# Patient Record
Sex: Male | Born: 1955 | Race: White | Hispanic: Yes | Marital: Single | Smoking: Never smoker
Health system: Southern US, Community
[De-identification: ages and names within clinical notes are randomized; demographics above are authoritative.]

---

## 2007-12-04 ENCOUNTER — Emergency Department (HOSPITAL_COMMUNITY): Admission: EM | Admit: 2007-12-04 | Discharge: 2007-12-04 | Payer: Self-pay | Admitting: Emergency Medicine

## 2007-12-07 ENCOUNTER — Inpatient Hospital Stay (HOSPITAL_COMMUNITY): Admission: EM | Admit: 2007-12-07 | Discharge: 2007-12-11 | Payer: Self-pay | Admitting: Emergency Medicine

## 2007-12-07 ENCOUNTER — Ambulatory Visit: Payer: Self-pay | Admitting: *Deleted

## 2007-12-08 ENCOUNTER — Encounter (INDEPENDENT_AMBULATORY_CARE_PROVIDER_SITE_OTHER): Payer: Self-pay | Admitting: *Deleted

## 2011-05-03 NOTE — Discharge Summary (Signed)
Henry [name], Henry [name]                 ACCOUNT NO.:  [id number]   MEDICAL RECORD NO.:  [id number]          PATIENT TYPE:  INP   LOCATION:  5505                         FACILITY:  [facility]   PHYSICIAN:  Manning Charity, MD     DATE OF BIRTH:  [birth year]   DATE OF ADMISSION:  12/07/2007  DATE OF DISCHARGE:  12/11/2007                               DISCHARGE SUMMARY   DISCHARGE DIAGNOSES:  1. Anion gap metabolic acidosis in the setting of alcohol abuse.  2. Hyponatremia in the setting of alcohol abuse.  3. Hypothermia on admission secondary to alcohol abuse and loss-of-      consciousness outdoors.  4. Self-reported history of heavy alcohol abuse.  5. Transient elevation in creatine kinase, resolved, query mild      rhabdomyolysis.  6. Transaminitis.  7. Elevated creatinine kinase level.  8. Acute renal failure, resolved.  9. Melena.  10.Intermittent right leg pain.   DISCHARGE MEDICATIONS:  1. Thiamine 100 mg p.o. daily.  2. Folate 1 mg p.o. daily.   CONSULTATIONS:  None.   PROCEDURE:  A transthoracic echocardiogram on December 08, 2007,  demonstrated vigorous left ventricular ejection fraction of 65%-75%.  There were no left ventricular regional wall motion abnormalities.  Diastolic function parameters were normal.  The left atrium was mildly  to moderately dilated.   HISTORY:  Henry [name] is a 55 year old [place] man with a history of  chronic alcohol abuse, who presented to the [facility]  Emergency Department one day prior to admission on December 07, 2007,  asking for assistance to quit drinking.  He subsequently left against  medical advice and was found to have an alcohol level of 236 at the  time.  He later went home and drank that evening, after which he  developed nausea and vomiting.  It is unclear by the history if the  patient was found by a friend, but he did present to the hospital by  EMS, after being found down.  The patient denies any  hematemesis,  abdominal pain, chest pain or dyspnea on admission.  At the time of  admission he did endorse right calf pain since the morning of admission  with diarrhea x2 days that was watery without blood.  He had no sick  contacts.  Of note, he did endorse drinking much more than usual on the  night prior to admission.  His last drink was reportedly at 2 a.m. the  morning of admission.   ADMISSION PHYSICAL EXAMINATION:  VITAL SIGNS:  On admission temperature  95.7 degrees F, blood pressure 123/83, up from 67/49 at initial  presentation, pulse 165, respirations 22, oxygen saturation 100% on room  air.  GENERAL:  Mild distress and diaphoretic.  HEENT:  Pupils dilated and minimally reactive.  Extraocular movements  intact.  Head atraumatic.  Oropharynx clear with poor dentition.  NECK:  Supple without adenopathy.  LUNGS:  Good air entry bilaterally.  Clear to auscultation.  CARDIOVASCULAR:  Tachycardic with a regular rhythm.  No audible murmurs,  rubs or gallops.  ABDOMEN:  Soft, nontender, nondistended with  normoactive bowel sounds.  EXTREMITIES:  No edema.  No calf tenderness bilaterally.  NEUROLOGIC:  Alert and oriented x3 and grossly nonfocal.  PSYCH:  Appropriate.   ADMISSION LABORATORY DATA:  Lab studies on the day of admission were  sodium 126, potassium 4.4, chloride less than 70, bicarbonate 9, BUN 21,  creatinine 1.38, glucose 139.  Bilirubin 3.4, (direct 0.9, indirect  2.5), alkaline phosphatase 62, AST 187, ALT 108, protein 5.9, albumin  3.3, calcium 9.3.  White blood cell count 9.3 with an ANC of 6.7,  hemoglobin 12.2, platelet count 125, MCV 95.6.  Lactic acid was  extremely elevated at 21.7.  Pro-time was prolonged at 16.5 seconds but  the INR was normal at 1.3.  Cardiac enzymes were remarkable for a first  set revealing a CK of 2043, CK-MB of 31.5, troponin of 0.26.  A repeat  set revealed CK of 3419, CK-MB of 42.5, troponin 0.05.  Amylase was 55.  The first  arterial blood gas revealed a pH of 7.36, pCO2 of 20, pO2 of  76, base deficit of 12.  Urinalysis revealed a pH of 5, with 15 ketones  and moderate blood.  There were 3-6 red blood cells and 0-2 white blood  cells per high power field.   HOSPITAL COURSE:  #1 - Alcohol abuse presenting as hypotension, nausea,  vomiting, diarrhea, metabolic acidosis and abdominal pain:  The patient  had a somewhat confusing initial picture, presenting with clear  hypotension that was very responsive to fluids, as well as nausea,  vomiting, diarrhea and a prominent metabolic acidosis.  The patient did  endorse an alcohol binge on the night prior to admission, so this was  considered the most likely cause of his symptoms; however, sepsis was  also considered, though considered somewhat less likely, given no  supporting symptoms including abdominal pain, fevers or chills and a  normal white blood cell count.  Cardiogenic shock was felt to be  unlikely, without a history of chest pain, and given his relatively low  risk.  Treatment was initially supportive.  The patient was given  copious intravenous fluids, with a good response of his blood pressure.  His metabolic acidosis resolved very quickly with little intervention.  This was felt to be due to lactic acidosis, in the setting of alcohol  intoxication.  He had no clear signs of ischemia, to suggest the lactate  was coming from another source.  Over the course of his admission, the  patient was given a Librium taper empirically instead of a p.r.n. dose,  and responded well.   #2 - Hyponatremia:  Again, this resolved rapidly after admission.  It  was felt to be secondary to the patient's acute intoxication and alcohol  abuse.  This was corrected with intravenous fluids at a reasonable rate.   #3 - Anemia:  Likely cause of the patient's anemia was thought to be due  to bone marrow suppression, secondary to long-term alcohol abuse;  however, the patient did  note an episode of melena around the time of  admission.  He did not have another bloody or black stool during this  hospitalization, so further GI workup was not felt to be necessary as an  inpatient.  His hemoglobin remained stable throughout his  hospitalization.  He was counseled that cessation of alcohol was the  only way to avoid bone marrow suppression, associated with alcohol.   #4 - Electrolyte abnormalities:  As above.  Refeeding syndrome was a  very  real consideration, given the patient's history of alcohol abuse  and poor p.o. intake.  His electrolytes were repleted as necessary,  including primarily potassium and magnesium, but also phosphorus.  His  electrolytes did stabilize during his admission, and he was discharged  without need for supplementation.   #5 - Alcohol abuse:  See problem number one above.  The patient was  continued on a Librium taper throughout this hospitalization.  He was  offered Social Work consultation in Spanish, to assist with alcohol  cessation and local resources.  He was started on thiamine and folate  prior to the institution of dextrose and IV fluids, and was continued on  thiamine and folate at discharge.   #6 - Question of a cardiac murmur:  There was a question after admission  of a possible murmur.  The patient was not aware of a previous history  of cardiac problems.  A 2-D echocardiogram was obtained because of this  possible murmur and was found to be unremarkable.   DISCHARGE LABORATORY DATA:  Laboratory studies on the day of discharge  revealed a sodium of 133, potassium 3.4 prior to repletion, chloride  105, bicarbonate 24, BUN 4, creatinine 0.66, glucose 118, calcium 7.9,  phosphorus 3.6, magnesium 2.1.  White blood cell count 4.4, hemoglobin  9.7 and stable, platelet count 193.   DISCHARGE VITAL SIGNS:  VITAL SIGNS:  On the day of discharge  temperature 98.9 degrees F, blood pressure 101/65, pulse 90,  respirations 22-24,  oxygen saturation 98% on room air.   DISPOSITION/FOLLOWUP:  The patient is to follow up in the [facility].  Given discharge  around the Christmas holiday, the patient was told that he would be  called with an appointment after this was scheduled.  If he had  questions or concerns, he was informed to call #[phone] and speak  with one of the physicians in the outpatient clinic.      Madelaine Etienne, MD  Electronically Signed      Manning Charity, MD  Electronically Signed    JH/MEDQ  D:  02/23/2008  T:  02/23/2008  Job:  [id number]

## 2011-09-20 LAB — BASIC METABOLIC PANEL
BUN: 12
BUN: 2 — ABNORMAL LOW
BUN: 21
BUN: 4 — ABNORMAL LOW
BUN: 5 — ABNORMAL LOW
BUN: 6
BUN: 8
CO2: 21
CO2: 25
CO2: 26
CO2: 9 — CL
Calcium: 6.2 — CL
Calcium: 6.6 — ABNORMAL LOW
Calcium: 7 — ABNORMAL LOW
Calcium: 7.4 — ABNORMAL LOW
Calcium: 7.6 — ABNORMAL LOW
Calcium: 7.7 — ABNORMAL LOW
Calcium: 7.9 — ABNORMAL LOW
Calcium: 7.9 — ABNORMAL LOW
Calcium: 9.3
Chloride: 100
Chloride: 100
Chloride: 105
Chloride: 106
Chloride: 95 — ABNORMAL LOW
Chloride: <70 — CL
Creatinine, Ser: 0.54
Creatinine, Ser: 0.56
Creatinine, Ser: 0.58
Creatinine, Ser: 0.58
Creatinine, Ser: 0.58
Creatinine, Ser: 0.63
Creatinine, Ser: 0.66
GFR calc Af Amer: >60
GFR calc Af Amer: >60
GFR calc Af Amer: >60
GFR calc Af Amer: >60
GFR calc non Af Amer: 55 — ABNORMAL LOW
GFR calc non Af Amer: >60
GFR calc non Af Amer: >60
GFR calc non Af Amer: >60
GFR calc non Af Amer: >60
GFR calc non Af Amer: >60
GFR calc non Af Amer: >60
GFR calc non Af Amer: >60
GFR calc non Af Amer: >60
Glucose, Bld: 102 — ABNORMAL HIGH
Glucose, Bld: 111 — ABNORMAL HIGH
Glucose, Bld: 113 — ABNORMAL HIGH
Glucose, Bld: 117 — ABNORMAL HIGH
Glucose, Bld: 124 — ABNORMAL HIGH
Glucose, Bld: 94
Potassium: 2.6 — CL
Potassium: 2.9 — ABNORMAL LOW
Potassium: 3 — ABNORMAL LOW
Potassium: 3.6
Sodium: 125 — ABNORMAL LOW
Sodium: 129 — ABNORMAL LOW
Sodium: 135

## 2011-09-20 LAB — COMPREHENSIVE METABOLIC PANEL
ALT: 108 — ABNORMAL HIGH
Albumin: 2.1 — ABNORMAL LOW
Alkaline Phosphatase: 47
Alkaline Phosphatase: 53
BUN: 10
CO2: 27
Calcium: 6.6 — ABNORMAL LOW
Chloride: 105
Glucose, Bld: 107 — ABNORMAL HIGH
Glucose, Bld: 96
Potassium: 2.8 — ABNORMAL LOW
Potassium: 3.2 — ABNORMAL LOW
Sodium: 136
Total Bilirubin: 1.9 — ABNORMAL HIGH
Total Protein: 3.9 — ABNORMAL LOW
Total Protein: 4.2 — ABNORMAL LOW

## 2011-09-20 LAB — I-STAT 8, (EC8 V) (CONVERTED LAB)
BUN: 10
Bicarbonate: 14.5 — ABNORMAL LOW
Bicarbonate: 16.2 — ABNORMAL LOW
Chloride: 104
Glucose, Bld: 135 — ABNORMAL HIGH
Hemoglobin: 13.3
Hemoglobin: 18 — ABNORMAL HIGH
Operator id: 198171
Operator id: 288331
Potassium: 3.6
Potassium: 4.2
Sodium: 116 — CL

## 2011-09-20 LAB — CBC
HCT: 22.6 — ABNORMAL LOW
HCT: 22.7 — ABNORMAL LOW
HCT: 23.3 — ABNORMAL LOW
HCT: 47.9
Hemoglobin: 8 — ABNORMAL LOW
Hemoglobin: 8 — ABNORMAL LOW
Hemoglobin: 8.2 — ABNORMAL LOW
MCHC: 34.2
MCHC: 34.7
MCHC: 34.9
MCHC: 35.2
MCV: 92.6
MCV: 93.7
MCV: 94
MCV: 94
MCV: 95.6
MCV: 96.8
Platelets: 143 — ABNORMAL LOW
RBC: 2.41 — ABNORMAL LOW
RBC: 2.43 — ABNORMAL LOW
RBC: 2.51 — ABNORMAL LOW
RBC: 2.94 — ABNORMAL LOW
RBC: 5.17
RDW: 13.2
RDW: 13.6
RDW: 14.1
RDW: 14.2
RDW: 15.1
WBC: 2.9 — ABNORMAL LOW
WBC: 3.5 — ABNORMAL LOW
WBC: 3.7 — ABNORMAL LOW
WBC: 4.4
WBC: 9.3

## 2011-09-20 LAB — CK TOTAL AND CKMB (NOT AT ARMC)
CK, MB: 31.5 — ABNORMAL HIGH
Total CK: 2043 — ABNORMAL HIGH

## 2011-09-20 LAB — HEPATIC FUNCTION PANEL
AST: 187 — ABNORMAL HIGH
Albumin: 3.3 — ABNORMAL LOW
Alkaline Phosphatase: 66
Bilirubin, Direct: 0.9 — ABNORMAL HIGH
Indirect Bilirubin: 2.5 — ABNORMAL HIGH
Total Bilirubin: 3.4 — ABNORMAL HIGH

## 2011-09-20 LAB — DIFFERENTIAL
Basophils Absolute: 0
Basophils Absolute: 0
Basophils Relative: 0
Basophils Relative: 0
Eosinophils Relative: 0
Lymphocytes Relative: 12
Lymphs Abs: 0.8
Lymphs Abs: 2.1
Monocytes Absolute: 0.4
Monocytes Relative: 5
Neutro Abs: 5.9
Neutrophils Relative %: 72

## 2011-09-20 LAB — POCT CARDIAC MARKERS
Operator id: 198171
Troponin i, poc: <0.05

## 2011-09-20 LAB — HIV ANTIBODY (ROUTINE TESTING W REFLEX): HIV: NONREACTIVE

## 2011-09-20 LAB — POCT I-STAT CREATININE
Creatinine, Ser: 0.6
Operator id: 198171

## 2011-09-20 LAB — URINE MICROSCOPIC-ADD ON

## 2011-09-20 LAB — SODIUM, URINE, RANDOM
Sodium, Ur: 41
Sodium, Ur: 44

## 2011-09-20 LAB — POCT I-STAT 3, ART BLOOD GAS (G3+)
Acid-base deficit: 12 — ABNORMAL HIGH
TCO2: 12

## 2011-09-20 LAB — TROPONIN I: Troponin I: 0.26 — ABNORMAL HIGH

## 2011-09-20 LAB — URINALYSIS, ROUTINE W REFLEX MICROSCOPIC
Bilirubin Urine: NEGATIVE
Glucose, UA: 100 — AB
Ketones, ur: 15 — AB
Nitrite: NEGATIVE
Protein, ur: NEGATIVE
Urobilinogen, UA: 1
pH: 5

## 2011-09-20 LAB — PHOSPHORUS
Phosphorus: 1.3 — ABNORMAL LOW
Phosphorus: 1.8 — ABNORMAL LOW
Phosphorus: 3.6

## 2011-09-20 LAB — DIC (DISSEMINATED INTRAVASCULAR COAGULATION)PANEL
D-Dimer, Quant: 3.81 — ABNORMAL HIGH
INR: 1.3
Smear Review: NONE SEEN
aPTT: 28

## 2011-09-20 LAB — CARDIAC PANEL(CRET KIN+CKTOT+MB+TROPI)
CK, MB: 28 — ABNORMAL HIGH
CK, MB: 42.5 — ABNORMAL HIGH
Relative Index: 0.8
Relative Index: 1.2
Total CK: 3418 — ABNORMAL HIGH
Troponin I: 0.05

## 2011-09-20 LAB — ETHANOL: Alcohol, Ethyl (B): 29 — ABNORMAL HIGH

## 2011-09-20 LAB — LACTATE DEHYDROGENASE, ISOENZYMES
LDH 2: 31 %Total (ref 29–42)
LDH 3: 22 %Total (ref 16–22)
LDH Isoenzymes, Total: 371 U/L — ABNORMAL HIGH (ref 105–230)

## 2011-09-20 LAB — OSMOLALITY: Osmolality: 261 — ABNORMAL LOW

## 2011-09-20 LAB — RAPID URINE DRUG SCREEN, HOSP PERFORMED: Tetrahydrocannabinol: NOT DETECTED

## 2011-09-20 LAB — APTT: aPTT: 27

## 2011-09-20 LAB — HAPTOGLOBIN: Haptoglobin: 148

## 2011-09-20 LAB — TYPE AND SCREEN

## 2011-09-20 LAB — HEMOGLOBIN: Hemoglobin: 7.9 — CL

## 2011-09-20 LAB — TECHNOLOGIST SMEAR REVIEW

## 2011-09-20 LAB — MAGNESIUM
Magnesium: 2.1
Magnesium: 2.4
Magnesium: 2.6 — ABNORMAL HIGH

## 2011-09-20 LAB — PROTIME-INR
INR: 1.3
Prothrombin Time: 16.5 — ABNORMAL HIGH

## 2011-09-20 LAB — FERRITIN: Ferritin: 818 — ABNORMAL HIGH (ref 22–322)

## 2011-09-20 LAB — RETICULOCYTES
Retic Count, Absolute: 92.5
Retic Ct Pct: 3.7 — ABNORMAL HIGH

## 2011-09-20 LAB — IRON AND TIBC

## 2011-09-20 LAB — OSMOLALITY, URINE: Osmolality, Ur: 580

## 2011-09-20 LAB — ABO/RH: ABO/RH(D): A POS

## 2011-09-20 LAB — ACETAMINOPHEN LEVEL: Acetaminophen (Tylenol), Serum: <10 — ABNORMAL LOW

## 2011-09-20 LAB — CREATININE, URINE, RANDOM: Creatinine, Urine: 54.4

## 2011-09-20 LAB — FOLATE: Folate: >20

## 2011-09-20 LAB — CHLORIDE, URINE, RANDOM: Chloride Urine: 9

## 2019-12-02 ENCOUNTER — Encounter (HOSPITAL_COMMUNITY): Payer: Self-pay | Admitting: Emergency Medicine

## 2019-12-02 ENCOUNTER — Other Ambulatory Visit: Payer: Self-pay

## 2019-12-02 ENCOUNTER — Inpatient Hospital Stay (HOSPITAL_COMMUNITY)
Admission: EM | Admit: 2019-12-02 | Discharge: 2020-01-17 | DRG: 207 | Disposition: E | Payer: HRSA Program | Attending: Pulmonary Disease | Admitting: Pulmonary Disease

## 2019-12-02 ENCOUNTER — Emergency Department (HOSPITAL_COMMUNITY): Payer: HRSA Program

## 2019-12-02 DIAGNOSIS — J690 Pneumonitis due to inhalation of food and vomit: Secondary | ICD-10-CM | POA: Diagnosis not present

## 2019-12-02 DIAGNOSIS — R140 Abdominal distension (gaseous): Secondary | ICD-10-CM

## 2019-12-02 DIAGNOSIS — R748 Abnormal levels of other serum enzymes: Secondary | ICD-10-CM | POA: Diagnosis present

## 2019-12-02 DIAGNOSIS — T4275XA Adverse effect of unspecified antiepileptic and sedative-hypnotic drugs, initial encounter: Secondary | ICD-10-CM | POA: Diagnosis not present

## 2019-12-02 DIAGNOSIS — E876 Hypokalemia: Secondary | ICD-10-CM

## 2019-12-02 DIAGNOSIS — Z6830 Body mass index (BMI) 30.0-30.9, adult: Secondary | ICD-10-CM | POA: Diagnosis not present

## 2019-12-02 DIAGNOSIS — R739 Hyperglycemia, unspecified: Secondary | ICD-10-CM

## 2019-12-02 DIAGNOSIS — Z789 Other specified health status: Secondary | ICD-10-CM | POA: Diagnosis not present

## 2019-12-02 DIAGNOSIS — J1282 Pneumonia due to coronavirus disease 2019: Secondary | ICD-10-CM | POA: Diagnosis present

## 2019-12-02 DIAGNOSIS — L89322 Pressure ulcer of left buttock, stage 2: Secondary | ICD-10-CM | POA: Diagnosis not present

## 2019-12-02 DIAGNOSIS — E873 Alkalosis: Secondary | ICD-10-CM | POA: Diagnosis present

## 2019-12-02 DIAGNOSIS — J80 Acute respiratory distress syndrome: Secondary | ICD-10-CM | POA: Diagnosis present

## 2019-12-02 DIAGNOSIS — I272 Pulmonary hypertension, unspecified: Secondary | ICD-10-CM | POA: Diagnosis present

## 2019-12-02 DIAGNOSIS — R6521 Severe sepsis with septic shock: Secondary | ICD-10-CM | POA: Diagnosis not present

## 2019-12-02 DIAGNOSIS — E669 Obesity, unspecified: Secondary | ICD-10-CM | POA: Diagnosis present

## 2019-12-02 DIAGNOSIS — U071 COVID-19: Secondary | ICD-10-CM | POA: Diagnosis present

## 2019-12-02 DIAGNOSIS — E11649 Type 2 diabetes mellitus with hypoglycemia without coma: Secondary | ICD-10-CM | POA: Diagnosis not present

## 2019-12-02 DIAGNOSIS — Z6824 Body mass index (BMI) 24.0-24.9, adult: Secondary | ICD-10-CM | POA: Diagnosis not present

## 2019-12-02 DIAGNOSIS — L89312 Pressure ulcer of right buttock, stage 2: Secondary | ICD-10-CM | POA: Diagnosis not present

## 2019-12-02 DIAGNOSIS — E440 Moderate protein-calorie malnutrition: Secondary | ICD-10-CM | POA: Diagnosis present

## 2019-12-02 DIAGNOSIS — T17908A Unspecified foreign body in respiratory tract, part unspecified causing other injury, initial encounter: Secondary | ICD-10-CM

## 2019-12-02 DIAGNOSIS — J9601 Acute respiratory failure with hypoxia: Secondary | ICD-10-CM

## 2019-12-02 DIAGNOSIS — D696 Thrombocytopenia, unspecified: Secondary | ICD-10-CM | POA: Diagnosis not present

## 2019-12-02 DIAGNOSIS — Z66 Do not resuscitate: Secondary | ICD-10-CM | POA: Diagnosis not present

## 2019-12-02 DIAGNOSIS — R040 Epistaxis: Secondary | ICD-10-CM | POA: Diagnosis not present

## 2019-12-02 DIAGNOSIS — J9600 Acute respiratory failure, unspecified whether with hypoxia or hypercapnia: Secondary | ICD-10-CM | POA: Diagnosis not present

## 2019-12-02 DIAGNOSIS — R1310 Dysphagia, unspecified: Secondary | ICD-10-CM | POA: Diagnosis not present

## 2019-12-02 DIAGNOSIS — Z978 Presence of other specified devices: Secondary | ICD-10-CM

## 2019-12-02 DIAGNOSIS — I998 Other disorder of circulatory system: Secondary | ICD-10-CM | POA: Diagnosis not present

## 2019-12-02 DIAGNOSIS — Z01818 Encounter for other preprocedural examination: Secondary | ICD-10-CM

## 2019-12-02 DIAGNOSIS — E162 Hypoglycemia, unspecified: Secondary | ICD-10-CM | POA: Diagnosis not present

## 2019-12-02 DIAGNOSIS — J1289 Other viral pneumonia: Secondary | ICD-10-CM

## 2019-12-02 DIAGNOSIS — E1165 Type 2 diabetes mellitus with hyperglycemia: Secondary | ICD-10-CM | POA: Diagnosis present

## 2019-12-02 DIAGNOSIS — J156 Pneumonia due to other aerobic Gram-negative bacteria: Secondary | ICD-10-CM | POA: Diagnosis not present

## 2019-12-02 DIAGNOSIS — I959 Hypotension, unspecified: Secondary | ICD-10-CM | POA: Diagnosis not present

## 2019-12-02 DIAGNOSIS — IMO0002 Reserved for concepts with insufficient information to code with codable children: Secondary | ICD-10-CM | POA: Diagnosis present

## 2019-12-02 DIAGNOSIS — R34 Anuria and oliguria: Secondary | ICD-10-CM | POA: Diagnosis not present

## 2019-12-02 DIAGNOSIS — Y95 Nosocomial condition: Secondary | ICD-10-CM | POA: Diagnosis not present

## 2019-12-02 DIAGNOSIS — I10 Essential (primary) hypertension: Secondary | ICD-10-CM

## 2019-12-02 DIAGNOSIS — R0902 Hypoxemia: Secondary | ICD-10-CM

## 2019-12-02 DIAGNOSIS — R0602 Shortness of breath: Secondary | ICD-10-CM

## 2019-12-02 DIAGNOSIS — E118 Type 2 diabetes mellitus with unspecified complications: Secondary | ICD-10-CM | POA: Diagnosis not present

## 2019-12-02 DIAGNOSIS — Z23 Encounter for immunization: Secondary | ICD-10-CM

## 2019-12-02 DIAGNOSIS — L8990 Pressure ulcer of unspecified site, unspecified stage: Secondary | ICD-10-CM | POA: Insufficient documentation

## 2019-12-02 DIAGNOSIS — R578 Other shock: Secondary | ICD-10-CM | POA: Diagnosis not present

## 2019-12-02 DIAGNOSIS — Z452 Encounter for adjustment and management of vascular access device: Secondary | ICD-10-CM

## 2019-12-02 DIAGNOSIS — E875 Hyperkalemia: Secondary | ICD-10-CM | POA: Diagnosis not present

## 2019-12-02 DIAGNOSIS — E870 Hyperosmolality and hypernatremia: Secondary | ICD-10-CM | POA: Diagnosis not present

## 2019-12-02 DIAGNOSIS — E872 Acidosis: Secondary | ICD-10-CM | POA: Diagnosis not present

## 2019-12-02 DIAGNOSIS — A419 Sepsis, unspecified organism: Secondary | ICD-10-CM | POA: Diagnosis not present

## 2019-12-02 DIAGNOSIS — J9690 Respiratory failure, unspecified, unspecified whether with hypoxia or hypercapnia: Secondary | ICD-10-CM

## 2019-12-02 DIAGNOSIS — Z79899 Other long term (current) drug therapy: Secondary | ICD-10-CM

## 2019-12-02 DIAGNOSIS — E1169 Type 2 diabetes mellitus with other specified complication: Secondary | ICD-10-CM | POA: Diagnosis not present

## 2019-12-02 LAB — HEMOGLOBIN A1C
Hgb A1c MFr Bld: 7.5 % — ABNORMAL HIGH (ref 4.8–5.6)
Mean Plasma Glucose: 168.55 mg/dL

## 2019-12-02 LAB — LACTIC ACID, PLASMA
Lactic Acid, Venous: 3.2 mmol/L (ref 0.5–1.9)
Lactic Acid, Venous: 2 mmol/L (ref 0.5–1.9)

## 2019-12-02 LAB — COMPREHENSIVE METABOLIC PANEL
ALT: 97 U/L — ABNORMAL HIGH (ref 0–44)
AST: 96 U/L — ABNORMAL HIGH (ref 15–41)
Albumin: 3.1 g/dL — ABNORMAL LOW (ref 3.5–5.0)
Alkaline Phosphatase: 60 U/L (ref 38–126)
Anion gap: 13 (ref 5–15)
BUN: <5 mg/dL — ABNORMAL LOW (ref 8–23)
CO2: 26 mmol/L (ref 22–32)
Calcium: 8 mg/dL — ABNORMAL LOW (ref 8.9–10.3)
Chloride: 98 mmol/L (ref 98–111)
Creatinine, Ser: 0.76 mg/dL (ref 0.61–1.24)
GFR calc Af Amer: >60 mL/min (ref >60)
GFR calc non Af Amer: >60 mL/min (ref >60)
Glucose, Bld: 227 mg/dL — ABNORMAL HIGH (ref 70–99)
Potassium: 3.1 mmol/L — ABNORMAL LOW (ref 3.5–5.1)
Sodium: 137 mmol/L (ref 135–145)
Total Bilirubin: 1 mg/dL (ref 0.3–1.2)
Total Protein: 7.1 g/dL (ref 6.5–8.1)

## 2019-12-02 LAB — FIBRINOGEN: Fibrinogen: 716 mg/dL — ABNORMAL HIGH (ref 210–475)

## 2019-12-02 LAB — CBC WITH DIFFERENTIAL/PLATELET
Abs Immature Granulocytes: 0.05 10*3/uL (ref 0.00–0.07)
Basophils Absolute: 0 10*3/uL (ref 0.0–0.1)
Basophils Relative: 0 %
Eosinophils Absolute: 0 10*3/uL (ref 0.0–0.5)
Eosinophils Relative: 0 %
HCT: 51.2 % (ref 39.0–52.0)
Hemoglobin: 17.5 g/dL — ABNORMAL HIGH (ref 13.0–17.0)
Immature Granulocytes: 1 %
Lymphocytes Relative: 16 %
Lymphs Abs: 1.1 10*3/uL (ref 0.7–4.0)
MCH: 29.2 pg (ref 26.0–34.0)
MCHC: 34.2 g/dL (ref 30.0–36.0)
MCV: 85.3 fL (ref 80.0–100.0)
Monocytes Absolute: 0.2 10*3/uL (ref 0.1–1.0)
Monocytes Relative: 3 %
Neutro Abs: 5.4 10*3/uL (ref 1.7–7.7)
Neutrophils Relative %: 80 %
Platelets: 167 10*3/uL (ref 150–400)
RBC: 6 MIL/uL — ABNORMAL HIGH (ref 4.22–5.81)
RDW: 12.4 % (ref 11.5–15.5)
WBC: 6.8 10*3/uL (ref 4.0–10.5)
nRBC: 0 % (ref 0.0–0.2)

## 2019-12-02 LAB — TRIGLYCERIDES: Triglycerides: 77 mg/dL (ref <150)

## 2019-12-02 LAB — LACTATE DEHYDROGENASE: LDH: 458 U/L — ABNORMAL HIGH (ref 98–192)

## 2019-12-02 LAB — CBG MONITORING, ED
Glucose-Capillary: 158 mg/dL — ABNORMAL HIGH (ref 70–99)
Glucose-Capillary: 198 mg/dL — ABNORMAL HIGH (ref 70–99)

## 2019-12-02 LAB — POC SARS CORONAVIRUS 2 AG -  ED: SARS Coronavirus 2 Ag: POSITIVE — AB

## 2019-12-02 LAB — PROCALCITONIN: Procalcitonin: 0.17 ng/mL

## 2019-12-02 LAB — C-REACTIVE PROTEIN: CRP: 11.6 mg/dL — ABNORMAL HIGH (ref <1.0)

## 2019-12-02 LAB — FERRITIN: Ferritin: 823 ng/mL — ABNORMAL HIGH (ref 24–336)

## 2019-12-02 LAB — D-DIMER, QUANTITATIVE: D-Dimer, Quant: 2.18 ug/mL-FEU — ABNORMAL HIGH (ref 0.00–0.50)

## 2019-12-02 LAB — HIV ANTIBODY (ROUTINE TESTING W REFLEX): HIV Screen 4th Generation wRfx: NONREACTIVE

## 2019-12-02 MED ORDER — SODIUM CHLORIDE 0.9% FLUSH
3.0000 mL | Freq: Once | INTRAVENOUS | Status: AC
  Administered 2019-12-02: 13:00:00 3 mL via INTRAVENOUS

## 2019-12-02 MED ORDER — SODIUM CHLORIDE 0.9 % IV BOLUS
1000.0000 mL | Freq: Once | INTRAVENOUS | Status: AC
  Administered 2019-12-02: 12:00:00 1000 mL via INTRAVENOUS

## 2019-12-02 MED ORDER — ACETAMINOPHEN 325 MG PO TABS
650.0000 mg | ORAL_TABLET | Freq: Four times a day (QID) | ORAL | Status: DC | PRN
  Administered 2019-12-17 – 2020-01-04 (×6): 650 mg via ORAL
  Filled 2019-12-02 (×7): qty 2

## 2019-12-02 MED ORDER — ONDANSETRON HCL 4 MG PO TABS: 4.0000 mg | ORAL_TABLET | Freq: Four times a day (QID) | ORAL | Status: DC | PRN

## 2019-12-02 MED ORDER — ENOXAPARIN SODIUM 40 MG/0.4ML ~~LOC~~ SOLN
40.0000 mg | SUBCUTANEOUS | Status: DC
  Administered 2019-12-02: 40 mg via SUBCUTANEOUS
  Filled 2019-12-02: qty 0.4

## 2019-12-02 MED ORDER — DEXAMETHASONE SODIUM PHOSPHATE 10 MG/ML IJ SOLN
6.0000 mg | INTRAMUSCULAR | Status: DC
  Administered 2019-12-02 – 2019-12-05 (×4): 6 mg via INTRAVENOUS
  Filled 2019-12-02 (×4): qty 1

## 2019-12-02 MED ORDER — SODIUM CHLORIDE 0.9 % IV SOLN
100.0000 mg | Freq: Every day | INTRAVENOUS | Status: AC
  Administered 2019-12-03 – 2019-12-06 (×4): 100 mg via INTRAVENOUS
  Filled 2019-12-02 (×5): qty 20

## 2019-12-02 MED ORDER — SODIUM CHLORIDE 0.9 % IV SOLN
200.0000 mg | Freq: Once | INTRAVENOUS | Status: AC
  Administered 2019-12-02: 16:00:00 200 mg via INTRAVENOUS
  Filled 2019-12-02: qty 200

## 2019-12-02 MED ORDER — ONDANSETRON HCL 4 MG/2ML IJ SOLN: 4.0000 mg | Freq: Four times a day (QID) | INTRAMUSCULAR | Status: DC | PRN

## 2019-12-02 MED ORDER — POTASSIUM CHLORIDE CRYS ER 20 MEQ PO TBCR
40.0000 meq | EXTENDED_RELEASE_TABLET | Freq: Two times a day (BID) | ORAL | Status: AC
  Administered 2019-12-02 (×2): 40 meq via ORAL
  Filled 2019-12-02 (×2): qty 2

## 2019-12-02 NOTE — ED Notes (Signed)
This RN saw pt's 02 sat drop on tele monitor. Went in to assess pt and pt was 69% on 10L [place] with sustained low reading, and good pleth. Maxed out 15L [place] without any improvement. Placed pt on nonrebreather and titrated 02 until pt maxed out on 15L. Pt's 02 sat now 87-89%. Let primary RN know

## 2019-12-02 NOTE — ED Triage Notes (Signed)
Information obtained through use of interpretor phone-- speaks only spanish   Pt to ED with c/o body aches, abd pain, shortness of breath, no appetite,   On room air- sats were 71-74% placed on 4L/O2 /[place] -- sats increased to 86 on 4L/m/[place]

## 2019-12-02 NOTE — H&P (Signed)
Date: 12/01/2019               Patient Name:  Henry [name] MRN: [id number]  DOB: 1956/11/27 Age / Sex: 63 y.o., male   PCP: Patient, No Pcp Per         Medical Service: Internal Medicine Teaching Service         Attending Physician: Dr. Inez Catalina, MD    First Contact: Dr. Sande Brothers Pager: [phone]  Second Contact: Dr. Nedra Hai Pager: [phone]       After Hours (After 5p/  First Contact Pager: [phone]  weekends / holidays): Second Contact Pager: [phone]   Chief Complaint: [facility]  History of Present Illness: Henry [name] is a 63 year old male with no significant past medical history who presented to the emergency department with progressive shortness of breath five days duration. History was obtained with the assistance of a Spanish interpreter and through chart review.  Approximately five days ago the patient noticed fatigue and exertional dyspnea. He then developed a productive cough. His shortness of breath continued to progress from exertional dyspnea to shortness of breath at rest. He then developed myalgias and arthralgias. He states that he was feeling so bad that he had difficulty tolerating PO intake. He did not tried any over-the-counter medications and is not on any prescribed medications. He states that he does not think he has had any exposure to anyone with COVID however he works outside and is constantly around people. He denies headaches, fevers/chills, sinus congestion, chest pain, nausea/vomiting, diarrhea.  Meds: Patient is not on any over-the-counter or prescription medications.  Allergies: Allergies as of 11/20/2019  . (Not on File)   History reviewed. No pertinent past medical history.  Family History: patient denies a family history of hypertension, diabetes, kidney disease, or heart disease.  Social History: Patient lives alone. 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 He does not have any children. He has never been married. He denies the use of  alcohol, tobacco, or illicit substances.  Review of Systems: A complete ROS was negative except as per HPI.   Physical Exam: Blood pressure 135/84, pulse 89, temperature 97.8 F (36.6 C), temperature source Oral, resp. rate (!) 34, SpO2 90 %.  General: Obese male in no acute distress HENT: Normocephalic, atraumatic, moist mucus membranes Pulm: Good air movement with diffuse crackles and using accessory muscles CV: Tachycardic but regular rhythm, no murmurs, no rubs  Abdomen: Active bowel sounds, soft, non-distended, no tenderness to palpation  Extremities: Pulses palpable in all extremities, no LE edema  Skin: Warm and dry  Neuro: Alert and oriented x 3  EKG: personally reviewed my interpretation is sinus rhythm with normal axis and intervals. No acute ST segment changes. No prior EKG for comparison.  Assessment & Plan by Problem: Active Problems:   Acute respiratory failure with hypoxia (HCC)   COVID-19   Hyperglycemia  Henry [name] is a 63 year old male with no significant past medical history who presented to the emergency department with progressive shortness of breath five days duration. He was found to be hypoxic on room air and subsequent testing returned positive for COVID-19. Given his acute hypoxic respiratory failure he was subsequently admitted for further evaluation and management.  Acute hypoxic respiratory failure secondary to COVID-19 - Currently saturating >90% on 4L per minute nasal cannula - Chest x-ray with bilateral opacities/infiltrates and chronically elevated right hemidiaphragm  - Inflammatory markers elevated - Will start Remdesivir and dexamethasone  Hypokalemia  - Replace  - Repeat BMP tomorrow  Hyperglycemia  - Check A1c  - CBG checks   Diet: Regular  VTE ppx: Lovenox  CODE STATUS: Full Code  Dispo: Admit patient to Inpatient with expected length of stay greater than 2 midnights.  SignedIna Homes, MD 12/06/2019, 3:02 PM  Pager:  [phone]

## 2019-12-02 NOTE — ED Notes (Signed)
Spoke with admitting MD about pt's O2 sat being in the low 80's on 6L. MD suggested bumping him up to 8L or higher to get pt's O2 sat in the mid 80's at least. This nurse also notified Josh from Respiratory. Marland Kitchen

## 2019-12-02 NOTE — ED Provider Notes (Addendum)
[place] EMERGENCY DEPARTMENT Provider Note   CSN: [id number] Arrival date & time: December 19, 2019  1000     History Chief Complaint  Patient presents with  . covid symptoms  . Shortness of Breath  . Abdominal Pain    Henry [name] is a 63 y.o. male with no known past medical history presents to emergency department today with chief complaint of shortness of breath x 4 days.  He states his shortness of breath has been constant but is gotten worse over the last 2 days.  He has shortness of breath at rest and with exertion.  He also has productive cough with white phlegm.  He endorses generalized fatigue and abdominal pain.  He is unable to further describe the abdominal pain.  He states he just aches all over. He has decreased PO intake since symptom onset because he feels so poorly.  He has not taken any medications for symptoms prior to arrival. He denies any sick contacts or any contact with someone known positive for COVID-19. However he works outside and is unsure if he has any sick coworkers.  He denies fever, chills, congestion, chest pain, nausea, vomiting, urinary symptoms, diarrhea.  Due to language barrier, a video interpreter was present during the history-taking and subsequent discussion (and for part of the physical exam) with this patient.   History reviewed. No pertinent past medical history.  There are no problems to display for this patient.   History reviewed. No pertinent surgical history.     No family history on file.  Social History   Tobacco Use  . Smoking status: Not on file  Substance Use Topics  . Alcohol use: Not Currently  . Drug use: Never    Home Medications Prior to Admission medications   Not on File    Allergies    Patient has no allergy information on record.  Review of Systems   Review of Systems All other systems are reviewed and are negative for acute change except as noted in the HPI.  Physical Exam Updated Vital  Signs BP 136/89 (BP Location: Left Arm)   Pulse (!) 111   Temp 97.8 F (36.6 C) (Oral)   Resp (!) 30   SpO2 (!) 71% Comment: pt placed on O2   Physical Exam Vitals and nursing note reviewed.  Constitutional:      General: He is in acute distress.     Appearance: He is not ill-appearing or diaphoretic.  HENT:     Head: Normocephalic and atraumatic.     Right Ear: Tympanic membrane and external ear normal.     Left Ear: Tympanic membrane and external ear normal.     Nose: Nose normal.     Mouth/Throat:     Mouth: Mucous membranes are moist.     Pharynx: Oropharynx is clear.  Eyes:     General: No scleral icterus.       Right eye: No discharge.        Left eye: No discharge.     Extraocular Movements: Extraocular movements intact.     Conjunctiva/sclera: Conjunctivae normal.     Pupils: Pupils are equal, round, and reactive to light.  Neck:     Vascular: No JVD.  Cardiovascular:     Rate and Rhythm: Normal rate and regular rhythm.     Pulses: Normal pulses.          Radial pulses are 2+ on the right side and 2+ on the left side.  Heart sounds: Normal heart sounds.  Pulmonary:     Comments: Lung sounds diminished throughout.  SpO2 is 90% on 4 L nasal cannula.  He had acute respiratory distress.  He is speaking in short sentences.  He has accessory muscle use. Abdominal:     General: Bowel sounds are normal.     Comments: Abdomen is soft, non-distended, generalized abdominal tenderness. No rigidity, no guarding. No peritoneal signs.  Normoactive bowel sounds  Musculoskeletal:        General: Normal range of motion.     Cervical back: Normal range of motion.  Skin:    General: Skin is warm and dry.     Capillary Refill: Capillary refill takes less than 2 seconds.  Neurological:     Mental Status: He is oriented to person, place, and time.     GCS: GCS eye subscore is 4. GCS verbal subscore is 5. GCS motor subscore is 6.     Comments: Fluent speech, no facial droop.    Psychiatric:        Behavior: Behavior normal.     ED Results / Procedures / Treatments   Labs (all labs ordered are listed, but only abnormal results are displayed) Labs Reviewed  LACTIC ACID, PLASMA - Abnormal; Notable for the following components:      Result Value   Lactic Acid, Venous 3.2 (*)    All other components within normal limits  LACTIC ACID, PLASMA  COMPREHENSIVE METABOLIC PANEL  CBC WITH DIFFERENTIAL/PLATELET  URINALYSIS, ROUTINE W REFLEX MICROSCOPIC  POC SARS CORONAVIRUS 2 AG -  ED    EKG None  Radiology DG Chest Portable 1 View  Result Date: 02-May-2019 CLINICAL DATA:  COVID symptoms EXAM: PORTABLE CHEST 1 VIEW COMPARISON:  12/07/2007 FINDINGS: The heart size and mediastinal contours are within normal limits. Unchanged elevation of the right hemidiaphragm. Subtle bilateral heterogeneous and interstitial airspace opacity. The visualized skeletal structures are unremarkable. IMPRESSION: 1. Subtle bilateral heterogeneous and interstitial airspace opacity, consistent with multifocal infection and COVID-19 if clinically suspected. 2. Unchanged elevation of the right hemidiaphragm. Electronically Signed   By: Lauralyn PrimesAlex  Bibbey M.D.   On: 02-May-2019 10:55    Procedures .Critical Care Performed by: Sherene SiresAlbrizze, Avalon Coppinger E, PA-C Authorized by: Sherene SiresAlbrizze, Jaymen Fetch E, PA-C   Critical care provider statement:    Critical care time (minutes):  37   Critical care time was exclusive of:  Separately billable procedures and treating other patients and teaching time   Critical care was necessary to treat or prevent imminent or life-threatening deterioration of the following conditions: Acute respiratory failure with hypoxia due to Covid infection.   Critical care was time spent personally by me on the following activities:  Discussions with consultants, development of treatment plan with patient or surrogate, obtaining history from patient or surrogate, examination of patient, evaluation  of patient's response to treatment, ordering and performing treatments and interventions, ordering and review of laboratory studies, ordering and review of radiographic studies, pulse oximetry, re-evaluation of patient's condition, review of old charts and blood draw for specimens   I assumed direction of critical care for this patient from another provider in my specialty: no     (including critical care time)  Medications Ordered in ED Medications  sodium chloride flush (NS) 0.9 % injection 3 mL (has no administration in time range)  sodium chloride 0.9 % bolus 1,000 mL (has no administration in time range)    ED Course  I have reviewed the triage vital signs and  the nursing notes.  Pertinent labs & imaging results that were available during my care of the patient were reviewed by me and considered in my medical decision making (see chart for details).  Clinical Course as of Dec 02 1423  Thu 12/14/2019  1124 IVF started  Lactic Acid, Venous(!!): 3.2 [KA]  1124 Bilateral heterogeneous and interstitial airspace opacity   DG Chest Portable 1 View [KA]  1130 CMP with mild hypokalemia potassium 3.1.  Elevated glucose at 227.  Creatinine is normal, BUN is less than 5.  Elevated liver enzymes.  Normal anion gap.  Comprehensive metabolic panel(!) [KA]  1132 SARS Coronavirus 2 Ag(!): POSITIVE [KA]  1132 No leukocytosis, no anemia.  WBC: 6.8 [KA]    Clinical Course User Index [KA] Heron Pitcock, Caroleen Hamman, PA-C   MDM Rules/Calculators/A&P                     Patient seen and examined upon arrival to ED. He is afebrile. He appears to be in respiratory distress.  He was hypoxic to 70% on room air.  He was placed on 4 L nasal cannula with improvement to 90%.  During my exam his SpO2 ranged from 88 to 91%.  He is speaking in short sentences with accessory muscle use.  He has lung sounds diminished throughout.  He also has generalized abdominal tenderness without peritoneal signs. Labs ordered in  triage.  Significant lab findings as documented in ED course above.  Elevated lactic acid, IVF started. Covid admission labs added on.  I personally viewed x-ray which shows lateral opacities concerning for Covid pneumonia. Patient does not have PCP will plan for unassigned admission for acute respiratory failure with hypoxia due to Covid. Case discussed with internal medicine service who agrees to admit patient to the hospital and assume his care.  They will follow up on Covid admission labs. Findings and plan of care discussed with supervising physician Dr. Rosalia Hammers.   Avonte Raisanen was evaluated in Emergency Department on 12/14/2019 for the symptoms described in the history of present illness. He was evaluated in the context of the global COVID-19 pandemic, which necessitated consideration that the patient might be at risk for infection with the SARS-CoV-2 virus that causes COVID-19. Institutional protocols and algorithms that pertain to the evaluation of patients at risk for COVID-19 are in a state of rapid change based on information released by regulatory bodies including the CDC and federal and state organizations. These policies and algorithms were followed during the patient's care in the ED.   Portions of this note were generated with Scientist, clinical (histocompatibility and immunogenetics). Dictation errors may occur despite best attempts at proofreading.   Final Clinical Impression(s) / ED Diagnoses Final diagnoses:  COVID-19  Acute respiratory failure due to COVID-19 [facility])    Rx / DC Orders ED Discharge Orders    None       Sherene Sires, PA-C 12-14-19 1426    Kathyrn Lass 2019/12/14 Tye Savoy, MD 12/03/19 1212

## 2019-12-02 NOTE — ED Notes (Signed)
Dinner Tray Ordered @ 1819. 

## 2019-12-02 NOTE — Progress Notes (Signed)
Paged by RN and informed patient had desaturated to the 60s at one point and was placed on NRB @ 15L with improvement of O2 sats to high 80s. Patient has had rapid progression of oxygen demands from 4L [place] this morning to 8L HFNC earlier today and now to 15L NRB. Concern for rapidly progressing COVID-19 PNA at risk for requiring ICU level care if ability to oxygenate continues to deteriorate. - Discussed case with E-Link physician and a PCCM provider will evaluate - Continue to maintain saturations >85%, and monitor for symptoms - Nursing to work with patient to help him prone - Continue Remdesivir and dexamethasone

## 2019-12-03 ENCOUNTER — Inpatient Hospital Stay (HOSPITAL_COMMUNITY): Payer: HRSA Program

## 2019-12-03 DIAGNOSIS — J9601 Acute respiratory failure with hypoxia: Secondary | ICD-10-CM | POA: Diagnosis present

## 2019-12-03 DIAGNOSIS — J9600 Acute respiratory failure, unspecified whether with hypoxia or hypercapnia: Secondary | ICD-10-CM

## 2019-12-03 LAB — URINALYSIS, ROUTINE W REFLEX MICROSCOPIC
Bilirubin Urine: NEGATIVE
Glucose, UA: 50 mg/dL — AB
Hgb urine dipstick: NEGATIVE
Ketones, ur: 5 mg/dL — AB
Leukocytes,Ua: NEGATIVE
Nitrite: NEGATIVE
Protein, ur: NEGATIVE mg/dL
Specific Gravity, Urine: 1.009 (ref 1.005–1.030)
pH: 7 (ref 5.0–8.0)

## 2019-12-03 LAB — BASIC METABOLIC PANEL
Anion gap: 12 (ref 5–15)
BUN: 9 mg/dL (ref 8–23)
CO2: 23 mmol/L (ref 22–32)
Calcium: 7.9 mg/dL — ABNORMAL LOW (ref 8.9–10.3)
Chloride: 103 mmol/L (ref 98–111)
Creatinine, Ser: 0.61 mg/dL (ref 0.61–1.24)
GFR calc Af Amer: >60 mL/min (ref >60)
GFR calc non Af Amer: >60 mL/min (ref >60)
Glucose, Bld: 150 mg/dL — ABNORMAL HIGH (ref 70–99)
Potassium: 4.3 mmol/L (ref 3.5–5.1)
Sodium: 138 mmol/L (ref 135–145)

## 2019-12-03 LAB — CBC
HCT: 44 % (ref 39.0–52.0)
Hemoglobin: 15.2 g/dL (ref 13.0–17.0)
MCH: 29.3 pg (ref 26.0–34.0)
MCHC: 34.5 g/dL (ref 30.0–36.0)
MCV: 84.8 fL (ref 80.0–100.0)
Platelets: 167 10*3/uL (ref 150–400)
RBC: 5.19 MIL/uL (ref 4.22–5.81)
RDW: 12.6 % (ref 11.5–15.5)
WBC: 6.1 10*3/uL (ref 4.0–10.5)
nRBC: 0 % (ref 0.0–0.2)

## 2019-12-03 LAB — POCT I-STAT 7, (LYTES, BLD GAS, ICA,H+H)
Acid-Base Excess: 2 mmol/L (ref 0.0–2.0)
Acid-Base Excess: 2 mmol/L (ref 0.0–2.0)
Bicarbonate: 24.3 mmol/L (ref 20.0–28.0)
Bicarbonate: 24.9 mmol/L (ref 20.0–28.0)
Calcium, Ion: 1.09 mmol/L — ABNORMAL LOW (ref 1.15–1.40)
Calcium, Ion: 1.08 mmol/L — ABNORMAL LOW (ref 1.15–1.40)
HCT: 43 % (ref 39.0–52.0)
HCT: 42 % (ref 39.0–52.0)
Hemoglobin: 14.6 g/dL (ref 13.0–17.0)
Hemoglobin: 14.3 g/dL (ref 13.0–17.0)
O2 Saturation: 89 %
O2 Saturation: 91 %
Patient temperature: 97.8
Patient temperature: 97.5
Potassium: 4 mmol/L (ref 3.5–5.1)
Potassium: 4 mmol/L (ref 3.5–5.1)
Sodium: 137 mmol/L (ref 135–145)
Sodium: 140 mmol/L (ref 135–145)
TCO2: 25 mmol/L (ref 22–32)
TCO2: 26 mmol/L (ref 22–32)
pCO2 arterial: 31.8 mmHg — ABNORMAL LOW (ref 32.0–48.0)
pCO2 arterial: 31.3 mmHg — ABNORMAL LOW (ref 32.0–48.0)
pH, Arterial: 7.49 — ABNORMAL HIGH (ref 7.350–7.450)
pH, Arterial: 7.507 — ABNORMAL HIGH (ref 7.350–7.450)
pO2, Arterial: 49 mmHg — ABNORMAL LOW (ref 83.0–108.0)
pO2, Arterial: 52 mmHg — ABNORMAL LOW (ref 83.0–108.0)

## 2019-12-03 LAB — GLUCOSE, CAPILLARY
Glucose-Capillary: 157 mg/dL — ABNORMAL HIGH (ref 70–99)
Glucose-Capillary: 116 mg/dL — ABNORMAL HIGH (ref 70–99)
Glucose-Capillary: 125 mg/dL — ABNORMAL HIGH (ref 70–99)
Glucose-Capillary: 130 mg/dL — ABNORMAL HIGH (ref 70–99)
Glucose-Capillary: 156 mg/dL — ABNORMAL HIGH (ref 70–99)

## 2019-12-03 LAB — CBG MONITORING, ED: Glucose-Capillary: 150 mg/dL — ABNORMAL HIGH (ref 70–99)

## 2019-12-03 LAB — BRAIN NATRIURETIC PEPTIDE: B Natriuretic Peptide: 43.6 pg/mL (ref 0.0–100.0)

## 2019-12-03 LAB — MRSA PCR SCREENING: MRSA by PCR: NEGATIVE

## 2019-12-03 LAB — MAGNESIUM: Magnesium: 2 mg/dL (ref 1.7–2.4)

## 2019-12-03 MED ORDER — SODIUM CHLORIDE 0.9 % IV SOLN
INTRAVENOUS | Status: DC | PRN
  Administered 2019-12-03 – 2019-12-04 (×2): 500 mL via INTRAVENOUS

## 2019-12-03 MED ORDER — CHLORHEXIDINE GLUCONATE CLOTH 2 % EX PADS
6.0000 | MEDICATED_PAD | Freq: Every day | CUTANEOUS | Status: DC
  Administered 2019-12-03: 6 via TOPICAL

## 2019-12-03 MED ORDER — IOHEXOL 350 MG/ML SOLN
80.0000 mL | Freq: Once | INTRAVENOUS | Status: AC | PRN
  Administered 2019-12-03: 80 mL via INTRAVENOUS

## 2019-12-03 MED ORDER — ENOXAPARIN SODIUM 40 MG/0.4ML ~~LOC~~ SOLN: 40.0000 mg | SUBCUTANEOUS | Status: DC

## 2019-12-03 MED ORDER — FUROSEMIDE 10 MG/ML IJ SOLN
40.0000 mg | Freq: Once | INTRAMUSCULAR | Status: AC
  Administered 2019-12-03: 40 mg via INTRAVENOUS
  Filled 2019-12-03: qty 4

## 2019-12-03 MED ORDER — INSULIN ASPART 100 UNIT/ML ~~LOC~~ SOLN
0.0000 [IU] | SUBCUTANEOUS | Status: DC
  Administered 2019-12-03: 12:00:00 2 [IU] via SUBCUTANEOUS
  Administered 2019-12-03 (×2): 3 [IU] via SUBCUTANEOUS
  Administered 2019-12-03 (×2): 2 [IU] via SUBCUTANEOUS
  Administered 2019-12-04: 20:00:00 11 [IU] via SUBCUTANEOUS
  Administered 2019-12-04 (×6): 3 [IU] via SUBCUTANEOUS
  Administered 2019-12-05: 2 [IU] via SUBCUTANEOUS
  Administered 2019-12-05: 8 [IU] via SUBCUTANEOUS
  Administered 2019-12-05 (×2): 3 [IU] via SUBCUTANEOUS

## 2019-12-03 MED ORDER — ENOXAPARIN SODIUM 40 MG/0.4ML ~~LOC~~ SOLN
40.0000 mg | SUBCUTANEOUS | Status: DC
  Administered 2019-12-03 – 2019-12-04 (×2): 40 mg via SUBCUTANEOUS
  Filled 2019-12-03 (×2): qty 0.4

## 2019-12-03 NOTE — Progress Notes (Signed)
Initial Nutrition Assessment  RD working remotely.  DOCUMENTATION CODES:   Not applicable  INTERVENTION:   - Once diet advanced, recommend Regular diet order  - Once diet advanced, Ensure Enlive po TID, each supplement provides 350 kcal and 20 grams of protein  - Encourage adequate PO intake  NUTRITION DIAGNOSIS:   Inadequate oral intake related to inability to eat as evidenced by NPO status.  GOAL:   Patient will meet greater than or equal to 90% of their needs  MONITOR:   Diet advancement, Labs, Weight trends  REASON FOR ASSESSMENT:   Malnutrition Screening Tool    ASSESSMENT:   63 year old male who presented to the ED on 12/17 with SOB and abdominal pain. No known PMH. Pt tested positive for COVID-19.   Pt is NPO at this time. RD will monitor for diet advancement and supplement as appropriate.  Unable to obtain diet and weight history at this time. No weight history available in chart.  Per notes, pt was reporting decreased PO intake PTA due to lack of appetite.  Medications reviewed and include: decadron, lasix once, SSI, remdesivir IVF: NS @ 10 ml/hr  Labs reviewed: hemoglobin A1C 7.5 CBG's: 116-198 x 24 hours  NUTRITION - FOCUSED PHYSICAL EXAM:  Unable to complete at this time. RD working remotely.  Diet Order:   Diet Order            Diet NPO time specified Except for: Sips with Meds  Diet effective now              EDUCATION NEEDS:   No education needs have been identified at this time  Skin:  Skin Assessment: Reviewed RN Assessment  Last BM:  no documented BM  Height:   Ht Readings from Last 1 Encounters:  No data found for Ht    Weight:   Wt Readings from Last 1 Encounters:  12/03/19 79.1 kg    Ideal Body Weight:  (unable to calculate due to no height in chart)  BMI:  There is no height or weight on file to calculate BMI.  Estimated Nutritional Needs:   Kcal:  2200-2400  Protein:  100-120 grams  Fluid:  >/= 2.0  L    Gaynell Face, MS, RD, LDN Inpatient Clinical Dietitian Pager: [phone] Weekend/After Hours: [phone]

## 2019-12-03 NOTE — Progress Notes (Addendum)
Brief Progress Note  S: Awake and alert, following commands  RR < 30 States his breathing is a little better  O: Remains on HFNC at 35 L Min/ NRB, sats are 87% RR 22-25 Afebrile PCT 0.17 Ferritin 823 Lactate 2.0 on 12/17 + 650 Mag 2.0 Able to speak in full sentences, but some dyspnea CTA as noted below ABG 7.50, 31.3, 52,, 26,24.9 at 10 am CTA 12-15-19 Progressive bilateral lung opacities from radiograph yesterday. Multifocal ground-glass opacities with superimposed consolidations in the dependent lower lobes with air bronchograms. Pattern consistent with COVID-19 pneumonia, superimposed bacterial infection in the lower lobes is also considered. Coronary artery calcifications  Examination: General: alert/oriented x 3, follows commands HENT: NCAT, dry MM Lungs: bilateral chest excursion,  Slight increase in WOB , diminished per bases with rhonchi throughout Cardiovascular: RRR, no murmur, no JVD  Abdomen: soft, nontender, normal bowel sounds Extremities: warm, well-perfused without cyanosis, edema, no obvious abnormalities Neuro: grossly nonfocal, follows commands, appropriate  A: Acute Respiratory Failure 2/2 COVID 19 Hemodynamically stable  At present Respiratory alkalosis  Needs  careful monitoring for decompensation   P: Titrate oxygen to maintain sats > 88% Encourage rotating and self proning Aggressive pulmonary toilet IS Q 1 while awake OOB to chair Trend Sats  Trend ABG's prn Trend CXR Continue Decadron and Remdesivir Continue Lovenox Echo per TS LE Dopplers per TS Trend Lactate Maintain net negative fluid balance as able  Lasix 40 x 1 Monitor renal function and electrolytes, replete as needed Leave in ICU for now with TS as primary to monitor for decompensation Re-consult PCCM for decompensation   Magdalen Spatz, MSN, AGACNP-BC [place] Pager # [phone] After 4 pm please call [phone] 12/03/2019 10:44  AM  Attending Note:  63 year old male with COVID 19 hypoxemic respiratory failure.  Transferred to the ICU for increased O2 demand.  O2 demand remains high but patient is very comfortable on exam.  Happy hypoxemic.  I reviewed CXR myself, infiltrate noted.  Discussed with PCCM-NP.  Maintain dry as able.  Continue remdesivir and steroids with lovenox.  Will need additional time to recover.  Will transfer out of the ICU to 5W and back to IMTS with PCCM available as PRN.    The patient is critically ill with multiple organ systems failure and requires high complexity decision making for assessment and support, frequent evaluation and titration of therapies, application of advanced monitoring technologies and extensive interpretation of multiple databases.   Critical Care Time devoted to patient care services described in this note is  45  Minutes. This time reflects time of care of this signee Dr Jennet Maduro. This critical care time does not reflect procedure time, or teaching time or supervisory time of PA/NP/Med student/Med Resident etc but could involve care discussion time.  Rush Farmer, M.D. [facility] Pulmonary/Critical Care Medicine.

## 2019-12-03 NOTE — H&P (Signed)
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 His intake has been limited because he's felt so crummy. Has also had muscle aches, joint pain. No history or family history of VTE, CP, orthopnea, LE swelling, hemoptysis, n/v/d, HA, one-sided weakness.   Past Medical History  None  Significant Hospital Events   12/18 admitted  Consults:  None  Procedures:  None  Significant Diagnostic Tests:  CXR with elevated R hemidiaphragm covid-19 + Ag  Micro Data:  12/17 BCx  Antimicrobials:  none  Interim history/subjective:  n/a  Objective   Blood pressure 112/71, pulse 74, temperature 97.8 F (36.6 C), temperature source Oral, resp. rate 20, SpO2 (!) 84 %.        Intake/Output Summary (Last 24 hours) at 12/03/2019 0119 Last data filed at 11/29/2019 1648 Gross per 24 hour  Intake 1250 ml  Output --  Net 1250 ml   There were no vitals filed for this visit.  Examination: General: alert/oriented x3 HENT: NCAT, dry MM Lungs: auscultation deferred, normal work of breathing, sentence dyspnea Cardiovascular: RRR, no murmur, no JVD  Abdomen: soft, nontender, normal bowel sounds Extremities: warm, well-perfused without cyanosis, edema Neuro: grossly nonfocal, follows commands  Resolved Hospital Problem list   n/a  Assessment & Plan:   # Acute hypoxic respiratory failure due to covid-19: - HHFNC goal 85-92% if continues to breathe comfortably with RR <30 - encouraged rotating/self-proning - CTA Chest as hypoxia does seem a bit out of proportion to parenchymal abnormalities. 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 Says this individual may be able to get in touch with his children in [place]. Disposition: MICU  Labs   CBC: Recent Labs  Lab 12/01/2019 1023  WBC 6.8  NEUTROABS 5.4  HGB 17.5*  HCT 51.2  MCV 85.3  PLT 167    Basic Metabolic Panel: Recent Labs  Lab 12/11/2019 1023  NA 137  K 3.1*  CL 98  CO2 26  GLUCOSE 227*  BUN <5*  CREATININE 0.76  CALCIUM 8.0*   GFR: CrCl cannot be calculated (Unknown ideal weight.). Recent Labs  Lab 11/17/2019 1023 12/06/2019 1210 11/28/2019 1300  PROCALCITON  --  0.17  --   WBC 6.8  --   --   LATICACIDVEN 3.2*  --  2.0*    Liver Function Tests: Recent Labs  Lab 12/06/2019 1023  AST 96*  ALT 97*  ALKPHOS 60  BILITOT 1.0  PROT 7.1  ALBUMIN 3.1*   No results for input(s): LIPASE, AMYLASE in the last 168 hours. No results for input(s): AMMONIA in the last 168 hours.  ABG    Component Value Date/Time   PHART 7.362 12/07/2007 0955   PCO2ART 20.8 (L) 12/07/2007 0955   PO2ART 76.0 (L) 12/07/2007 0955   HCO3 11.8 (L) 12/07/2007 0955   TCO2 12 12/07/2007 0955   ACIDBASEDEF 12.0 (H) 12/07/2007 0955   O2SAT 95.0 12/07/2007 0955     Coagulation Profile: No results for input(s): INR, PROTIME in the last 168 hours.  Cardiac Enzymes: No results for input(s): CKTOTAL, CKMB, CKMBINDEX, TROPONINI in the last 168 hours.  HbA1C: Hgb A1c MFr Bld  Date/Time Value Ref Range Status  12/11/19  07:13 PM 7.5 (H) 4.8 - 5.6 % Final    Comment:    (NOTE) Pre diabetes:          5.7%-6.4% Diabetes:              >6.4% Glycemic control for   <7.0% adults with diabetes     CBG: Recent Labs  Lab 12-11-19 1648 2019-12-11 2208  GLUCAP 158* 198*    Review of Systems:   A twelve point review of systems is negative except as otherwise noted in HPI  Past Medical History  He,  has no past medical history on file.   Surgical History   History reviewed. No pertinent surgical history.   Social History   reports previous alcohol use. He reports that he does not use drugs.   Family History   His family history is not on file.   Allergies Not on File   Home Medications  Prior to Admission medications   Not on File     Critical care time: 30    This patient is critically ill with acute hypoxemic respiratory failure due to covid-19 which, requires frequent high complexity decision making, assessment, support, evaluation, and titration of therapies. This was completed through the application of advanced monitoring technologies and extensive interpretation of multiple databases. During this encounter critical care time was devoted to patient care services described in this note for 30 minutes.  [name] PGY-5, Pulmonary/Critical Care

## 2019-12-04 ENCOUNTER — Inpatient Hospital Stay (HOSPITAL_COMMUNITY): Payer: HRSA Program

## 2019-12-04 ENCOUNTER — Encounter (HOSPITAL_COMMUNITY): Payer: Self-pay | Admitting: Student

## 2019-12-04 LAB — COMPREHENSIVE METABOLIC PANEL
ALT: 100 U/L — ABNORMAL HIGH (ref 0–44)
AST: 91 U/L — ABNORMAL HIGH (ref 15–41)
Albumin: 2.6 g/dL — ABNORMAL LOW (ref 3.5–5.0)
Alkaline Phosphatase: 55 U/L (ref 38–126)
Anion gap: 12 (ref 5–15)
BUN: 21 mg/dL (ref 8–23)
CO2: 25 mmol/L (ref 22–32)
Calcium: 7.9 mg/dL — ABNORMAL LOW (ref 8.9–10.3)
Chloride: 106 mmol/L (ref 98–111)
Creatinine, Ser: 0.8 mg/dL (ref 0.61–1.24)
GFR calc Af Amer: >60 mL/min (ref >60)
GFR calc non Af Amer: >60 mL/min (ref >60)
Glucose, Bld: 195 mg/dL — ABNORMAL HIGH (ref 70–99)
Potassium: 3.8 mmol/L (ref 3.5–5.1)
Sodium: 143 mmol/L (ref 135–145)
Total Bilirubin: 1.1 mg/dL (ref 0.3–1.2)
Total Protein: 6.3 g/dL — ABNORMAL LOW (ref 6.5–8.1)

## 2019-12-04 LAB — CBC
HCT: 46.4 % (ref 39.0–52.0)
Hemoglobin: 15.6 g/dL (ref 13.0–17.0)
MCH: 29.3 pg (ref 26.0–34.0)
MCHC: 33.6 g/dL (ref 30.0–36.0)
MCV: 87.1 fL (ref 80.0–100.0)
Platelets: 218 10*3/uL (ref 150–400)
RBC: 5.33 MIL/uL (ref 4.22–5.81)
RDW: 13 % (ref 11.5–15.5)
WBC: 8.9 10*3/uL (ref 4.0–10.5)
nRBC: 0 % (ref 0.0–0.2)

## 2019-12-04 LAB — GLUCOSE, CAPILLARY
Glucose-Capillary: 167 mg/dL — ABNORMAL HIGH (ref 70–99)
Glucose-Capillary: 176 mg/dL — ABNORMAL HIGH (ref 70–99)
Glucose-Capillary: 177 mg/dL — ABNORMAL HIGH (ref 70–99)
Glucose-Capillary: 179 mg/dL — ABNORMAL HIGH (ref 70–99)
Glucose-Capillary: 181 mg/dL — ABNORMAL HIGH (ref 70–99)
Glucose-Capillary: 185 mg/dL — ABNORMAL HIGH (ref 70–99)
Glucose-Capillary: 302 mg/dL — ABNORMAL HIGH (ref 70–99)

## 2019-12-04 LAB — PROCALCITONIN: Procalcitonin: <0.1 ng/mL

## 2019-12-04 LAB — MAGNESIUM: Magnesium: 2.5 mg/dL — ABNORMAL HIGH (ref 1.7–2.4)

## 2019-12-04 MED ORDER — CHLORHEXIDINE GLUCONATE CLOTH 2 % EX PADS
6.0000 | MEDICATED_PAD | Freq: Every day | CUTANEOUS | Status: DC
  Administered 2019-12-05 – 2020-01-03 (×23): 6 via TOPICAL

## 2019-12-04 MED ORDER — ENOXAPARIN SODIUM 40 MG/0.4ML ~~LOC~~ SOLN
40.0000 mg | Freq: Two times a day (BID) | SUBCUTANEOUS | Status: DC
  Administered 2019-12-04 – 2019-12-22 (×36): 40 mg via SUBCUTANEOUS
  Filled 2019-12-04 (×36): qty 0.4

## 2019-12-04 MED ORDER — BOOST / RESOURCE BREEZE PO LIQD CUSTOM: 1.0000 | Freq: Three times a day (TID) | ORAL | Status: DC

## 2019-12-04 MED ORDER — ORAL CARE MOUTH RINSE
15.0000 mL | Freq: Two times a day (BID) | OROMUCOSAL | Status: DC
  Administered 2019-12-04 – 2019-12-16 (×22): 15 mL via OROMUCOSAL

## 2019-12-04 NOTE — Progress Notes (Signed)
Pt RR upper 40's, SpO2 low 80's. Unable to increase HHFNC, putting HOB at 90 degrees made no improvement. Asked pt to prone himself, RR improved to low 30s, SpO2 mid 90's. Able to remove NRB and reduce HHFNC flow rate. Pt now resting comfortably proned.

## 2019-12-04 NOTE — Treatment Plan (Signed)
Called by Dr. Elsworth Soho regarding transfer to [facility]. Henry [name] 63 yo with diabetes, but otherwise no significant noted PMH who presented 12/1 with acute hypoxic respiratory failure 2/2 COVID 19 pneumonia.  Currently requiring 40 L HHFNC.  Dr. Elsworth Soho planning to make sure he's able to tolerate NRB for transport and will transfer to [facility] ICU if this is the case.  Discussed with [facility] ICU charge who noted we do currently have beds.  Accepted to [facility] pending ability to transfer on NRB.

## 2019-12-04 NOTE — Progress Notes (Signed)
   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 rate (!) 26, weight 78.5 kg, SpO2 91 %.    FiO2 (%):  [100 %] 100 %   Intake/Output Summary (Last 24 hours) at 12/04/2019 1345 Last data filed at 12/04/2019 1200 Gross per 24 hour  Intake 16.27 ml  Output 1480 ml  Net -1463.73 ml   Filed Weights   12/03/19 0138 12/03/19 0255 12/04/19 0500  Weight: 78.6 kg 79.1 kg 78.5 kg    Examination: General: alert/oriented x3 HENT: NCAT, dry MM Lungs: No accessory muscle use, right basal crackles, no rhonchi Cardiovascular: RRR, no murmur, no JVD  Abdomen: soft, nontender, normal bowel sounds Extremities: warm, well-perfused without cyanosis, edema Neuro: grossly nonfocal, alert, interactive  Chest x-ray 12/19 personally reviewed, elevated right hemidiaphragm, patchy bilateral infiltrates increased  Resolved Hospital Problem list   n/a  Assessment & Plan:   # Acute hypoxic respiratory failure due to covid-19: - HHFNC goal 85-92% if continues to breathe comfortably with RR <30 - encouraged rotating/self-proning - remdesivir/dex  Elevated right hemidiaphragm-chronic finding  dates back to 2008 -Add incentive spirometer  # Diabetes:?  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 Says this individual may be able to get in touch with his children in [place]. Disposition: ICU , can transfer to [facility] if beds available   The patient is critically ill with multiple organ systems failure and requires high complexity decision making for assessment and support, frequent evaluation and titration of therapies, application of advanced monitoring technologies and extensive interpretation of multiple databases. Critical Care Time devoted to patient care services described in this note independent of APP/resident  time is 31 minutes.   Kara Mead MD. Shade Flood. [place] Pulmonary & Critical care  If no response to pager , please call [phone]   12/04/2019

## 2019-12-05 ENCOUNTER — Encounter (HOSPITAL_COMMUNITY): Payer: Self-pay | Admitting: Student

## 2019-12-05 LAB — CBC WITH DIFFERENTIAL/PLATELET
Abs Immature Granulocytes: 0.03 10*3/uL (ref 0.00–0.07)
Basophils Absolute: 0 10*3/uL (ref 0.0–0.1)
Basophils Relative: 0 %
Eosinophils Absolute: 0 10*3/uL (ref 0.0–0.5)
Eosinophils Relative: 0 %
HCT: 46.5 % (ref 39.0–52.0)
Hemoglobin: 15.4 g/dL (ref 13.0–17.0)
Immature Granulocytes: 0 %
Lymphocytes Relative: 7 %
Lymphs Abs: 0.5 10*3/uL — ABNORMAL LOW (ref 0.7–4.0)
MCH: 28.7 pg (ref 26.0–34.0)
MCHC: 33.1 g/dL (ref 30.0–36.0)
MCV: 86.8 fL (ref 80.0–100.0)
Monocytes Absolute: 0.2 10*3/uL (ref 0.1–1.0)
Monocytes Relative: 3 %
Neutro Abs: 6.3 10*3/uL (ref 1.7–7.7)
Neutrophils Relative %: 90 %
Platelets: 268 10*3/uL (ref 150–400)
RBC: 5.36 MIL/uL (ref 4.22–5.81)
RDW: 12.9 % (ref 11.5–15.5)
WBC: 7 10*3/uL (ref 4.0–10.5)
nRBC: 0 % (ref 0.0–0.2)

## 2019-12-05 LAB — COMPREHENSIVE METABOLIC PANEL
ALT: 119 U/L — ABNORMAL HIGH (ref 0–44)
AST: 92 U/L — ABNORMAL HIGH (ref 15–41)
Albumin: 3 g/dL — ABNORMAL LOW (ref 3.5–5.0)
Alkaline Phosphatase: 60 U/L (ref 38–126)
Anion gap: 11 (ref 5–15)
BUN: 22 mg/dL (ref 8–23)
CO2: 21 mmol/L — ABNORMAL LOW (ref 22–32)
Calcium: 7.7 mg/dL — ABNORMAL LOW (ref 8.9–10.3)
Chloride: 110 mmol/L (ref 98–111)
Creatinine, Ser: 0.62 mg/dL (ref 0.61–1.24)
GFR calc Af Amer: >60 mL/min (ref >60)
GFR calc non Af Amer: >60 mL/min (ref >60)
Glucose, Bld: 197 mg/dL — ABNORMAL HIGH (ref 70–99)
Potassium: 4.2 mmol/L (ref 3.5–5.1)
Sodium: 142 mmol/L (ref 135–145)
Total Bilirubin: 1.6 mg/dL — ABNORMAL HIGH (ref 0.3–1.2)
Total Protein: 6.4 g/dL — ABNORMAL LOW (ref 6.5–8.1)

## 2019-12-05 LAB — HEPATIC FUNCTION PANEL
ALT: 89 U/L — ABNORMAL HIGH (ref 0–44)
AST: 70 U/L — ABNORMAL HIGH (ref 15–41)
Albumin: 2.5 g/dL — ABNORMAL LOW (ref 3.5–5.0)
Alkaline Phosphatase: 61 U/L (ref 38–126)
Bilirubin, Direct: 0.3 mg/dL — ABNORMAL HIGH (ref 0.0–0.2)
Indirect Bilirubin: 0.8 mg/dL (ref 0.3–0.9)
Total Bilirubin: 1.1 mg/dL (ref 0.3–1.2)
Total Protein: 6 g/dL — ABNORMAL LOW (ref 6.5–8.1)

## 2019-12-05 LAB — GLUCOSE, CAPILLARY
Glucose-Capillary: 145 mg/dL — ABNORMAL HIGH (ref 70–99)
Glucose-Capillary: 192 mg/dL — ABNORMAL HIGH (ref 70–99)
Glucose-Capillary: 259 mg/dL — ABNORMAL HIGH (ref 70–99)
Glucose-Capillary: 193 mg/dL — ABNORMAL HIGH (ref 70–99)
Glucose-Capillary: 183 mg/dL — ABNORMAL HIGH (ref 70–99)

## 2019-12-05 LAB — PROCALCITONIN
Procalcitonin: 0.47 ng/mL
Procalcitonin: <0.1 ng/mL

## 2019-12-05 LAB — D-DIMER, QUANTITATIVE: D-Dimer, Quant: 1.22 ug/mL-FEU — ABNORMAL HIGH (ref 0.00–0.50)

## 2019-12-05 LAB — ABO/RH: ABO/RH(D): A POS

## 2019-12-05 LAB — FERRITIN: Ferritin: 587 ng/mL — ABNORMAL HIGH (ref 24–336)

## 2019-12-05 LAB — PHOSPHORUS: Phosphorus: 3.6 mg/dL (ref 2.5–4.6)

## 2019-12-05 LAB — C-REACTIVE PROTEIN: CRP: 4.7 mg/dL — ABNORMAL HIGH (ref <1.0)

## 2019-12-05 MED ORDER — INSULIN ASPART 100 UNIT/ML ~~LOC~~ SOLN
0.0000 [IU] | Freq: Three times a day (TID) | SUBCUTANEOUS | Status: DC
  Administered 2019-12-06: 11 [IU] via SUBCUTANEOUS
  Administered 2019-12-06: 3 [IU] via SUBCUTANEOUS

## 2019-12-05 MED ORDER — SODIUM CHLORIDE 0.9% IV SOLUTION: Freq: Once | INTRAVENOUS | Status: DC

## 2019-12-05 MED ORDER — METHYLPREDNISOLONE SODIUM SUCC 40 MG IJ SOLR
40.0000 mg | Freq: Two times a day (BID) | INTRAMUSCULAR | Status: AC
  Administered 2019-12-05 – 2019-12-09 (×9): 40 mg via INTRAVENOUS
  Filled 2019-12-05 (×9): qty 1

## 2019-12-05 NOTE — Progress Notes (Signed)
   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 rate 19, height 5\' 5"  (1.651 m), weight 78.4 kg, SpO2 90 %.    FiO2 (%):  [100 %] 100 %   Intake/Output Summary (Last 24 hours) at 12/05/2019 1108 Last data filed at 12/05/2019 0900 Gross per 24 hour  Intake 720.96 ml  Output 755 ml  Net -34.04 ml   Filed Weights   12/04/19 0500 12/04/19 0800 12/05/19 0445  Weight: 78.5 kg 78.5 kg 78.4 kg    Examination: General: Middle-age man, sitting up in bed, no distress HENT: NCAT, dry MM Lungs: No accessory muscle use, right basal crackles, no rhonchi Cardiovascular: RRR, no murmur, no JVD  Abdomen: soft, nontender, normal bowel sounds Extremities: warm, well-perfused without cyanosis, edema Neuro: grossly nonfocal, alert, interactive  Chest x-ray 12/19 personally reviewed, elevated right hemidiaphragm, patchy bilateral infiltrates increased  Labs show low procalcitonin, LFTs slight high but stable  Resolved Hospital Problem list   n/a  Assessment & Plan:   # Acute hypoxic respiratory failure due to covid-19: - HHFNC goal 85-92% if continues to breathe comfortably with RR  <30 - encouraged rotating/self-proning - ct remdesivir/dex  -Monitor LFTs  Elevated right hemidiaphragm-chronic finding dates back to 2008 -Add incentive spirometer  # Diabetes:?  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 Says this individual may be able to get in touch with his children in [place]. Disposition: ICU , can transfer to [facility] if beds available    The patient is critically ill with multiple organ systems failure and requires high complexity decision making for assessment and support, frequent evaluation and titration of therapies, application of advanced monitoring technologies and extensive interpretation of multiple databases. Critical Care Time devoted to patient care services described in this note independent of APP/resident  time is 31 minutes.     Kara Mead MD. Shade Flood. [place] Pulmonary & Critical care  If no response to pager , please call [phone]   12/05/2019

## 2019-12-05 NOTE — Progress Notes (Signed)
Per [facility] patient can be transported on NRB and [place] up to 6L, Patient was able to maintain sats of at least 83% for at least 30 minutes, per Dr. Elsworth Soho patient okay to transfer with SAT goal of 83%.

## 2019-12-05 NOTE — Progress Notes (Signed)
Seen on arrival after transfer from [facility] ICU. Pt appears comfortable, but has significant O2 requiremnt, 30 L HFNC satting in mid 80's.  He notes subjective improvement in symptoms since his arrival several days ago. Denies any sig medical hx or home meds.  Vitals:   12/05/19 1745 12/05/19 1826  BP: 105/72 104/66  Pulse: 63 (!) 59  Resp:  20  Temp:  98.5 F (36.9 C)  SpO2: 92% (!) 87%   NAD, RRR, unlabored, CTAB on 30 L HFNC, s/nt/nd, no sig lee  Continue remdesivir.  Increase steroid dose. We discussed plasma risk/benefits.  He's going to read the consent form and let us know if additional questions.  If he signs, will plan to give plasma.  Discussed actemra, risks/benefits.  Discussed off label use and lack of clear evidence.  Denies hx of TB or hepatitis.  He's agreeable to this if needed.  Awaiting labs first.

## 2019-12-05 NOTE — Progress Notes (Signed)
   12/05/19 1826  Vitals  Temp 98.5 F (36.9 C)  Temp Source Oral  BP 104/66  MAP (mmHg) 76  BP Location Left Arm  BP Method Automatic  Patient Position (if appropriate) Lying  Pulse Rate (!) 59  Pulse Rate Source Monitor  ECG Heart Rate 62  Cardiac Rhythm NSR  Resp 20  Level of Consciousness  Level of Consciousness Alert  Oxygen Therapy  SpO2 (!) 87 %  O2 Device HFNC  O2 Flow Rate (L/min) 30 L/min  FiO2 (%) 100 %  Pulse Oximetry Type Continuous  Pain Assessment  Pain Scale 0-10  Pain Score 0  Glasgow Coma Scale  Eye Opening 4  Best Verbal Response (NON-intubated) 5  Best Motor Response 6  Glasgow Coma Scale Score 15  MEWS Score  MEWS RR 0  MEWS Pulse 0  MEWS Systolic 0  MEWS LOC 0  MEWS Temp 0  MEWS Score 0  MEWS Score Color Green   Pt arrived at [facility] room 166 at this time. VS above. Pt stable on HHFNC.

## 2019-12-06 LAB — CBC WITH DIFFERENTIAL/PLATELET
Abs Immature Granulocytes: 0.04 10*3/uL (ref 0.00–0.07)
Basophils Absolute: 0 10*3/uL (ref 0.0–0.1)
Basophils Relative: 0 %
Eosinophils Absolute: 0 10*3/uL (ref 0.0–0.5)
Eosinophils Relative: 0 %
HCT: 47.5 % (ref 39.0–52.0)
Hemoglobin: 15.5 g/dL (ref 13.0–17.0)
Immature Granulocytes: 1 %
Lymphocytes Relative: 7 %
Lymphs Abs: 0.5 10*3/uL — ABNORMAL LOW (ref 0.7–4.0)
MCH: 29 pg (ref 26.0–34.0)
MCHC: 32.6 g/dL (ref 30.0–36.0)
MCV: 89 fL (ref 80.0–100.0)
Monocytes Absolute: 0.2 10*3/uL (ref 0.1–1.0)
Monocytes Relative: 3 %
Neutro Abs: 6.4 10*3/uL (ref 1.7–7.7)
Neutrophils Relative %: 89 %
Platelets: 243 10*3/uL (ref 150–400)
RBC: 5.34 MIL/uL (ref 4.22–5.81)
RDW: 13 % (ref 11.5–15.5)
WBC: 7.2 10*3/uL (ref 4.0–10.5)
nRBC: 0 % (ref 0.0–0.2)

## 2019-12-06 LAB — COMPREHENSIVE METABOLIC PANEL
ALT: 112 U/L — ABNORMAL HIGH (ref 0–44)
AST: 73 U/L — ABNORMAL HIGH (ref 15–41)
Albumin: 2.7 g/dL — ABNORMAL LOW (ref 3.5–5.0)
Alkaline Phosphatase: 62 U/L (ref 38–126)
Anion gap: 11 (ref 5–15)
BUN: 22 mg/dL (ref 8–23)
CO2: 23 mmol/L (ref 22–32)
Calcium: 7.7 mg/dL — ABNORMAL LOW (ref 8.9–10.3)
Chloride: 105 mmol/L (ref 98–111)
Creatinine, Ser: 0.69 mg/dL (ref 0.61–1.24)
GFR calc Af Amer: >60 mL/min (ref >60)
GFR calc non Af Amer: >60 mL/min (ref >60)
Glucose, Bld: 228 mg/dL — ABNORMAL HIGH (ref 70–99)
Potassium: 4.2 mmol/L (ref 3.5–5.1)
Sodium: 139 mmol/L (ref 135–145)
Total Bilirubin: 1.3 mg/dL — ABNORMAL HIGH (ref 0.3–1.2)
Total Protein: 6.2 g/dL — ABNORMAL LOW (ref 6.5–8.1)

## 2019-12-06 LAB — D-DIMER, QUANTITATIVE: D-Dimer, Quant: 1.12 ug/mL-FEU — ABNORMAL HIGH (ref 0.00–0.50)

## 2019-12-06 LAB — GLUCOSE, CAPILLARY
Glucose-Capillary: 198 mg/dL — ABNORMAL HIGH (ref 70–99)
Glucose-Capillary: 313 mg/dL — ABNORMAL HIGH (ref 70–99)
Glucose-Capillary: 386 mg/dL — ABNORMAL HIGH (ref 70–99)

## 2019-12-06 LAB — C-REACTIVE PROTEIN: CRP: 4 mg/dL — ABNORMAL HIGH (ref <1.0)

## 2019-12-06 LAB — PROCALCITONIN: Procalcitonin: <0.1 ng/mL

## 2019-12-06 LAB — MAGNESIUM: Magnesium: 2.3 mg/dL (ref 1.7–2.4)

## 2019-12-06 LAB — FERRITIN: Ferritin: 585 ng/mL — ABNORMAL HIGH (ref 24–336)

## 2019-12-06 LAB — BRAIN NATRIURETIC PEPTIDE: B Natriuretic Peptide: 55.1 pg/mL (ref 0.0–100.0)

## 2019-12-06 MED ORDER — INSULIN ASPART 100 UNIT/ML ~~LOC~~ SOLN
5.0000 [IU] | Freq: Three times a day (TID) | SUBCUTANEOUS | Status: DC
  Administered 2019-12-06 – 2019-12-08 (×6): 5 [IU] via SUBCUTANEOUS

## 2019-12-06 MED ORDER — INSULIN ASPART 100 UNIT/ML ~~LOC~~ SOLN
0.0000 [IU] | Freq: Three times a day (TID) | SUBCUTANEOUS | Status: DC
  Administered 2019-12-06: 20 [IU] via SUBCUTANEOUS
  Administered 2019-12-07: 09:00:00 11 [IU] via SUBCUTANEOUS
  Administered 2019-12-07 (×2): 4 [IU] via SUBCUTANEOUS
  Administered 2019-12-08: 11 [IU] via SUBCUTANEOUS
  Administered 2019-12-08: 4 [IU] via SUBCUTANEOUS
  Administered 2019-12-08: 7 [IU] via SUBCUTANEOUS
  Administered 2019-12-09 (×2): 11 [IU] via SUBCUTANEOUS
  Administered 2019-12-09 – 2019-12-10 (×2): 15 [IU] via SUBCUTANEOUS
  Administered 2019-12-10: 4 [IU] via SUBCUTANEOUS
  Administered 2019-12-10 – 2019-12-11 (×2): 7 [IU] via SUBCUTANEOUS
  Administered 2019-12-11: 4 [IU] via SUBCUTANEOUS
  Administered 2019-12-11: 15 [IU] via SUBCUTANEOUS
  Administered 2019-12-12: 10:00:00 11 [IU] via SUBCUTANEOUS
  Administered 2019-12-12: 3 [IU] via SUBCUTANEOUS
  Administered 2019-12-14: 4 [IU] via SUBCUTANEOUS
  Administered 2019-12-14: 3 [IU] via SUBCUTANEOUS
  Administered 2019-12-15 – 2019-12-16 (×4): 4 [IU] via SUBCUTANEOUS

## 2019-12-06 MED ORDER — ENSURE ENLIVE PO LIQD
237.0000 mL | Freq: Three times a day (TID) | ORAL | Status: DC
  Administered 2019-12-06 – 2019-12-16 (×22): 237 mL via ORAL

## 2019-12-06 MED ORDER — LINAGLIPTIN 5 MG PO TABS
5.0000 mg | ORAL_TABLET | Freq: Every day | ORAL | Status: DC
  Administered 2019-12-06 – 2019-12-16 (×10): 5 mg via ORAL
  Filled 2019-12-06 (×10): qty 1

## 2019-12-06 MED ORDER — SODIUM CHLORIDE 0.9% IV SOLUTION: Freq: Once | INTRAVENOUS | Status: AC

## 2019-12-06 MED ORDER — INSULIN ASPART 100 UNIT/ML ~~LOC~~ SOLN
0.0000 [IU] | Freq: Every day | SUBCUTANEOUS | Status: DC
  Administered 2019-12-06: 5 [IU] via SUBCUTANEOUS
  Administered 2019-12-08: 2 [IU] via SUBCUTANEOUS
  Administered 2019-12-09: 3 [IU] via SUBCUTANEOUS
  Administered 2019-12-10: 5 [IU] via SUBCUTANEOUS
  Administered 2019-12-11: 3 [IU] via SUBCUTANEOUS
  Administered 2019-12-15: 2 [IU] via SUBCUTANEOUS

## 2019-12-06 MED ORDER — TOCILIZUMAB 400 MG/20ML IV SOLN
600.0000 mg | Freq: Once | INTRAVENOUS | Status: AC
  Administered 2019-12-06: 600 mg via INTRAVENOUS
  Filled 2019-12-06: qty 20

## 2019-12-06 MED ORDER — INSULIN DETEMIR 100 UNIT/ML ~~LOC~~ SOLN
0.0750 [IU]/kg | Freq: Two times a day (BID) | SUBCUTANEOUS | Status: DC
  Administered 2019-12-06 (×2): 6 [IU] via SUBCUTANEOUS
  Filled 2019-12-06 (×3): qty 0.06

## 2019-12-06 MED ORDER — INFLUENZA VAC SPLIT QUAD 0.5 ML IM SUSY
0.5000 mL | PREFILLED_SYRINGE | INTRAMUSCULAR | Status: DC
  Filled 2019-12-06 (×2): qty 0.5

## 2019-12-06 NOTE — Progress Notes (Signed)
Inpatient Diabetes Program Recommendations  AACE/ADA: New Consensus Statement on Inpatient Glycemic Control (2015)  Target Ranges:  Prepandial:   less than 140 mg/dL      Peak postprandial:   less than 180 mg/dL (1-2 hours)      Critically ill patients:  140 - 180 mg/dL   Lab Results  Component Value Date   GLUCAP 183 (H) 12/05/2019   HGBA1C 7.5 (H) 14-Dec-2019    Review of Glycemic Control Results for EFREM, PITSTICK (MRN [id number]) as of 12/06/2019 13:14  Ref. Range 12/05/2019 04:42 12/05/2019 08:09 12/05/2019 11:19 12/05/2019 15:38 12/05/2019 19:56  Glucose-Capillary Latest Ref Range: 70 - 99 mg/dL 145 (H) 192 (H) 259 (H) 193 (H) 183 (H)   Diabetes history: None noted however A1C 7.5% Outpatient Diabetes medications: None Current orders for Inpatient glycemic control:  Levemir 6 units bid Novolog moderate tid with meals  Inpatient Diabetes Program Recommendations:    Agree with orders. A1C=7.5%-Is this new diagnosis of DM?   Thanks  Adah Perl, RN, BC-ADM Inpatient Diabetes Coordinator Pager [phone] (8a-5p)

## 2019-12-06 NOTE — Progress Notes (Signed)
PROGRESS NOTE    Henry [name]  CZY:[id number] DOB: May 15, 1956 DOA: 12/14/2019 PCP: Patient, No Pcp Per   Brief Narrative:  63 yo with T2DM presenting with AHRF 2/2 COVID 19 pneumonia.   12/17 Admitted to IMTS.  Started on steroids, remdesivir.   12/18 Placed on HHFNC overnight and transferred to ICU 12/20 Transferred to PCU at [facility] on heated high flow 12/21 Requiring 40 L HHFNC.  Received plasma and actemra.  Assessment & Plan:   Active Problems:   Acute respiratory failure with hypoxia (HCC)   Acute respiratory failure due to COVID-19 [facility])   Hyperglycemia   Acute hypoxemic respiratory failure (HCC)  Acute Hypoxic Respiratory Failure 2/2 COVID 19 Pneumonia:  CXR 12/19 with bilateral pneumonia with progression from previous images CTA 12/18 without evidence of PE, but notable for multifocal ground glass opacities Remdesivir 12/17 - 12/21 Steroids 12/17 - present.  Plan for 10 days. Plasma x1 12/21. Actemra x1 12/21. Continues to have severe hypoxia requiring 45 L HHFNC I/O, daily weights Prone as able, IS, OOB Daily inflammatory labs  COVID-19 Labs  Recent Labs    12/05/19 2013 12/06/19 0430  DDIMER 1.22* 1.12*  FERRITIN 587* 585*  CRP 4.7* 4.0*    No results found for: SARSCOV2NAA  Type 2 Diabetes: new diagnosis Levemir.  SSI.  Tradjenta.  Follow for adjustment A1c 7.5  Elevated Liver Enzymes: likely 2/2 COVID infection, follow  DVT prophylaxis: intermediate dose lovenox Code Status: full  Family Communication: none at bedside Disposition Plan: pending further improvement  Consultants:   none  Procedures:   none  Antimicrobials:  Anti-infectives (From admission, onward)   Start     Dose/Rate Route Frequency Ordered Stop   12/03/19 1000  remdesivir 100 mg in sodium chloride 0.9 % 100 mL IVPB     100 mg 200 mL/hr over 30 Minutes Intravenous Daily 12/01/2019 1312 12/06/19 1103   12/09/2019 1315  remdesivir 200 mg in sodium chloride 0.9% 250 mL  IVPB     200 mg 580 mL/hr over 30 Minutes Intravenous Once 12/05/2019 1312 12/09/2019 1648     Subjective: Feels ok today About the same as yesterday.  No new complaints.  Objective: Vitals:   12/06/19 0855 12/06/19 0900 12/06/19 1134 12/06/19 1200  BP: 96/69 96/61  117/68  Pulse: (!) 57  60 79  Resp: 17  (!) 34 20  Temp:  97.8 F (36.6 C)  (!) 96.6 F (35.9 C)  TempSrc:  Axillary  Axillary  SpO2: (!) 85%  (!) 87% (!) 86%  Weight:      Height:        Intake/Output Summary (Last 24 hours) at 12/06/2019 1412 Last data filed at 12/06/2019 0500 Gross per 24 hour  Intake 360 ml  Output 1100 ml  Net -740 ml   Filed Weights   12/04/19 0500 12/04/19 0800 12/05/19 0445  Weight: 78.5 kg 78.5 kg 78.4 kg    Examination:  General exam: Appears calm and comfortable  Respiratory system: Clear to auscultation. Sitting up eating breakfast on HHFNC. Cardiovascular system: S1 & S2 heard, RRR.  Gastrointestinal system: Abdomen is nondistended, soft and nontender.  Central nervous system: Alert and oriented. No focal neurological deficits. Extremities: no LEE Skin: No rashes, lesions or ulcers Psychiatry: Judgement and insight appear normal. Mood & affect appropriate.     Data Reviewed: I have personally reviewed following labs and imaging studies  CBC: Recent Labs  Lab 11/27/2019 1023 12/03/19 0500 12/03/19 1008 12/04/19 0450 12/05/19  2013 12/06/19 0430  WBC 6.8 6.1  --  8.9 7.0 7.2  NEUTROABS 5.4  --   --   --  6.3 6.4  HGB 17.5* 15.2 14.3 15.6 15.4 15.5  HCT 51.2 44.0 42.0 46.4 46.5 47.5  MCV 85.3 84.8  --  87.1 86.8 89.0  PLT 167 167  --  218 268 243   Basic Metabolic Panel: Recent Labs  Lab 06-Jul-2019 1023 12/03/19 0500 12/03/19 1008 12/04/19 0450 12/05/19 2013 12/06/19 0430  NA 137 138 140 143 142 139  K 3.1* 4.3 4.0 3.8 4.2 4.2  CL 98 103  --  106 110 105  CO2 26 23  --  25 21* 23  GLUCOSE 227* 150*  --  195* 197* 228*  BUN <5* 9  --  21 22 22   CREATININE  0.76 0.61  --  0.80 0.62 0.69  CALCIUM 8.0* 7.9*  --  7.9* 7.7* 7.7*  MG  --  2.0  --  2.5*  --  2.3  PHOS  --   --   --   --  3.6  --    GFR: Estimated Creatinine Clearance: 92.5 mL/min (by C-G formula based on SCr of 0.69 mg/dL). Liver Function Tests: Recent Labs  Lab 06-Jul-2019 1023 12/04/19 0450 12/05/19 0345 12/05/19 2013 12/06/19 0430  AST 96* 91* 70* 92* 73*  ALT 97* 100* 89* 119* 112*  ALKPHOS 60 55 61 60 62  BILITOT 1.0 1.1 1.1 1.6* 1.3*  PROT 7.1 6.3* 6.0* 6.4* 6.2*  ALBUMIN 3.1* 2.6* 2.5* 3.0* 2.7*   No results for input(s): LIPASE, AMYLASE in the last 168 hours. No results for input(s): AMMONIA in the last 168 hours. Coagulation Profile: No results for input(s): INR, PROTIME in the last 168 hours. Cardiac Enzymes: No results for input(s): CKTOTAL, CKMB, CKMBINDEX, TROPONINI in the last 168 hours. BNP (last 3 results) No results for input(s): PROBNP in the last 8760 hours. HbA1C: No results for input(s): HGBA1C in the last 72 hours. CBG: Recent Labs  Lab 12/05/19 0442 12/05/19 0809 12/05/19 1119 12/05/19 1538 12/05/19 1956  GLUCAP 145* 192* 259* 193* 183*   Lipid Profile: No results for input(s): CHOL, HDL, LDLCALC, TRIG, CHOLHDL, LDLDIRECT in the last 72 hours. Thyroid Function Tests: No results for input(s): TSH, T4TOTAL, FREET4, T3FREE, THYROIDAB in the last 72 hours. Anemia Panel: Recent Labs    12/05/19 2013 12/06/19 0430  FERRITIN 587* 585*   Sepsis Labs: Recent Labs  Lab 06-Jul-2019 1023 06-Jul-2019 1300 12/04/19 0450 12/05/19 0345 12/05/19 2013 12/06/19 0430  PROCALCITON  --   --  <0.10 0.47 <0.10 <0.10  LATICACIDVEN 3.2* 2.0*  --   --   --   --     Recent Results (from the past 240 hour(s))  Blood Culture (routine x 2)     Status: None (Preliminary result)   Collection Time: 06-Jul-2019 11:01 AM   Specimen: BLOOD  Result Value Ref Range Status   Specimen Description BLOOD RIGHT ANTECUBITAL  Final   Special Requests   Final    BOTTLES  DRAWN AEROBIC AND ANAEROBIC Blood Culture adequate volume   Culture   Final    NO GROWTH 4 DAYS Performed at [facility], [address] [place], [place] [address]    Report Status PENDING  Incomplete  Blood Culture (routine x 2)     Status: None (Preliminary result)   Collection Time: 06-Jul-2019 12:05 PM   Specimen: BLOOD  Result Value Ref Range  Status   Specimen Description BLOOD LEFT ANTECUBITAL  Final   Special Requests   Final    BOTTLES DRAWN AEROBIC AND ANAEROBIC Blood Culture adequate volume   Culture   Final    NO GROWTH 4 DAYS Performed at [facility], [address] [place], [place] [address]    Report Status PENDING  Incomplete  MRSA PCR Screening     Status: None   Collection Time: 12/03/19  2:59 AM   Specimen: Nasopharyngeal  Result Value Ref Range Status   MRSA by PCR NEGATIVE NEGATIVE Final    Comment:        The GeneXpert MRSA Assay (FDA approved for NASAL specimens only), is one component of Jama Krichbaum comprehensive MRSA colonization surveillance program. It is not intended to diagnose MRSA infection nor to guide or monitor treatment for MRSA infections. Performed at [facility], [place] [address], [place], [place] [address]          Radiology Studies: No results found.      Scheduled Meds: . Chlorhexidine Gluconate Cloth  6 each Topical Daily  . enoxaparin (LOVENOX) injection  40 mg Subcutaneous Q12H  . feeding supplement (ENSURE ENLIVE)  237 mL Oral TID BM  . insulin aspart  0-15 Units Subcutaneous TID WC  . insulin detemir  0.075 Units/kg Subcutaneous BID  . linagliptin  5 mg Oral Daily  . mouth rinse  15 mL Mouth Rinse BID  . methylPREDNISolone (SOLU-MEDROL) injection  40 mg Intravenous Q12H   Continuous Infusions: . sodium chloride Stopped (12/04/19 0939)  . tocilizumab (ACTEMRA) IV       LOS: 4 days    Time spent: over 30 min. 45 min critical care time due to AHRF 2/2 COVID 19 pneumonia with severe hypoxia requiring  heated high flow.    Fayrene Helper, MD [facility] Pager AMION  If 7PM-7AM, please contact night-coverage [email or web address] Password [email or web address] 12/06/2019, 2:12 PM

## 2019-12-07 LAB — CBC WITH DIFFERENTIAL/PLATELET
Abs Immature Granulocytes: 0.05 10*3/uL (ref 0.00–0.07)
Basophils Absolute: 0 10*3/uL (ref 0.0–0.1)
Basophils Relative: 0 %
Eosinophils Absolute: 0 10*3/uL (ref 0.0–0.5)
Eosinophils Relative: 0 %
HCT: 43.2 % (ref 39.0–52.0)
Hemoglobin: 14.3 g/dL (ref 13.0–17.0)
Immature Granulocytes: 1 %
Lymphocytes Relative: 7 %
Lymphs Abs: 0.5 10*3/uL — ABNORMAL LOW (ref 0.7–4.0)
MCH: 29.2 pg (ref 26.0–34.0)
MCHC: 33.1 g/dL (ref 30.0–36.0)
MCV: 88.3 fL (ref 80.0–100.0)
Monocytes Absolute: 0.2 10*3/uL (ref 0.1–1.0)
Monocytes Relative: 3 %
Neutro Abs: 6.8 10*3/uL (ref 1.7–7.7)
Neutrophils Relative %: 89 %
Platelets: 255 10*3/uL (ref 150–400)
RBC: 4.89 MIL/uL (ref 4.22–5.81)
RDW: 12.9 % (ref 11.5–15.5)
WBC: 7.5 10*3/uL (ref 4.0–10.5)
nRBC: 0 % (ref 0.0–0.2)

## 2019-12-07 LAB — COMPREHENSIVE METABOLIC PANEL
ALT: 77 U/L — ABNORMAL HIGH (ref 0–44)
AST: 36 U/L (ref 15–41)
Albumin: 2.7 g/dL — ABNORMAL LOW (ref 3.5–5.0)
Alkaline Phosphatase: 59 U/L (ref 38–126)
Anion gap: 9 (ref 5–15)
BUN: 25 mg/dL — ABNORMAL HIGH (ref 8–23)
CO2: 24 mmol/L (ref 22–32)
Calcium: 7.9 mg/dL — ABNORMAL LOW (ref 8.9–10.3)
Chloride: 106 mmol/L (ref 98–111)
Creatinine, Ser: 0.53 mg/dL — ABNORMAL LOW (ref 0.61–1.24)
GFR calc Af Amer: >60 mL/min (ref >60)
GFR calc non Af Amer: >60 mL/min (ref >60)
Glucose, Bld: 210 mg/dL — ABNORMAL HIGH (ref 70–99)
Potassium: 4.2 mmol/L (ref 3.5–5.1)
Sodium: 139 mmol/L (ref 135–145)
Total Bilirubin: 1.1 mg/dL (ref 0.3–1.2)
Total Protein: 5.8 g/dL — ABNORMAL LOW (ref 6.5–8.1)

## 2019-12-07 LAB — PREPARE FRESH FROZEN PLASMA

## 2019-12-07 LAB — GLUCOSE, CAPILLARY
Glucose-Capillary: 306 mg/dL — ABNORMAL HIGH (ref 70–99)
Glucose-Capillary: 264 mg/dL — ABNORMAL HIGH (ref 70–99)
Glucose-Capillary: 190 mg/dL — ABNORMAL HIGH (ref 70–99)
Glucose-Capillary: 241 mg/dL — ABNORMAL HIGH (ref 70–99)
Glucose-Capillary: 180 mg/dL — ABNORMAL HIGH (ref 70–99)
Glucose-Capillary: 123 mg/dL — ABNORMAL HIGH (ref 70–99)

## 2019-12-07 LAB — CULTURE, BLOOD (ROUTINE X 2)
Culture: NO GROWTH
Culture: NO GROWTH
Special Requests: ADEQUATE
Special Requests: ADEQUATE

## 2019-12-07 LAB — FERRITIN: Ferritin: 357 ng/mL — ABNORMAL HIGH (ref 24–336)

## 2019-12-07 LAB — BPAM FFP
Blood Product Expiration Date: 202012220053
ISSUE DATE / TIME: 202012210059
Unit Type and Rh: 6200

## 2019-12-07 LAB — C-REACTIVE PROTEIN: CRP: 2.4 mg/dL — ABNORMAL HIGH (ref <1.0)

## 2019-12-07 LAB — D-DIMER, QUANTITATIVE: D-Dimer, Quant: 0.98 ug/mL-FEU — ABNORMAL HIGH (ref 0.00–0.50)

## 2019-12-07 LAB — BRAIN NATRIURETIC PEPTIDE: B Natriuretic Peptide: 48.8 pg/mL (ref 0.0–100.0)

## 2019-12-07 LAB — PHOSPHORUS: Phosphorus: 3.1 mg/dL (ref 2.5–4.6)

## 2019-12-07 LAB — MAGNESIUM: Magnesium: 2.2 mg/dL (ref 1.7–2.4)

## 2019-12-07 LAB — PROCALCITONIN: Procalcitonin: <0.1 ng/mL

## 2019-12-07 MED ORDER — SODIUM CHLORIDE 0.9 % IV BOLUS
500.0000 mL | Freq: Once | INTRAVENOUS | Status: AC
  Administered 2019-12-07: 500 mL via INTRAVENOUS

## 2019-12-07 MED ORDER — LIVING WELL WITH DIABETES BOOK - IN SPANISH
Freq: Once | Status: AC
  Filled 2019-12-07: qty 1

## 2019-12-07 MED ORDER — INSULIN DETEMIR 100 UNIT/ML ~~LOC~~ SOLN
9.0000 [IU] | Freq: Two times a day (BID) | SUBCUTANEOUS | Status: DC
  Administered 2019-12-07 – 2019-12-08 (×4): 9 [IU] via SUBCUTANEOUS
  Filled 2019-12-07 (×5): qty 0.09

## 2019-12-07 NOTE — Progress Notes (Signed)
PROGRESS NOTE    Henry MoorsJesus [name]  ZOX:[id number] DOB: Oct 24, 1956 DOA: 02/01/2019 PCP: Patient, No Pcp Per   Brief Narrative:  63 yo with T2DM presenting with AHRF 2/2 COVID 19 pneumonia.   12/17 Admitted to IMTS.  Started on steroids, remdesivir.   12/18 Placed on HHFNC overnight and transferred to ICU 12/20 Transferred to PCU at [facility] on heated high flow 12/21 Requiring 40 L HHFNC.  Received plasma and actemra.  Assessment & Plan:   Active Problems:   Acute respiratory failure with hypoxia (HCC)   Acute respiratory failure due to COVID-19 [facility](HCC)   Hyperglycemia   Acute hypoxemic respiratory failure (HCC)  Acute Hypoxic Respiratory Failure 2/2 COVID 19 Pneumonia:  CXR 12/19 with bilateral pneumonia with progression from previous images CTA 12/18 without evidence of PE, but notable for multifocal ground glass opacities Remdesivir 12/17 - 12/21 Steroids 12/17 - present.  Plan for 10 days. Plasma x1 12/21. Actemra x1 12/21. Continues to have severe hypoxia requiring 45 L HHFNC I/O, daily weights Encourage proning, IS, OOB Daily inflammatory labs  COVID-19 Labs  Recent Labs    12/05/19 2013 12/06/19 0430 12/07/19 0245  DDIMER 1.22* 1.12* 0.98*  FERRITIN 587* 585* 357*  CRP 4.7* 4.0* 2.4*    No results found for: SARSCOV2NAA  Hypotension: this improved after I adjusted BP cuff in room.  Follow.   Type 2 Diabetes: new diagnosis Levemir.  SSI.  Tradjenta.  Continue to adjust as needed. A1c 7.5  Elevated Liver Enzymes: likely 2/2 COVID infection, follow  DVT prophylaxis: intermediate dose lovenox Code Status: full  Family Communication: none at bedside Disposition Plan: pending further improvement  Consultants:   none  Procedures:   none  Antimicrobials:  Anti-infectives (From admission, onward)   Start     Dose/Rate Route Frequency Ordered Stop   12/03/19 1000  remdesivir 100 mg in sodium chloride 0.9 % 100 mL IVPB     100 mg 200 mL/hr over 30  Minutes Intravenous Daily 2019-11-15 1312 12/06/19 1103   2019-11-15 1315  remdesivir 200 mg in sodium chloride 0.9% 250 mL IVPB     200 mg 580 mL/hr over 30 Minutes Intravenous Once 2019-11-15 1312 2019-11-15 1648     Subjective: No new complaints, feels stable  Objective: Vitals:   12/07/19 0800 12/07/19 0823 12/07/19 0849 12/07/19 1159  BP: (!) 94/59 (!) 82/59 (!) 82/59 122/70  Pulse: (!) 55 60 72 (!) 59  Resp: (!) 38 (!) 22 (!) 24 17  Temp:  98 F (36.7 C)  (!) 97.5 F (36.4 C)  TempSrc:  Axillary  Oral  SpO2: (!) 87% 90% (!) 84% 92%  Weight:      Height:        Intake/Output Summary (Last 24 hours) at 12/07/2019 1338 Last data filed at 12/07/2019 1007 Gross per 24 hour  Intake 180 ml  Output 1550 ml  Net -1370 ml   Filed Weights   12/04/19 0500 12/04/19 0800 12/05/19 0445  Weight: 78.5 kg 78.5 kg 78.4 kg    Examination:  General: No acute distress. Cardiovascular: RRR Lungs: CTAB, on HHFNC, but appears relatively comfortable with ok wob Abdomen: Soft, nontender, nondistended Neurological: Alert and oriented 3. Moves all extremities 4 . Cranial nerves II through XII grossly intact. Skin: Warm and dry. No rashes or lesions. Extremities: No clubbing or cyanosis. No edema.  Data Reviewed: I have personally reviewed following labs and imaging studies  CBC: Recent Labs  Lab 2019-11-15 1023 12/03/19 0500 12/03/19  1008 12/04/19 0450 12/05/19 2013 12/06/19 0430 12/07/19 0245  WBC 6.8 6.1  --  8.9 7.0 7.2 7.5  NEUTROABS 5.4  --   --   --  6.3 6.4 6.8  HGB 17.5* 15.2 14.3 15.6 15.4 15.5 14.3  HCT 51.2 44.0 42.0 46.4 46.5 47.5 43.2  MCV 85.3 84.8  --  87.1 86.8 89.0 88.3  PLT 167 167  --  218 268 243 893   Basic Metabolic Panel: Recent Labs  Lab 12/03/19 0500 12/03/19 1008 12/04/19 0450 12/05/19 2013 12/06/19 0430 12/07/19 0245  NA 138 140 143 142 139 139  K 4.3 4.0 3.8 4.2 4.2 4.2  CL 103  --  106 110 105 106  CO2 23  --  25 21* 23 24  GLUCOSE 150*  --   195* 197* 228* 210*  BUN 9  --  21 22 22  25*  CREATININE 0.61  --  0.80 0.62 0.69 0.53*  CALCIUM 7.9*  --  7.9* 7.7* 7.7* 7.9*  MG 2.0  --  2.5*  --  2.3 2.2  PHOS  --   --   --  3.6  --  3.1   GFR: Estimated Creatinine Clearance: 92.5 mL/min (Roanne Haye) (by C-G formula based on SCr of 0.53 mg/dL (L)). Liver Function Tests: Recent Labs  Lab 12/04/19 0450 12/05/19 0345 12/05/19 2013 12/06/19 0430 12/07/19 0245  AST 91* 70* 92* 73* 36  ALT 100* 89* 119* 112* 77*  ALKPHOS 55 61 60 62 59  BILITOT 1.1 1.1 1.6* 1.3* 1.1  PROT 6.3* 6.0* 6.4* 6.2* 5.8*  ALBUMIN 2.6* 2.5* 3.0* 2.7* 2.7*   No results for input(s): LIPASE, AMYLASE in the last 168 hours. No results for input(s): AMMONIA in the last 168 hours. Coagulation Profile: No results for input(s): INR, PROTIME in the last 168 hours. Cardiac Enzymes: No results for input(s): CKTOTAL, CKMB, CKMBINDEX, TROPONINI in the last 168 hours. BNP (last 3 results) No results for input(s): PROBNP in the last 8760 hours. HbA1C: No results for input(s): HGBA1C in the last 72 hours. CBG: Recent Labs  Lab 12/06/19 1203 12/06/19 1512 12/06/19 2034 12/07/19 0734 12/07/19 1127  GLUCAP 313* 386* 306* 264* 190*   Lipid Profile: No results for input(s): CHOL, HDL, LDLCALC, TRIG, CHOLHDL, LDLDIRECT in the last 72 hours. Thyroid Function Tests: No results for input(s): TSH, T4TOTAL, FREET4, T3FREE, THYROIDAB in the last 72 hours. Anemia Panel: Recent Labs    12/06/19 0430 12/07/19 0245  FERRITIN 585* 357*   Sepsis Labs: Recent Labs  Lab Dec 21, 2019 1023 12/21/19 1300 12/05/19 0345 12/05/19 2013 12/06/19 0430 12/07/19 0245  PROCALCITON  --   --  0.47 <0.10 <0.10 <0.10  LATICACIDVEN 3.2* 2.0*  --   --   --   --     Recent Results (from the past 240 hour(s))  Blood Culture (routine x 2)     Status: None   Collection Time: 2019/12/21 11:01 AM   Specimen: BLOOD  Result Value Ref Range Status   Specimen Description BLOOD RIGHT ANTECUBITAL   Final   Special Requests   Final    BOTTLES DRAWN AEROBIC AND ANAEROBIC Blood Culture adequate volume   Culture   Final    NO GROWTH 5 DAYS Performed at [facility], [place] [address], [place], [place] [address]    Report Status 12/07/2019 FINAL  Final  Blood Culture (routine x 2)     Status: None   Collection Time: 12/21/19 12:05 PM  Specimen: BLOOD  Result Value Ref Range Status   Specimen Description BLOOD LEFT ANTECUBITAL  Final   Special Requests   Final    BOTTLES DRAWN AEROBIC AND ANAEROBIC Blood Culture adequate volume   Culture   Final    NO GROWTH 5 DAYS Performed at [facility], [address], [place], [place] [address]    Report Status 12/07/2019 FINAL  Final  MRSA PCR Screening     Status: None   Collection Time: 12/03/19  2:59 AM   Specimen: Nasopharyngeal  Result Value Ref Range Status   MRSA by PCR NEGATIVE NEGATIVE Final    Comment:        The GeneXpert MRSA Assay (FDA approved for NASAL specimens only), is one component of Ivania Teagarden comprehensive MRSA colonization surveillance program. It is not intended to diagnose MRSA infection nor to guide or monitor treatment for MRSA infections. Performed at [facility], [address] [place], [place] [address]          Radiology Studies: No results found.      Scheduled Meds: . Chlorhexidine Gluconate Cloth  6 each Topical Daily  . enoxaparin (LOVENOX) injection  40 mg Subcutaneous Q12H  . feeding supplement (ENSURE ENLIVE)  237 mL Oral TID BM  . influenza vac split quadrivalent PF  0.5 mL Intramuscular Tomorrow-1000  . insulin aspart  0-20 Units Subcutaneous TID WC  . insulin aspart  0-5 Units Subcutaneous QHS  . insulin aspart  5 Units Subcutaneous TID WC  . insulin detemir  9 Units Subcutaneous BID  . linagliptin  5 mg Oral Daily  . mouth rinse  15 mL Mouth Rinse BID  . methylPREDNISolone (SOLU-MEDROL) injection  40 mg Intravenous Q12H   Continuous Infusions: . sodium chloride  Stopped (12/04/19 0939)     LOS: 5 days    Time spent: over 30 min. 45 min critical care time 2/2 AHRF 2/2 COVID 19 pneumonia    Lacretia Nicks, MD [facility] Pager AMION  If 7PM-7AM, please contact night-coverage [email or web address] Password [email or web address] 12/07/2019, 1:38 PM

## 2019-12-07 NOTE — Plan of Care (Signed)
  Problem: Respiratory: Goal: Complications related to the disease process, condition or treatment will be avoided or minimized Outcome: Progressing   Problem: Respiratory: Goal: Will maintain a patent airway Outcome: Progressing   Problem: Education: Goal: Knowledge of risk factors and measures for prevention of condition will improve Outcome: Progressing

## 2019-12-07 NOTE — Progress Notes (Signed)
MD wrote orders for IV fluid bolus of 546ml to be given this am, started as per orders at 0510, Will monitor effectiveness of above r/t BP's and Urine output, cardiovascular/intracellular status.

## 2019-12-07 NOTE — Progress Notes (Signed)
Pt has been fluctuating w/sats throughout this shift, from 81-89 mostly, with HF and NRB both patent. SB on monitor, RR 30's mostly, afebrile. Desats quickly with sleep, once awake, rebounds nicely,CDB and Inc Spiro cont w/encouragement, side lying most of night. At 430, pts BP dropping with MAPs 53-58, contacted md via message with above info along w/noted decrease in urine output, urine concentrated and +odor starting to be noticeable.  @ 0445, await response.  Pt denies concerns when asked about sx's of hypotension/bradycardia. Monitoring closely continues.

## 2019-12-08 LAB — COMPREHENSIVE METABOLIC PANEL
ALT: 95 U/L — ABNORMAL HIGH (ref 0–44)
AST: 71 U/L — ABNORMAL HIGH (ref 15–41)
Albumin: 2.6 g/dL — ABNORMAL LOW (ref 3.5–5.0)
Alkaline Phosphatase: 63 U/L (ref 38–126)
Anion gap: 8 (ref 5–15)
BUN: 22 mg/dL (ref 8–23)
CO2: 21 mmol/L — ABNORMAL LOW (ref 22–32)
Calcium: 7.8 mg/dL — ABNORMAL LOW (ref 8.9–10.3)
Chloride: 107 mmol/L (ref 98–111)
Creatinine, Ser: 0.55 mg/dL — ABNORMAL LOW (ref 0.61–1.24)
GFR calc Af Amer: >60 mL/min (ref >60)
GFR calc non Af Amer: >60 mL/min (ref >60)
Glucose, Bld: 174 mg/dL — ABNORMAL HIGH (ref 70–99)
Potassium: 4.7 mmol/L (ref 3.5–5.1)
Sodium: 136 mmol/L (ref 135–145)
Total Bilirubin: 1.1 mg/dL (ref 0.3–1.2)
Total Protein: 5.8 g/dL — ABNORMAL LOW (ref 6.5–8.1)

## 2019-12-08 LAB — CBC WITH DIFFERENTIAL/PLATELET
Abs Immature Granulocytes: 0.03 10*3/uL (ref 0.00–0.07)
Basophils Absolute: 0 10*3/uL (ref 0.0–0.1)
Basophils Relative: 0 %
Eosinophils Absolute: 0 10*3/uL (ref 0.0–0.5)
Eosinophils Relative: 0 %
HCT: 44.8 % (ref 39.0–52.0)
Hemoglobin: 14.9 g/dL (ref 13.0–17.0)
Immature Granulocytes: 0 %
Lymphocytes Relative: 6 %
Lymphs Abs: 0.4 10*3/uL — ABNORMAL LOW (ref 0.7–4.0)
MCH: 29 pg (ref 26.0–34.0)
MCHC: 33.3 g/dL (ref 30.0–36.0)
MCV: 87.3 fL (ref 80.0–100.0)
Monocytes Absolute: 0.2 10*3/uL (ref 0.1–1.0)
Monocytes Relative: 2 %
Neutro Abs: 7.1 10*3/uL (ref 1.7–7.7)
Neutrophils Relative %: 92 %
Platelets: 259 10*3/uL (ref 150–400)
RBC: 5.13 MIL/uL (ref 4.22–5.81)
RDW: 12.8 % (ref 11.5–15.5)
WBC: 7.7 10*3/uL (ref 4.0–10.5)
nRBC: 0 % (ref 0.0–0.2)

## 2019-12-08 LAB — PHOSPHORUS
Phosphorus: 3.7 mg/dL (ref 2.5–4.6)
Phosphorus: 3.2 mg/dL (ref 2.5–4.6)

## 2019-12-08 LAB — GLUCOSE, CAPILLARY
Glucose-Capillary: 151 mg/dL — ABNORMAL HIGH (ref 70–99)
Glucose-Capillary: 217 mg/dL — ABNORMAL HIGH (ref 70–99)
Glucose-Capillary: 256 mg/dL — ABNORMAL HIGH (ref 70–99)
Glucose-Capillary: 244 mg/dL — ABNORMAL HIGH (ref 70–99)

## 2019-12-08 LAB — FERRITIN: Ferritin: 389 ng/mL — ABNORMAL HIGH (ref 24–336)

## 2019-12-08 LAB — HEPATITIS PANEL, ACUTE
HCV Ab: NONREACTIVE
Hep A IgM: NONREACTIVE
Hep B C IgM: NONREACTIVE
Hepatitis B Surface Ag: NONREACTIVE

## 2019-12-08 LAB — MAGNESIUM
Magnesium: 2.1 mg/dL (ref 1.7–2.4)
Magnesium: 2.2 mg/dL (ref 1.7–2.4)

## 2019-12-08 LAB — C-REACTIVE PROTEIN: CRP: 1.1 mg/dL — ABNORMAL HIGH (ref <1.0)

## 2019-12-08 LAB — D-DIMER, QUANTITATIVE: D-Dimer, Quant: 1.3 ug/mL-FEU — ABNORMAL HIGH (ref 0.00–0.50)

## 2019-12-08 MED ORDER — GLUCERNA 1.5 CAL PO LIQD
1000.0000 mL | ORAL | Status: DC
  Administered 2019-12-08: 19:00:00 1000 mL
  Administered 2019-12-08: 70 mL/h
  Administered 2019-12-09 – 2019-12-13 (×4): 1000 mL
  Filled 2019-12-08 (×8): qty 1000

## 2019-12-08 MED ORDER — PRO-STAT SUGAR FREE PO LIQD
30.0000 mL | Freq: Two times a day (BID) | ORAL | Status: DC
  Administered 2019-12-08 – 2019-12-14 (×11): 30 mL
  Filled 2019-12-08 (×12): qty 30

## 2019-12-08 MED ORDER — DEXAMETHASONE SODIUM PHOSPHATE 10 MG/ML IJ SOLN
6.0000 mg | INTRAMUSCULAR | Status: AC
  Administered 2019-12-10 – 2019-12-11 (×2): 6 mg via INTRAVENOUS
  Filled 2019-12-08 (×2): qty 1

## 2019-12-08 MED ORDER — INSULIN ASPART 100 UNIT/ML ~~LOC~~ SOLN
7.0000 [IU] | Freq: Three times a day (TID) | SUBCUTANEOUS | Status: DC
  Administered 2019-12-08 – 2019-12-15 (×15): 7 [IU] via SUBCUTANEOUS

## 2019-12-08 NOTE — Progress Notes (Signed)
 Nutrition Follow-up  DOCUMENTATION CODES:   Not applicable  INTERVENTION:   Tube Feeding: Nocturnal Feedings Glucerna 1.5 at 70 ml/hr x 12 hours (1800 to 0600 daily) Pro-Stat 30 mL BID Provides 1460 kcals, 99 g of protein, 638 mL of free water Meets 65% of calorie needs  Ensure Enlive po BID, each supplement provides 350 kcal and 20 grams of protein   NUTRITION DIAGNOSIS:   Inadequate oral intake related to inability to eat as evidenced by NPO status.  Being addressed via TF   GOAL:   Patient will meet greater than or equal to 90% of their needs  Progressing  MONITOR:   Diet advancement, Labs, Weight trends  REASON FOR ASSESSMENT:   Malnutrition Screening Tool    ASSESSMENT:   63 year old male who presented to the ED on 12/17 with SOB and abdominal pain. No known PMH. Pt tested positive for COVID-19.  12/17 Admitted  12/23 Cortrak placed  Recorded po intake 50-80% of meals today; prior to this no recorded po intake. Pt with poor po intake since admission per RN, but appears to have improved with encouragement today.   Labs: reviewed Meds: decadron, solumedrol, ss novolog, novolog with meals, tradjenta  Diet Order:   Diet Order            Diet Carb Modified Fluid consistency: Thin; Room service appropriate? Yes  Diet effective now              EDUCATION NEEDS:   No education needs have been identified at this time  Skin:  Skin Assessment: Reviewed RN Assessment  Last BM:  12/23  Height:   Ht Readings from Last 1 Encounters:  12/04/19 5\' 5"  (1.651 m)    Weight:   Wt Readings from Last 1 Encounters:  12/08/19 65.9 kg    Ideal Body Weight:  (unable to calculate due to no height in chart)  BMI:  Body mass index is 24.18 kg/m.  Estimated Nutritional Needs:   Kcal:  2200-2400  Protein:  110-130 g  Fluid:  >/= 2.0 L    Labarron Durnin MS, RDN, LDN, CNSC [phone] Pager  [phone] Weekend/On-Call Pager

## 2019-12-08 NOTE — Procedures (Signed)
Cortrak  Person Inserting Tube:  Maylon Peppers C, RD Tube Type:  Cortrak - 43 inches Tube Location:  Right nare Initial Placement:  Stomach Secured by: Bridle Technique Used to Measure Tube Placement:  Documented cm marking at nare/ corner of mouth Cortrak Secured At:  65 cm    Cortrak Tube Team Note:  Consult received to place a Cortrak feeding tube.   If the tube becomes dislodged please keep the tube and contact the Cortrak team at [email or web address] (password [email or web address]) for replacement.  If after hours and replacement cannot be delayed, place a NG tube and confirm placement with an abdominal x-ray.    [place], [place], [place] Pager [phone] After Hours Pager

## 2019-12-08 NOTE — Progress Notes (Signed)
Pt states feeling well, ng tube patent, infusing glucerna at 92ml/hr, with 72ml sw flushes q4hours.  Pt states does not want to get out of bed to chair, pm bath in bit. Will continue to monitor appetite/feed tolerance and sats, currently in mid to high 80's, as per md, this is acceptable. This information also relayed in report from [facility] RN, day shift.

## 2019-12-08 NOTE — Progress Notes (Signed)
Pt night was uneventful, turning and positioning in bed w/assist. Diet continues poor, O2 via HHFNC of 40L/100%, pt is afebrile. Using urinal, noted improvement in urine odor and color, possible need for added IVF for bolus/challange, as diet and intake is poor.Report to next shift as planned.

## 2019-12-08 NOTE — Progress Notes (Signed)
PROGRESS NOTE    Henry [name]  IEP:[id number] DOB: [birth year] DOA: 12/07/19 PCP: Patient, No Pcp Per   Brief Narrative:  63 yo with T2DM presenting with AHRF 2/2 COVID 19 pneumonia.   12/17 Admitted to IMTS.  Started on steroids, remdesivir.   12/18 Placed on [place] overnight and transferred to ICU 12/20 Transferred to PCU at [facility] on heated high flow 12/21 Requiring 40 L HHFNC.  Received plasma and actemra.  Assessment & Plan:   Active Problems:   Acute respiratory failure with hypoxia (HCC)   COVID-19   Hyperglycemia   Acute hypoxemic respiratory failure (HCC)  Acute Hypoxic Respiratory Failure 2/2 COVID 19 Pneumonia:  CXR 12/19 with bilateral pneumonia with progression from previous images CTA 12/18 without evidence of PE, but notable for multifocal ground glass opacities Remdesivir 12/17 - 12/21 Steroids 12/17 - present.  Plan for 10 days. Plasma x1 12/21. Actemra x1 12/21. Continues to have severe hypoxia, though slowly improving -> on 20 L HFNC and NRB I/O, daily weights (net negate 4.5 L) Encourage proning, IS, OOB Daily inflammatory labs  COVID-19 Labs  Recent Labs    12/06/19 0430 12/07/19 0245 12/08/19 0350  DDIMER 1.12* 0.98* 1.30*  FERRITIN 585* 357* 389*  CRP 4.0* 2.4* 1.1*    No results found for: SARSCOV2NAA  Hypotension: BP's on softer side, low 660'Y and 30'Z systolic.  Follow closely.  Appears to be asymptomatic from this.  Type 2 Diabetes: new diagnosis Levemir.  SSI.  Tradjenta.  Continue to adjust as needed. A1c 7.5  Elevated Liver Enzymes: likely 2/2 COVID infection, follow  Poor PO intake: improved at lunchtime, took 80% of meal.  Will continue to follow for need for supplementation.  DVT prophylaxis: intermediate dose lovenox Code Status: full  Family Communication: none at bedside Disposition Plan: pending further improvement  Consultants:   none  Procedures:   none  Antimicrobials:  Anti-infectives (From  admission, onward)   Start     Dose/Rate Route Frequency Ordered Stop   12/03/19 1000  remdesivir 100 mg in sodium chloride 0.9 % 100 mL IVPB     100 mg 200 mL/hr over 30 Minutes Intravenous Daily 12-07-2019 1312 12/06/19 1103   December 07, 2019 1315  remdesivir 200 mg in sodium chloride 0.9% 250 mL IVPB     200 mg 580 mL/hr over 30 Minutes Intravenous Once 12-07-2019 1312 12-07-19 1648     Subjective: No new complaints.  Objective: Vitals:   12/08/19 0942 12/08/19 1000 12/08/19 1100 12/08/19 1404  BP:  102/70 (!) 89/74   Pulse: 70 65    Resp: 18 18 (!) 25   Temp:      TempSrc:      SpO2: 95% (!) 88%  96%  Weight:      Height:        Intake/Output Summary (Last 24 hours) at 12/08/2019 1438 Last data filed at 12/08/2019 1400 Gross per 24 hour  Intake 720 ml  Output 1395 ml  Net -675 ml   Filed Weights   12/04/19 0800 12/05/19 0445 12/08/19 0500  Weight: 78.5 kg 78.4 kg 65.9 kg    Examination:  General: No acute distress. Cardiovascular: RRR Lungs: CTAB, no adventitious lung sounds Abdomen: Soft, nontender, nondistended  Neurological: Alert and oriented 3. Moves all extremities 4 . Cranial nerves II through XII grossly intact. Skin: Warm and dry. No rashes or lesions. Extremities: No clubbing or cyanosis. No edema.   Data Reviewed: I have personally reviewed following labs and imaging  studies  CBC: Recent Labs  Lab 12/13/2019 1023 12/03/19 0137 12/04/19 0450 12/05/19 2013 12/06/19 0430 12/07/19 0245 12/08/19 0350  WBC 6.8   < > 8.9 7.0 7.2 7.5 7.7  NEUTROABS 5.4  --   --  6.3 6.4 6.8 7.1  HGB 17.5*  --  15.6 15.4 15.5 14.3 14.9  HCT 51.2  --  46.4 46.5 47.5 43.2 44.8  MCV 85.3   < > 87.1 86.8 89.0 88.3 87.3  PLT 167   < > 218 268 243 255 259   < > = values in this interval not displayed.   Basic Metabolic Panel: Recent Labs  Lab 12/03/19 0500 12/04/19 0450 12/05/19 2013 12/06/19 0430 12/07/19 0245 12/08/19 0350  NA 138 143 142 139 139 136  K 4.3 3.8 4.2  4.2 4.2 4.7  CL 103 106 110 105 106 107  CO2 23 25 21* 23 24 21*  GLUCOSE 150* 195* 197* 228* 210* 174*  BUN 9 21 22 22  25* 22  CREATININE 0.61 0.80 0.62 0.69 0.53* 0.55*  CALCIUM 7.9* 7.9* 7.7* 7.7* 7.9* 7.8*  MG 2.0 2.5*  --  2.3 2.2 2.1  PHOS  --   --  3.6  --  3.1 3.7   GFR: Estimated Creatinine Clearance: 83.3 mL/min ([place]) (by C-G formula based on SCr of 0.55 mg/dL (L)). Liver Function Tests: Recent Labs  Lab 12/05/19 0345 12/05/19 2013 12/06/19 0430 12/07/19 0245 12/08/19 0350  AST 70* 92* 73* 36 71*  ALT 89* 119* 112* 77* 95*  ALKPHOS 61 60 62 59 63  BILITOT 1.1 1.6* 1.3* 1.1 1.1  PROT 6.0* 6.4* 6.2* 5.8* 5.8*  ALBUMIN 2.5* 3.0* 2.7* 2.7* 2.6*   No results for input(s): LIPASE, AMYLASE in the last 168 hours. No results for input(s): AMMONIA in the last 168 hours. Coagulation Profile: No results for input(s): INR, PROTIME in the last 168 hours. Cardiac Enzymes: No results for input(s): CKTOTAL, CKMB, CKMBINDEX, TROPONINI in the last 168 hours. BNP (last 3 results) No results for input(s): PROBNP in the last 8760 hours. HbA1C: No results for input(s): HGBA1C in the last 72 hours. CBG: Recent Labs  Lab 12/07/19 1540 12/07/19 1707 12/07/19 2050 12/08/19 0750 12/08/19 1116  GLUCAP 241* 180* 123* 151* 217*   Lipid Profile: No results for input(s): CHOL, HDL, LDLCALC, TRIG, CHOLHDL, LDLDIRECT in the last 72 hours. Thyroid Function Tests: No results for input(s): TSH, T4TOTAL, FREET4, T3FREE, THYROIDAB in the last 72 hours. Anemia Panel: Recent Labs    12/07/19 0245 12/08/19 0350  FERRITIN 357* 389*   Sepsis Labs: Recent Labs  Lab 12/01/2019 1023 11/22/2019 1300 12/05/19 0345 12/05/19 2013 12/06/19 0430 12/07/19 0245  PROCALCITON  --   --  0.47 <0.10 <0.10 <0.10  LATICACIDVEN 3.2* 2.0*  --   --   --   --     Recent Results (from the past 240 hour(s))  Blood Culture (routine x 2)     Status: None   Collection Time: 12/08/2019 11:01 AM   Specimen: BLOOD    Result Value Ref Range Status   Specimen Description BLOOD RIGHT ANTECUBITAL  Final   Special Requests   Final    BOTTLES DRAWN AEROBIC AND ANAEROBIC Blood Culture adequate volume   Culture   Final    NO GROWTH 5 DAYS Performed at [facility], [address], [place], [place]    Report Status 12/07/2019 FINAL  Final  Blood Culture (routine x 2)  Status: None   Collection Time: 01/21/2019 12:05 PM   Specimen: BLOOD  Result Value Ref Range Status   Specimen Description BLOOD LEFT ANTECUBITAL  Final   Special Requests   Final    BOTTLES DRAWN AEROBIC AND ANAEROBIC Blood Culture adequate volume   Culture   Final    NO GROWTH 5 DAYS Performed at [facility], [address] [place], [place] [address]    Report Status 12/07/2019 FINAL  Final  MRSA PCR Screening     Status: None   Collection Time: 12/03/19  2:59 AM   Specimen: Nasopharyngeal  Result Value Ref Range Status   MRSA by PCR NEGATIVE NEGATIVE Final    Comment:        The GeneXpert MRSA Assay (FDA approved for NASAL specimens only), is one component of Niranjan Rufener comprehensive MRSA colonization surveillance program. It is not intended to diagnose MRSA infection nor to guide or monitor treatment for MRSA infections. Performed at [facility], [address] [place], [place] [address]          Radiology Studies: No results found.      Scheduled Meds: . Chlorhexidine Gluconate Cloth  6 each Topical Daily  . enoxaparin (LOVENOX) injection  40 mg Subcutaneous Q12H  . feeding supplement (ENSURE ENLIVE)  237 mL Oral TID BM  . influenza vac split quadrivalent PF  0.5 mL Intramuscular Tomorrow-1000  . insulin aspart  0-20 Units Subcutaneous TID WC  . insulin aspart  0-5 Units Subcutaneous QHS  . insulin aspart  5 Units Subcutaneous TID WC  . insulin detemir  9 Units Subcutaneous BID  . linagliptin  5 mg Oral Daily  . mouth rinse  15 mL Mouth Rinse BID  . methylPREDNISolone (SOLU-MEDROL)  injection  40 mg Intravenous Q12H   Continuous Infusions: . sodium chloride Stopped (12/04/19 0939)     LOS: 6 days    Time spent: over 30 min.     Lacretia Nicksaldwell Powell, MD [facility] Pager AMION  If 7PM-7AM, please contact night-coverage [email or web address] Password [email or web address] 12/08/2019, 2:38 PM

## 2019-12-09 ENCOUNTER — Inpatient Hospital Stay (HOSPITAL_COMMUNITY): Payer: HRSA Program

## 2019-12-09 LAB — CBC WITH DIFFERENTIAL/PLATELET
Abs Immature Granulocytes: 0.07 10*3/uL (ref 0.00–0.07)
Basophils Absolute: 0 10*3/uL (ref 0.0–0.1)
Basophils Relative: 0 %
Eosinophils Absolute: 0 10*3/uL (ref 0.0–0.5)
Eosinophils Relative: 0 %
HCT: 47 % (ref 39.0–52.0)
Hemoglobin: 15.6 g/dL (ref 13.0–17.0)
Immature Granulocytes: 1 %
Lymphocytes Relative: 3 %
Lymphs Abs: 0.3 10*3/uL — ABNORMAL LOW (ref 0.7–4.0)
MCH: 28.9 pg (ref 26.0–34.0)
MCHC: 33.2 g/dL (ref 30.0–36.0)
MCV: 87.2 fL (ref 80.0–100.0)
Monocytes Absolute: 0.2 10*3/uL (ref 0.1–1.0)
Monocytes Relative: 2 %
Neutro Abs: 8.3 10*3/uL — ABNORMAL HIGH (ref 1.7–7.7)
Neutrophils Relative %: 94 %
Platelets: 260 10*3/uL (ref 150–400)
RBC: 5.39 MIL/uL (ref 4.22–5.81)
RDW: 13 % (ref 11.5–15.5)
WBC: 8.9 10*3/uL (ref 4.0–10.5)
nRBC: 0 % (ref 0.0–0.2)

## 2019-12-09 LAB — COMPREHENSIVE METABOLIC PANEL
ALT: 118 U/L — ABNORMAL HIGH (ref 0–44)
AST: 67 U/L — ABNORMAL HIGH (ref 15–41)
Albumin: 2.6 g/dL — ABNORMAL LOW (ref 3.5–5.0)
Alkaline Phosphatase: 71 U/L (ref 38–126)
Anion gap: 9 (ref 5–15)
BUN: 32 mg/dL — ABNORMAL HIGH (ref 8–23)
CO2: 22 mmol/L (ref 22–32)
Calcium: 8.2 mg/dL — ABNORMAL LOW (ref 8.9–10.3)
Chloride: 107 mmol/L (ref 98–111)
Creatinine, Ser: 0.67 mg/dL (ref 0.61–1.24)
GFR calc Af Amer: >60 mL/min (ref >60)
GFR calc non Af Amer: >60 mL/min (ref >60)
Glucose, Bld: 228 mg/dL — ABNORMAL HIGH (ref 70–99)
Potassium: 4.4 mmol/L (ref 3.5–5.1)
Sodium: 138 mmol/L (ref 135–145)
Total Bilirubin: 0.9 mg/dL (ref 0.3–1.2)
Total Protein: 5.8 g/dL — ABNORMAL LOW (ref 6.5–8.1)

## 2019-12-09 LAB — C-REACTIVE PROTEIN: CRP: 0.9 mg/dL (ref <1.0)

## 2019-12-09 LAB — D-DIMER, QUANTITATIVE: D-Dimer, Quant: 1.57 ug/mL-FEU — ABNORMAL HIGH (ref 0.00–0.50)

## 2019-12-09 LAB — PHOSPHORUS
Phosphorus: 3.3 mg/dL (ref 2.5–4.6)
Phosphorus: 3.5 mg/dL (ref 2.5–4.6)

## 2019-12-09 LAB — MAGNESIUM
Magnesium: 2.2 mg/dL (ref 1.7–2.4)
Magnesium: 2.2 mg/dL (ref 1.7–2.4)

## 2019-12-09 LAB — FERRITIN: Ferritin: 385 ng/mL — ABNORMAL HIGH (ref 24–336)

## 2019-12-09 LAB — GLUCOSE, CAPILLARY
Glucose-Capillary: 252 mg/dL — ABNORMAL HIGH (ref 70–99)
Glucose-Capillary: 264 mg/dL — ABNORMAL HIGH (ref 70–99)
Glucose-Capillary: 290 mg/dL — ABNORMAL HIGH (ref 70–99)
Glucose-Capillary: 316 mg/dL — ABNORMAL HIGH (ref 70–99)

## 2019-12-09 MED ORDER — INSULIN DETEMIR 100 UNIT/ML ~~LOC~~ SOLN
12.0000 [IU] | Freq: Two times a day (BID) | SUBCUTANEOUS | Status: DC
  Administered 2019-12-09 (×2): 12 [IU] via SUBCUTANEOUS
  Filled 2019-12-09 (×3): qty 0.12

## 2019-12-09 NOTE — Progress Notes (Signed)
PROGRESS NOTE    Henry [name]  OYD:[id number] DOB: [birth year] DOA: December 21, 2019 PCP: Patient, No Pcp Per   Brief Narrative:  63 yo with T2DM presenting with AHRF 2/2 COVID 19 pneumonia.   12/17 Admitted to IMTS.  Started on steroids, remdesivir.   12/18 Placed on [place] overnight and transferred to ICU 12/20 Transferred to PCU at [facility] on heated high flow 12/21 Requiring 40 L HHFNC.  Received plasma and actemra.  Assessment & Plan:   Active Problems:   Acute respiratory failure with hypoxia (HCC)   COVID-19   Hyperglycemia   Acute hypoxemic respiratory failure (HCC)  Acute Hypoxic Respiratory Failure 2/2 COVID 19 Pneumonia:  CXR 12/19 with bilateral pneumonia with progression from previous images CTA 12/18 without evidence of PE, but notable for multifocal ground glass opacities Remdesivir 12/17 - 12/21 Steroids 12/17 - present.  Plan for 10 days. Plasma x1 12/21. Actemra x1 12/21. Relatively stable with severe hypoxia, doing ok on HFNC and NRB I/O, daily weights (net negate 4.5 L) Encourage proning, IS, OOB Daily inflammatory labs  COVID-19 Labs  Recent Labs    12/07/19 0245 12/08/19 0350 12/09/19 0250  DDIMER 0.98* 1.30* 1.57*  FERRITIN 357* 389* 385*  CRP 2.4* 1.1* 0.9    No results found for: SARSCOV2NAA  Hypotension: BP's on softer side, low 672'C and 94'B systolic.  Follow closely.  Appears to be asymptomatic from this.  Type 2 Diabetes: new diagnosis Levemir.  SSI.  Tradjenta.  Continue to adjust as needed. A1c 7.5  Elevated Liver Enzymes: likely 2/2 COVID infection, follow.  Negative acute hepatitis panel.  Poor PO intake: he's got cortrak, continue to encourage PO.  DVT prophylaxis: intermediate dose lovenox Code Status: full  Family Communication: none at bedside Disposition Plan: pending further improvement  Consultants:   none  Procedures:   none  Antimicrobials:  Anti-infectives (From admission, onward)   Start     Dose/Rate  Route Frequency Ordered Stop   12/03/19 1000  remdesivir 100 mg in sodium chloride 0.9 % 100 mL IVPB     100 mg 200 mL/hr over 30 Minutes Intravenous Daily 2019-12-21 1312 12/06/19 1103   21-Dec-2019 1315  remdesivir 200 mg in sodium chloride 0.9% 250 mL IVPB     200 mg 580 mL/hr over 30 Minutes Intravenous Once Dec 21, 2019 1312 2019/12/21 1648     Subjective: Feels stable, no new complaints  Objective: Vitals:   12/09/19 0933 12/09/19 1200 12/09/19 1513 12/09/19 1600  BP: 110/68 116/73  99/74  Pulse:   (!) 108   Resp: 20 (!) 31 (!) 28 (!) 25  Temp:  (!) 97.5 F (36.4 C)  97.6 F (36.4 C)  TempSrc:  Oral  Axillary  SpO2: 96% 94% 93% 98%  Weight:      Height:        Intake/Output Summary (Last 24 hours) at 12/09/2019 1859 Last data filed at 12/09/2019 1600 Gross per 24 hour  Intake 1500 ml  Output 875 ml  Net 625 ml   Filed Weights   12/04/19 0800 12/05/19 0445 12/08/19 0500  Weight: 78.5 kg 78.4 kg 65.9 kg    Examination:  General: No acute distress. Cardiovascular: RRR Lungs: Clear to auscultation bilaterally, stable on 30 L HFNC and NRB Abdomen: Soft, nontender, nondistended Neurological: Alert and oriented 3. Moves all extremities 4 . Cranial nerves II through XII grossly intact. Skin: Warm and dry. No rashes or lesions. Extremities: No clubbing or cyanosis. No edema.    Data Reviewed:  I have personally reviewed following labs and imaging studies  CBC: Recent Labs  Lab 12/05/19 2013 12/06/19 0430 12/07/19 0245 12/08/19 0350 12/09/19 0250  WBC 7.0 7.2 7.5 7.7 8.9  NEUTROABS 6.3 6.4 6.8 7.1 8.3*  HGB 15.4 15.5 14.3 14.9 15.6  HCT 46.5 47.5 43.2 44.8 47.0  MCV 86.8 89.0 88.3 87.3 87.2  PLT 268 243 255 259 260   Basic Metabolic Panel: Recent Labs  Lab 12/04/19 0450 12/05/19 2013 12/06/19 0430 12/07/19 0245 12/08/19 0350 12/08/19 1630 12/09/19 0250 12/09/19 1630  NA  --  142 139 139 136  --  138  --   K  --  4.2 4.2 4.2 4.7  --  4.4  --   CL  --   110 105 106 107  --  107  --   CO2  --  21* 23 24 21*  --  22  --   GLUCOSE  --  197* 228* 210* 174*  --  228*  --   BUN  --  22 22 25* 22  --  32*  --   CREATININE  --  0.62 0.69 0.53* 0.55*  --  0.67  --   CALCIUM  --  7.7* 7.7* 7.9* 7.8*  --  8.2*  --   MG  --   --  2.3 2.2 2.1 2.2 2.2 2.2  PHOS   < > 3.6  --  3.1 3.7 3.2 3.3 3.5   < > = values in this interval not displayed.   GFR: Estimated Creatinine Clearance: 83.3 mL/min (by C-G formula based on SCr of 0.67 mg/dL). Liver Function Tests: Recent Labs  Lab 12/05/19 2013 12/06/19 0430 12/07/19 0245 12/08/19 0350 12/09/19 0250  AST 92* 73* 36 71* 67*  ALT 119* 112* 77* 95* 118*  ALKPHOS 60 62 59 63 71  BILITOT 1.6* 1.3* 1.1 1.1 0.9  PROT 6.4* 6.2* 5.8* 5.8* 5.8*  ALBUMIN 3.0* 2.7* 2.7* 2.6* 2.6*   No results for input(s): LIPASE, AMYLASE in the last 168 hours. No results for input(s): AMMONIA in the last 168 hours. Coagulation Profile: No results for input(s): INR, PROTIME in the last 168 hours. Cardiac Enzymes: No results for input(s): CKTOTAL, CKMB, CKMBINDEX, TROPONINI in the last 168 hours. BNP (last 3 results) No results for input(s): PROBNP in the last 8760 hours. HbA1C: No results for input(s): HGBA1C in the last 72 hours. CBG: Recent Labs  Lab 12/08/19 1610 12/08/19 2129 12/09/19 0746 12/09/19 1135 12/09/19 1638  GLUCAP 256* 244* 290* 252* 316*   Lipid Profile: No results for input(s): CHOL, HDL, LDLCALC, TRIG, CHOLHDL, LDLDIRECT in the last 72 hours. Thyroid Function Tests: No results for input(s): TSH, T4TOTAL, FREET4, T3FREE, THYROIDAB in the last 72 hours. Anemia Panel: Recent Labs    12/08/19 0350 12/09/19 0250  FERRITIN 389* 385*   Sepsis Labs: Recent Labs  Lab 12/05/19 0345 12/05/19 2013 12/06/19 0430 12/07/19 0245  PROCALCITON 0.47 <0.10 <0.10 <0.10    Recent Results (from the past 240 hour(s))  Blood Culture (routine x 2)     Status: None   Collection Time: Dec 19, 2019 11:01 AM    Specimen: BLOOD  Result Value Ref Range Status   Specimen Description BLOOD RIGHT ANTECUBITAL  Final   Special Requests   Final    BOTTLES DRAWN AEROBIC AND ANAEROBIC Blood Culture adequate volume   Culture   Final    NO GROWTH 5 DAYS Performed at [facility], [address], [place],  [place] [address]    Report Status 12/07/2019 FINAL  Final  Blood Culture (routine x 2)     Status: None   Collection Time: 2019/01/15 12:05 PM   Specimen: BLOOD  Result Value Ref Range Status   Specimen Description BLOOD LEFT ANTECUBITAL  Final   Special Requests   Final    BOTTLES DRAWN AEROBIC AND ANAEROBIC Blood Culture adequate volume   Culture   Final    NO GROWTH 5 DAYS Performed at [facility], [address] [place], [place] [address]    Report Status 12/07/2019 FINAL  Final  MRSA PCR Screening     Status: None   Collection Time: 12/03/19  2:59 AM   Specimen: Nasopharyngeal  Result Value Ref Range Status   MRSA by PCR NEGATIVE NEGATIVE Final    Comment:        The GeneXpert MRSA Assay (FDA approved for NASAL specimens only), is one component of Akul Leggette comprehensive MRSA colonization surveillance program. It is not intended to diagnose MRSA infection nor to guide or monitor treatment for MRSA infections. Performed at [facility], [address] [place], [place] [address]          Radiology Studies: DG CHEST PORT 1 VIEW  Result Date: 12/09/2019 CLINICAL DATA:  Shortness of breath EXAM: PORTABLE CHEST 1 VIEW COMPARISON:  With 12/04/2019 FINDINGS: Stable mild elevation of the right hemidiaphragm. Low lung volumes. Patchy bilateral airspace opacities are again noted, slightly improved on the right since prior study. Heart is normal size. No visible effusions or acute bony abnormality. IMPRESSION: Very low lung volumes.  Mild elevation of the right hemidiaphragm. Patchy bilateral airspace disease, improved on the right since prior study. Electronically Signed   By:  Charlett NoseKevin  Dover M.D.   On: 12/09/2019 10:51        Scheduled Meds: . Chlorhexidine Gluconate Cloth  6 each Topical Daily  . [START ON 12/10/2019] dexamethasone (DECADRON) injection  6 mg Intravenous Q24H  . enoxaparin (LOVENOX) injection  40 mg Subcutaneous Q12H  . feeding supplement (ENSURE ENLIVE)  237 mL Oral TID BM  . feeding supplement (GLUCERNA 1.5 CAL)  1,000 mL Per Tube Q24H  . feeding supplement (PRO-STAT SUGAR FREE 64)  30 mL Per Tube BID  . influenza vac split quadrivalent PF  0.5 mL Intramuscular Tomorrow-1000  . insulin aspart  0-20 Units Subcutaneous TID WC  . insulin aspart  0-5 Units Subcutaneous QHS  . insulin aspart  7 Units Subcutaneous TID WC  . insulin detemir  12 Units Subcutaneous BID  . linagliptin  5 mg Oral Daily  . mouth rinse  15 mL Mouth Rinse BID  . methylPREDNISolone (SOLU-MEDROL) injection  40 mg Intravenous Q12H   Continuous Infusions: . sodium chloride Stopped (12/04/19 0939)     LOS: 7 days    Time spent: over 30 min.     Lacretia Nicksaldwell Powell, MD [facility] Pager AMION  If 7PM-7AM, please contact night-coverage [email or web address] Password [email or web address] 12/09/2019, 6:59 PM

## 2019-12-09 NOTE — Progress Notes (Signed)
Pt continues on HHF, now at 30L /80% Pt offers no complaints, using incentive spuirometer with encouragement. NG feeds continue at 81ml/hr with 15ml SW q 4h. Pt denies any GI sx's. Afebrile, CBG continue with coverage. Report to be given to next shift at bedside, Pt essentially unremarkable throughout this pm shift.

## 2019-12-10 LAB — CBC WITH DIFFERENTIAL/PLATELET
Abs Immature Granulocytes: 0.1 10*3/uL — ABNORMAL HIGH (ref 0.00–0.07)
Basophils Absolute: 0 10*3/uL (ref 0.0–0.1)
Basophils Relative: 0 %
Eosinophils Absolute: 0 10*3/uL (ref 0.0–0.5)
Eosinophils Relative: 0 %
HCT: 49.9 % (ref 39.0–52.0)
Hemoglobin: 16.4 g/dL (ref 13.0–17.0)
Immature Granulocytes: 1 %
Lymphocytes Relative: 3 %
Lymphs Abs: 0.3 10*3/uL — ABNORMAL LOW (ref 0.7–4.0)
MCH: 28.9 pg (ref 26.0–34.0)
MCHC: 32.9 g/dL (ref 30.0–36.0)
MCV: 87.9 fL (ref 80.0–100.0)
Monocytes Absolute: 0.2 10*3/uL (ref 0.1–1.0)
Monocytes Relative: 2 %
Neutro Abs: 11.5 10*3/uL — ABNORMAL HIGH (ref 1.7–7.7)
Neutrophils Relative %: 94 %
Platelets: 281 10*3/uL (ref 150–400)
RBC: 5.68 MIL/uL (ref 4.22–5.81)
RDW: 13.2 % (ref 11.5–15.5)
WBC: 12.1 10*3/uL — ABNORMAL HIGH (ref 4.0–10.5)
nRBC: 0.2 % (ref 0.0–0.2)

## 2019-12-10 LAB — GLUCOSE, CAPILLARY
Glucose-Capillary: 233 mg/dL — ABNORMAL HIGH (ref 70–99)
Glucose-Capillary: 194 mg/dL — ABNORMAL HIGH (ref 70–99)
Glucose-Capillary: 328 mg/dL — ABNORMAL HIGH (ref 70–99)

## 2019-12-10 LAB — D-DIMER, QUANTITATIVE: D-Dimer, Quant: 1.66 ug/mL-FEU — ABNORMAL HIGH (ref 0.00–0.50)

## 2019-12-10 LAB — COMPREHENSIVE METABOLIC PANEL
ALT: 125 U/L — ABNORMAL HIGH (ref 0–44)
AST: 51 U/L — ABNORMAL HIGH (ref 15–41)
Albumin: 2.8 g/dL — ABNORMAL LOW (ref 3.5–5.0)
Alkaline Phosphatase: 79 U/L (ref 38–126)
Anion gap: 13 (ref 5–15)
BUN: 32 mg/dL — ABNORMAL HIGH (ref 8–23)
CO2: 21 mmol/L — ABNORMAL LOW (ref 22–32)
Calcium: 8.3 mg/dL — ABNORMAL LOW (ref 8.9–10.3)
Chloride: 105 mmol/L (ref 98–111)
Creatinine, Ser: 0.69 mg/dL (ref 0.61–1.24)
GFR calc Af Amer: >60 mL/min (ref >60)
GFR calc non Af Amer: >60 mL/min (ref >60)
Glucose, Bld: 325 mg/dL — ABNORMAL HIGH (ref 70–99)
Potassium: 4.8 mmol/L (ref 3.5–5.1)
Sodium: 139 mmol/L (ref 135–145)
Total Bilirubin: 0.6 mg/dL (ref 0.3–1.2)
Total Protein: 6 g/dL — ABNORMAL LOW (ref 6.5–8.1)

## 2019-12-10 LAB — PHOSPHORUS: Phosphorus: 3.5 mg/dL (ref 2.5–4.6)

## 2019-12-10 LAB — MAGNESIUM: Magnesium: 2.2 mg/dL (ref 1.7–2.4)

## 2019-12-10 LAB — FERRITIN: Ferritin: 307 ng/mL (ref 24–336)

## 2019-12-10 LAB — C-REACTIVE PROTEIN: CRP: 1.1 mg/dL — ABNORMAL HIGH (ref <1.0)

## 2019-12-10 MED ORDER — INSULIN DETEMIR 100 UNIT/ML ~~LOC~~ SOLN
14.0000 [IU] | Freq: Two times a day (BID) | SUBCUTANEOUS | Status: DC
  Administered 2019-12-10 – 2019-12-11 (×3): 14 [IU] via SUBCUTANEOUS
  Filled 2019-12-10 (×4): qty 0.14

## 2019-12-10 NOTE — Progress Notes (Signed)
Henry [name], Henry [name], Henry [name]   Brief Narrative:  63 yo with T2DM presenting with AHRF 2/2 COVID 19 pneumonia.   12/17 Admitted to IMTS.  Started on steroids, remdesivir.   12/18 Placed on [place] overnight and transferred to ICU 12/20 Transferred to PCU at [facility] on heated high flow 12/21 Requiring 40 L HHFNC.  Received plasma and actemra.  Assessment & Plan:   Active Problems:   Acute respiratory failure with hypoxia (HCC)   COVID-19   Hyperglycemia   Acute hypoxemic respiratory failure (HCC)  Acute Hypoxic Respiratory Failure 2/2 COVID 19 Pneumonia:  CXR 12/19 with bilateral pneumonia with progression from previous images CTA 12/18 without evidence of PE, but notable for multifocal ground glass opacities CXR 12/24 with patchy bilateral airspace disease Remdesivir 12/17 - 12/21 Steroids 12/17 - present.  Plan for 10 days. Plasma x1 12/21. Actemra x1 12/21. Continues on 30 L HFNC at 80% FiO2 with NRB - satting in 90's I/O, daily weights (net negate 3.6 L) Encourage proning, IS, OOB Daily inflammatory labs  COVID-19 Labs  Recent Labs    12/08/19 0350 12/09/19 0250 12/10/19 0830  DDIMER 1.30* 1.57* 1.66*  FERRITIN 389* 385* 307  CRP 1.1* 0.9 1.1*    Henry results found for: SARSCOV2NAA  Hypotension: BP's on softer side, low 109'N and 23'F systolic.  Follow closely.  Appears to be asymptomatic from this.  Type 2 Diabetes: new diagnosis Levemir.  SSI.  Tradjenta.  Continue to adjust as needed. A1c 7.5  Elevated Liver Enzymes: likely 2/2 COVID infection, follow, relatively stable.  Negative acute hepatitis panel.  Poor PO intake: he's got cortrak, tube feeds [name] RD, continue to encourage PO.  DVT prophylaxis: intermediate dose lovenox Code Status: full  Family Communication: none at bedside Disposition Plan: pending further improvement  Consultants:   none  Procedures:   none   Antimicrobials:  Anti-infectives (From admission, onward)   Start     Dose/Rate Route Frequency Ordered Stop   12/03/19 1000  remdesivir 100 mg in sodium chloride 0.9 % 100 mL IVPB     100 mg 200 mL/hr over 30 Minutes Intravenous Daily 11/18/2019 1312 12/06/19 1103   12/13/2019 1315  remdesivir 200 mg in sodium chloride 0.9% 250 mL IVPB     200 mg 580 mL/hr over 30 Minutes Intravenous Once 11/29/2019 1312 12/[name]/2020 1648     Subjective: Henry new complaints  Objective: Vitals:   12/10/19 0725 12/10/19 0905 12/10/19 1235 12/10/19 1321  BP: 112/61  107/72   Pulse: 72 87 74   Resp: (!) 31  (!) 31   Temp: 98.3 F (36.8 C)  97.8 F (36.6 C)   TempSrc: Oral  Oral   SpO2: 94% 91% 96% 96%  Weight:      Height:        Intake/Output Summary (Last 24 hours) at 12/10/2019 1357 Last data filed at 12/10/2019 1323 Gross [name] 24 hour  Intake 1980.17 ml  Output 800 ml  Net 1180.17 ml   Filed Weights   12/04/19 0800 12/05/19 0445 12/08/19 0500  Weight: 78.5 kg 78.4 kg 65.9 kg    Examination:  General: Henry acute distress. Cardiovascular: RRR Lungs: Henry adventitious lung sounds Abdomen: Soft, nontender, nondistended Neurological: Alert and oriented 3. Moves all extremities 4. Cranial nerves II through XII grossly intact. Skin: Warm and dry. Henry rashes or lesions. Extremities: Henry clubbing or cyanosis. Henry edema.  Data Reviewed: I have personally reviewed following labs and imaging studies  CBC: Recent Labs  Lab 12/06/19 0430 12/07/19 0245 12/08/19 0350 12/09/19 0250 12/10/19 0830  WBC 7.2 7.5 7.7 8.9 12.1*  NEUTROABS 6.4 6.8 7.1 8.3* 11.5*  HGB 15.5 14.3 14.9 15.6 16.4  HCT 47.5 43.2 44.8 47.0 49.9  MCV 89.0 88.3 87.3 87.2 87.9  PLT 243 255 259 260 281   Basic Metabolic Panel: Recent Labs  Lab 12/06/19 0430 12/07/19 0245 12/08/19 0350 12/08/19 1630 12/09/19 0250 12/09/19 1630 12/10/19 0830  NA 139 139 136  --  138  --  139  K 4.2 4.2 4.7  --  4.4  --  4.8  CL 105 106  107  --  107  --  105  CO2 23 24 21*  --  22  --  21*  GLUCOSE 228* 210* 174*  --  228*  --  325*  BUN 22 25* 22  --  32*  --  32*  CREATININE 0.69 0.53* 0.55*  --  0.67  --  0.69  CALCIUM 7.7* 7.9* 7.8*  --  8.2*  --  8.3*  MG 2.3 2.2 2.1 2.2 2.2 2.2 2.2  PHOS  --  3.1 3.7 3.2 3.3 3.5 3.5   GFR: Estimated Creatinine Clearance: 83.3 mL/min (by C-G formula based on SCr of 0.69 mg/dL). Liver Function Tests: Recent Labs  Lab 12/06/19 0430 12/07/19 0245 12/08/19 0350 12/09/19 0250 12/10/19 0830  AST 73* 36 71* 67* 51*  ALT 112* 77* 95* 118* 125*  ALKPHOS 62 59 63 71 79  BILITOT 1.3* 1.1 1.1 0.9 0.6  PROT 6.2* 5.8* 5.8* 5.8* 6.0*  ALBUMIN 2.7* 2.7* 2.6* 2.6* 2.8*   Henry results for input(s): LIPASE, AMYLASE in the last 168 hours. Henry results for input(s): AMMONIA in the last 168 hours. Coagulation Profile: Henry results for input(s): INR, PROTIME in the last 168 hours. Cardiac Enzymes: Henry results for input(s): CKTOTAL, CKMB, CKMBINDEX, TROPONINI in the last 168 hours. BNP (last 3 results) Henry results for input(s): PROBNP in the last 8760 hours. HbA1C: Henry results for input(s): HGBA1C in the last 72 hours. CBG: Recent Labs  Lab 12/09/19 1135 12/09/19 1638 12/09/19 2334 12/10/19 0737 12/10/19 1230  GLUCAP 252* 316* 264* 233* 194*   Lipid Profile: Henry results for input(s): CHOL, HDL, LDLCALC, TRIG, CHOLHDL, LDLDIRECT in the last 72 hours. Thyroid Function Tests: Henry results for input(s): TSH, T4TOTAL, FREET4, T3FREE, THYROIDAB in the last 72 hours. Anemia Panel: Recent Labs    12/09/19 0250 12/10/19 0830  FERRITIN 385* 307   Sepsis Labs: Recent Labs  Lab 12/05/19 0345 12/05/19 2013 12/06/19 0430 12/07/19 0245  PROCALCITON 0.47 <0.10 <0.10 <0.10    Recent Results (from the past 240 hour(s))  Blood Culture (routine x 2)     Status: None   Collection Time: 12/06/2019 11:01 AM   Specimen: BLOOD  Result Value Ref Range Status   Specimen Description BLOOD RIGHT  ANTECUBITAL  Final   Special Requests   Final    BOTTLES DRAWN AEROBIC AND ANAEROBIC Blood Culture adequate volume   Culture   Final    Henry GROWTH 5 DAYS Performed at [facility], [address] [place], [place] [address]    Report Status 12/07/2019 FINAL  Final  Blood Culture (routine x 2)     Status: None   Collection Time: 11/22/2019 12:05 PM   Specimen: BLOOD  Result Value Ref Range Status   Specimen Description  BLOOD LEFT ANTECUBITAL  Final   Special Requests   Final    BOTTLES DRAWN AEROBIC AND ANAEROBIC Blood Culture adequate volume   Culture   Final    Henry GROWTH 5 DAYS Performed at [facility], [address], [place], [place] [address]    Report Status 12/07/2019 FINAL  Final  MRSA PCR Screening     Status: None   Collection Time: 12/03/19  2:59 AM   Specimen: Nasopharyngeal  Result Value Ref Range Status   MRSA by PCR NEGATIVE NEGATIVE Final    Comment:        The GeneXpert MRSA Assay (FDA approved for NASAL specimens only), is one component of [facility] [place] comprehensive MRSA colonization surveillance program. It is not intended to diagnose MRSA infection nor to guide or monitor treatment for MRSA infections. Performed at [facility], [address], [place], [place] [address]          Radiology Studies: DG CHEST PORT 1 VIEW  Result Date: 12/09/2019 CLINICAL DATA:  Shortness of breath EXAM: PORTABLE CHEST 1 VIEW COMPARISON:  With 12/04/2019 FINDINGS: Stable mild elevation of the right hemidiaphragm. Low lung volumes. Patchy bilateral airspace opacities are again noted, slightly improved on the right since prior study. Heart is normal size. Henry visible effusions or acute bony abnormality. IMPRESSION: Very low lung volumes.  Mild elevation of the right hemidiaphragm. Patchy bilateral airspace disease, improved on the right since prior study. Electronically Signed   By: Charlett Nose M.D.   On: 12/09/2019 10:51        Scheduled Meds: . Chlorhexidine  Gluconate Cloth  6 each Topical Daily  . dexamethasone (DECADRON) injection  6 mg Intravenous Q24H  . enoxaparin (LOVENOX) injection  40 mg Subcutaneous Q12H  . feeding supplement (ENSURE ENLIVE)  237 mL Oral TID BM  . feeding supplement (GLUCERNA 1.5 CAL)  1,000 mL [name] Tube Q24H  . feeding supplement (PRO-STAT SUGAR FREE 64)  30 mL [name] Tube BID  . influenza vac split quadrivalent PF  0.5 mL Intramuscular Tomorrow-1000  . insulin aspart  0-20 Units Subcutaneous TID WC  . insulin aspart  0-5 Units Subcutaneous QHS  . insulin aspart  7 Units Subcutaneous TID WC  . insulin detemir  14 Units Subcutaneous BID  . linagliptin  5 mg Oral Daily  . mouth rinse  15 mL Mouth Rinse BID   Continuous Infusions: . sodium chloride Stopped (12/04/19 0939)     LOS: 8 days    Time spent: over 30 min.     Lacretia Nicks, MD [facility] Pager AMION  If 7PM-7AM, please contact night-coverage [email or web address] Password [email or web address] 12/10/2019, 1:57 PM

## 2019-12-10 NOTE — Plan of Care (Signed)
  Problem: Education: Goal: Knowledge of risk factors and measures for prevention of condition will improve Outcome: Progressing   Problem: Coping: Goal: Psychosocial and spiritual needs will be supported Outcome: Progressing   Problem: Respiratory: Goal: Will maintain a patent airway Outcome: Progressing Goal: Complications related to the disease process, condition or treatment will be avoided or minimized Outcome: Progressing   

## 2019-12-11 LAB — COMPREHENSIVE METABOLIC PANEL
ALT: 148 U/L — ABNORMAL HIGH (ref 0–44)
AST: 64 U/L — ABNORMAL HIGH (ref 15–41)
Albumin: 2.8 g/dL — ABNORMAL LOW (ref 3.5–5.0)
Alkaline Phosphatase: 77 U/L (ref 38–126)
Anion gap: 12 (ref 5–15)
BUN: 30 mg/dL — ABNORMAL HIGH (ref 8–23)
CO2: 25 mmol/L (ref 22–32)
Calcium: 8.2 mg/dL — ABNORMAL LOW (ref 8.9–10.3)
Chloride: 102 mmol/L (ref 98–111)
Creatinine, Ser: 0.64 mg/dL (ref 0.61–1.24)
GFR calc Af Amer: >60 mL/min (ref >60)
GFR calc non Af Amer: >60 mL/min (ref >60)
Glucose, Bld: 220 mg/dL — ABNORMAL HIGH (ref 70–99)
Potassium: 4.5 mmol/L (ref 3.5–5.1)
Sodium: 139 mmol/L (ref 135–145)
Total Bilirubin: 0.6 mg/dL (ref 0.3–1.2)
Total Protein: 5.9 g/dL — ABNORMAL LOW (ref 6.5–8.1)

## 2019-12-11 LAB — GLUCOSE, CAPILLARY
Glucose-Capillary: 200 mg/dL — ABNORMAL HIGH (ref 70–99)
Glucose-Capillary: 244 mg/dL — ABNORMAL HIGH (ref 70–99)
Glucose-Capillary: 323 mg/dL — ABNORMAL HIGH (ref 70–99)
Glucose-Capillary: 290 mg/dL — ABNORMAL HIGH (ref 70–99)

## 2019-12-11 LAB — C-REACTIVE PROTEIN: CRP: 1.4 mg/dL — ABNORMAL HIGH (ref <1.0)

## 2019-12-11 LAB — D-DIMER, QUANTITATIVE: D-Dimer, Quant: 2.62 ug/mL-FEU — ABNORMAL HIGH (ref 0.00–0.50)

## 2019-12-11 LAB — CBC
HCT: 51.9 % (ref 39.0–52.0)
Hemoglobin: 17 g/dL (ref 13.0–17.0)
MCH: 28.6 pg (ref 26.0–34.0)
MCHC: 32.8 g/dL (ref 30.0–36.0)
MCV: 87.4 fL (ref 80.0–100.0)
Platelets: 252 10*3/uL (ref 150–400)
RBC: 5.94 MIL/uL — ABNORMAL HIGH (ref 4.22–5.81)
RDW: 13.4 % (ref 11.5–15.5)
WBC: 12.8 10*3/uL — ABNORMAL HIGH (ref 4.0–10.5)
nRBC: 0.6 % — ABNORMAL HIGH (ref 0.0–0.2)

## 2019-12-11 LAB — FERRITIN: Ferritin: 305 ng/mL (ref 24–336)

## 2019-12-11 MED ORDER — INSULIN DETEMIR 100 UNIT/ML ~~LOC~~ SOLN
17.0000 [IU] | Freq: Two times a day (BID) | SUBCUTANEOUS | Status: DC
  Administered 2019-12-11 – 2019-12-19 (×13): 17 [IU] via SUBCUTANEOUS
  Filled 2019-12-11 (×18): qty 0.17

## 2019-12-11 NOTE — Plan of Care (Signed)
  Problem: Education: Goal: Knowledge of risk factors and measures for prevention of condition will improve Outcome: Progressing   Problem: Coping: Goal: Psychosocial and spiritual needs will be supported Outcome: Progressing   Problem: Respiratory: Goal: Will maintain a patent airway Outcome: Progressing Goal: Complications related to the disease process, condition or treatment will be avoided or minimized Outcome: Progressing   

## 2019-12-11 NOTE — Progress Notes (Addendum)
PROGRESS NOTE    Henry [name]  CVE:[id number] DOB: 1956-08-13 DOA: 12/11/2019 PCP: Patient, No Pcp Per   Brief Narrative:  63 yo with T2DM presenting with AHRF 2/2 COVID 19 pneumonia.   12/17 Admitted to IMTS.  Started on steroids, remdesivir.   12/18 Placed on [place] overnight and transferred to ICU 12/20 Transferred to PCU at [facility] on heated high flow 12/21 Requiring 40 L HHFNC.  Received plasma and actemra.  He's admitted with COVID 19 pneumonia, continuing to require heated high flow oxygen in the progressive unit.  Assessment & Plan:   Active Problems:   Acute respiratory failure with hypoxia (HCC)   Acute respiratory failure due to COVID-19 [facility])   Hyperglycemia   Acute hypoxemic respiratory failure (HCC)  Acute Hypoxic Respiratory Failure 2/2 COVID 19 Pneumonia:  CXR 12/19 with bilateral pneumonia with progression from previous images CTA 12/18 without evidence of PE, but notable for multifocal ground glass opacities CXR 12/24 with patchy bilateral airspace disease Remdesivir 12/17 - 12/21 Steroids 12/17 - 12/26.  Has completed 10 days. Plasma x1 12/21. Actemra x1 12/21. Continues on 30 L HFNC at 100% FiO2 with NRB I/O, daily weights (net negate 3.1 L) Encourage proning, IS, OOB Daily inflammatory labs  COVID-19 Labs  Recent Labs    12/09/19 0250 12/10/19 0830 12/11/19 1000  DDIMER 1.57* 1.66* 2.62*  FERRITIN 385* 307 305  CRP 0.9 1.1* 1.4*    No results found for: SARSCOV2NAA  Hypotension: resolved  Type 2 Diabetes: new diagnosis Levemir.  SSI.  Tradjenta.  Continue to adjust as needed. A1c 7.5  Elevated Liver Enzymes: likely 2/2 COVID infection, follow, rising, follow RUQ [place], INR.  Negative acute hepatitis panel.  Poor PO intake: he's got cortrak, tube feeds per RD, continue to encourage PO.  DVT prophylaxis: intermediate dose lovenox Code Status: full  Family Communication: none at bedside Disposition Plan: pending further improvement   Consultants:   none  Procedures:   none  Antimicrobials:  Anti-infectives (From admission, onward)   Start     Dose/Rate Route Frequency Ordered Stop   12/03/19 1000  remdesivir 100 mg in sodium chloride 0.9 % 100 mL IVPB     100 mg 200 mL/hr over 30 Minutes Intravenous Daily 11/21/2019 1312 12/06/19 1103   11/27/2019 1315  remdesivir 200 mg in sodium chloride 0.9% 250 mL IVPB     200 mg 580 mL/hr over 30 Minutes Intravenous Once 11/26/2019 1312 11/27/2019 1648     Subjective: No new complaints Feels about the same  Objective: Vitals:   12/11/19 0849 12/11/19 1201 12/11/19 1520 12/11/19 1745  BP: 104/61 102/70 115/78   Pulse: 76 87 89   Resp: (!) 28 (!) 30 (!) 23   Temp:  98.2 F (36.8 C) 97.6 F (36.4 C)   TempSrc:  Oral Oral   SpO2: 91% 94%  97%  Weight:      Height:        Intake/Output Summary (Last 24 hours) at 12/11/2019 1840 Last data filed at 12/11/2019 1306 Gross per 24 hour  Intake 1615 ml  Output 1975 ml  Net -360 ml   Filed Weights   12/04/19 0800 12/05/19 0445 12/08/19 0500  Weight: 78.5 kg 78.4 kg 65.9 kg    Examination:  General: No acute distress. Cardiovascular: RRR Lungs: CTAB, slightly increased WOB Abdomen: Soft, nontender, nondistended  Neurological: Alert and oriented 3. Moves all extremities 4. Cranial nerves II through XII grossly intact. Skin: Warm and dry. No rashes  or lesions. Extremities: No clubbing or cyanosis. No edema.   Data Reviewed: I have personally reviewed following labs and imaging studies  CBC: Recent Labs  Lab 12/06/19 0430 12/07/19 0245 12/08/19 0350 12/09/19 0250 12/10/19 0830 12/11/19 1000  WBC 7.2 7.5 7.7 8.9 12.1* 12.8*  NEUTROABS 6.4 6.8 7.1 8.3* 11.5*  --   HGB 15.5 14.3 14.9 15.6 16.4 17.0  HCT 47.5 43.2 44.8 47.0 49.9 51.9  MCV 89.0 88.3 87.3 87.2 87.9 87.4  PLT 243 255 259 260 281 252   Basic Metabolic Panel: Recent Labs  Lab 12/07/19 0245 12/08/19 0350 12/08/19 1630 12/09/19 0250  12/09/19 1630 12/10/19 0830 12/11/19 1000  NA 139 136  --  138  --  139 139  K 4.2 4.7  --  4.4  --  4.8 4.5  CL 106 107  --  107  --  105 102  CO2 24 21*  --  22  --  21* 25  GLUCOSE 210* 174*  --  228*  --  325* 220*  BUN 25* 22  --  32*  --  32* 30*  CREATININE 0.53* 0.55*  --  0.67  --  0.69 0.64  CALCIUM 7.9* 7.8*  --  8.2*  --  8.3* 8.2*  MG 2.2 2.1 2.2 2.2 2.2 2.2  --   PHOS 3.1 3.7 3.2 3.3 3.5 3.5  --    GFR: Estimated Creatinine Clearance: 83.3 mL/min (by C-G formula based on SCr of 0.64 mg/dL). Liver Function Tests: Recent Labs  Lab 12/07/19 0245 12/08/19 0350 12/09/19 0250 12/10/19 0830 12/11/19 1000  AST 36 71* 67* 51* 64*  ALT 77* 95* 118* 125* 148*  ALKPHOS 59 63 71 79 77  BILITOT 1.1 1.1 0.9 0.6 0.6  PROT 5.8* 5.8* 5.8* 6.0* 5.9*  ALBUMIN 2.7* 2.6* 2.6* 2.8* 2.8*   No results for input(s): LIPASE, AMYLASE in the last 168 hours. No results for input(s): AMMONIA in the last 168 hours. Coagulation Profile: No results for input(s): INR, PROTIME in the last 168 hours. Cardiac Enzymes: No results for input(s): CKTOTAL, CKMB, CKMBINDEX, TROPONINI in the last 168 hours. BNP (last 3 results) No results for input(s): PROBNP in the last 8760 hours. HbA1C: No results for input(s): HGBA1C in the last 72 hours. CBG: Recent Labs  Lab 12/10/19 1230 12/10/19 1648 12/11/19 0731 12/11/19 1159 12/11/19 1644  GLUCAP 194* 328* 200* 244* 323*   Lipid Profile: No results for input(s): CHOL, HDL, LDLCALC, TRIG, CHOLHDL, LDLDIRECT in the last 72 hours. Thyroid Function Tests: No results for input(s): TSH, T4TOTAL, FREET4, T3FREE, THYROIDAB in the last 72 hours. Anemia Panel: Recent Labs    12/10/19 0830 12/11/19 1000  FERRITIN 307 305   Sepsis Labs: Recent Labs  Lab 12/05/19 0345 12/05/19 2013 12/06/19 0430 12/07/19 0245  PROCALCITON 0.47 <0.10 <0.10 <0.10    Recent Results (from the past 240 hour(s))  Blood Culture (routine x 2)     Status: None    Collection Time: 02/08/2019 11:01 AM   Specimen: BLOOD  Result Value Ref Range Status   Specimen Description BLOOD RIGHT ANTECUBITAL  Final   Special Requests   Final    BOTTLES DRAWN AEROBIC AND ANAEROBIC Blood Culture adequate volume   Culture   Final    NO GROWTH 5 DAYS Performed at [facility], [address] [place], [place] [address]    Report Status 12/07/2019 FINAL  Final  Blood Culture (routine x 2)  Status: None   Collection Time: 11/24/2019 12:05 PM   Specimen: BLOOD  Result Value Ref Range Status   Specimen Description BLOOD LEFT ANTECUBITAL  Final   Special Requests   Final    BOTTLES DRAWN AEROBIC AND ANAEROBIC Blood Culture adequate volume   Culture   Final    NO GROWTH 5 DAYS Performed at [facility], [address], [place], [place] [address]    Report Status 12/07/2019 FINAL  Final  MRSA PCR Screening     Status: None   Collection Time: 12/03/19  2:59 AM   Specimen: Nasopharyngeal  Result Value Ref Range Status   MRSA by PCR NEGATIVE NEGATIVE Final    Comment:        The GeneXpert MRSA Assay (FDA approved for NASAL specimens only), is one component of a comprehensive MRSA colonization surveillance program. It is not intended to diagnose MRSA infection nor to guide or monitor treatment for MRSA infections. Performed at [facility], [address], [place], [place] [address]          Radiology Studies: No results found.      Scheduled Meds: . Chlorhexidine Gluconate Cloth  6 each Topical Daily  . enoxaparin (LOVENOX) injection  40 mg Subcutaneous Q12H  . feeding supplement (ENSURE ENLIVE)  237 mL Oral TID BM  . feeding supplement (GLUCERNA 1.5 CAL)  1,000 mL Per Tube Q24H  . feeding supplement (PRO-STAT SUGAR FREE 64)  30 mL Per Tube BID  . influenza vac split quadrivalent PF  0.5 mL Intramuscular Tomorrow-1000  . insulin aspart  0-20 Units Subcutaneous TID WC  . insulin aspart  0-5 Units Subcutaneous QHS  . insulin  aspart  7 Units Subcutaneous TID WC  . insulin detemir  14 Units Subcutaneous BID  . linagliptin  5 mg Oral Daily  . mouth rinse  15 mL Mouth Rinse BID   Continuous Infusions: . sodium chloride Stopped (12/04/19 0939)     LOS: 9 days    Time spent: over 30 min.     Lacretia Nicks, MD [facility] Pager AMION  If 7PM-7AM, please contact night-coverage [email or web address] Password [facility] 12/11/2019, 6:40 PM

## 2019-12-12 ENCOUNTER — Inpatient Hospital Stay (HOSPITAL_COMMUNITY): Payer: HRSA Program

## 2019-12-12 DIAGNOSIS — IMO0002 Reserved for concepts with insufficient information to code with codable children: Secondary | ICD-10-CM | POA: Diagnosis present

## 2019-12-12 DIAGNOSIS — E1165 Type 2 diabetes mellitus with hyperglycemia: Secondary | ICD-10-CM | POA: Diagnosis present

## 2019-12-12 DIAGNOSIS — R748 Abnormal levels of other serum enzymes: Secondary | ICD-10-CM | POA: Diagnosis present

## 2019-12-12 DIAGNOSIS — J1282 Pneumonia due to coronavirus disease 2019: Secondary | ICD-10-CM | POA: Diagnosis present

## 2019-12-12 LAB — COMPREHENSIVE METABOLIC PANEL
ALT: 130 U/L — ABNORMAL HIGH (ref 0–44)
AST: 46 U/L — ABNORMAL HIGH (ref 15–41)
Albumin: 2.6 g/dL — ABNORMAL LOW (ref 3.5–5.0)
Alkaline Phosphatase: 68 U/L (ref 38–126)
Anion gap: 9 (ref 5–15)
BUN: 31 mg/dL — ABNORMAL HIGH (ref 8–23)
CO2: 27 mmol/L (ref 22–32)
Calcium: 8.3 mg/dL — ABNORMAL LOW (ref 8.9–10.3)
Chloride: 102 mmol/L (ref 98–111)
Creatinine, Ser: 0.58 mg/dL — ABNORMAL LOW (ref 0.61–1.24)
GFR calc Af Amer: >60 mL/min (ref >60)
GFR calc non Af Amer: >60 mL/min (ref >60)
Glucose, Bld: 158 mg/dL — ABNORMAL HIGH (ref 70–99)
Potassium: 4.1 mmol/L (ref 3.5–5.1)
Sodium: 138 mmol/L (ref 135–145)
Total Bilirubin: 0.6 mg/dL (ref 0.3–1.2)
Total Protein: 5.4 g/dL — ABNORMAL LOW (ref 6.5–8.1)

## 2019-12-12 LAB — CBC WITH DIFFERENTIAL/PLATELET
Abs Immature Granulocytes: 0.28 10*3/uL — ABNORMAL HIGH (ref 0.00–0.07)
Basophils Absolute: 0 10*3/uL (ref 0.0–0.1)
Basophils Relative: 0 %
Eosinophils Absolute: 0.1 10*3/uL (ref 0.0–0.5)
Eosinophils Relative: 1 %
HCT: 48.5 % (ref 39.0–52.0)
Hemoglobin: 16 g/dL (ref 13.0–17.0)
Immature Granulocytes: 2 %
Lymphocytes Relative: 7 %
Lymphs Abs: 0.9 10*3/uL (ref 0.7–4.0)
MCH: 29.3 pg (ref 26.0–34.0)
MCHC: 33 g/dL (ref 30.0–36.0)
MCV: 88.7 fL (ref 80.0–100.0)
Monocytes Absolute: 0.4 10*3/uL (ref 0.1–1.0)
Monocytes Relative: 3 %
Neutro Abs: 12.1 10*3/uL — ABNORMAL HIGH (ref 1.7–7.7)
Neutrophils Relative %: 87 %
Platelets: 223 10*3/uL (ref 150–400)
RBC: 5.47 MIL/uL (ref 4.22–5.81)
RDW: 13.7 % (ref 11.5–15.5)
WBC: 13.8 10*3/uL — ABNORMAL HIGH (ref 4.0–10.5)
nRBC: 0.4 % — ABNORMAL HIGH (ref 0.0–0.2)

## 2019-12-12 LAB — FERRITIN: Ferritin: 244 ng/mL (ref 24–336)

## 2019-12-12 LAB — GLUCOSE, CAPILLARY
Glucose-Capillary: 163 mg/dL — ABNORMAL HIGH (ref 70–99)
Glucose-Capillary: 255 mg/dL — ABNORMAL HIGH (ref 70–99)
Glucose-Capillary: 129 mg/dL — ABNORMAL HIGH (ref 70–99)
Glucose-Capillary: 179 mg/dL — ABNORMAL HIGH (ref 70–99)
Glucose-Capillary: 138 mg/dL — ABNORMAL HIGH (ref 70–99)
Glucose-Capillary: 84 mg/dL (ref 70–99)

## 2019-12-12 LAB — PHOSPHORUS: Phosphorus: 4.2 mg/dL (ref 2.5–4.6)

## 2019-12-12 LAB — D-DIMER, QUANTITATIVE: D-Dimer, Quant: 2.7 ug/mL-FEU — ABNORMAL HIGH (ref 0.00–0.50)

## 2019-12-12 LAB — PROCALCITONIN: Procalcitonin: <0.1 ng/mL

## 2019-12-12 LAB — C-REACTIVE PROTEIN: CRP: 1 mg/dL — ABNORMAL HIGH (ref <1.0)

## 2019-12-12 LAB — MAGNESIUM: Magnesium: 2.2 mg/dL (ref 1.7–2.4)

## 2019-12-12 MED ORDER — SODIUM CHLORIDE 0.9 % IV SOLN
3.0000 g | Freq: Four times a day (QID) | INTRAVENOUS | Status: DC
  Administered 2019-12-12 – 2019-12-16 (×16): 3 g via INTRAVENOUS
  Filled 2019-12-12: qty 3
  Filled 2019-12-12 (×3): qty 8
  Filled 2019-12-12: qty 3
  Filled 2019-12-12 (×2): qty 8
  Filled 2019-12-12 (×2): qty 3
  Filled 2019-12-12: qty 8
  Filled 2019-12-12: qty 3
  Filled 2019-12-12 (×2): qty 8
  Filled 2019-12-12: qty 3
  Filled 2019-12-12 (×2): qty 8
  Filled 2019-12-12: qty 3
  Filled 2019-12-12 (×6): qty 8

## 2019-12-12 MED ORDER — FUROSEMIDE 10 MG/ML IJ SOLN
60.0000 mg | Freq: Once | INTRAMUSCULAR | Status: DC
  Filled 2019-12-12: qty 6

## 2019-12-12 NOTE — Progress Notes (Addendum)
PROGRESS NOTE    Henry [name]  ZOX:[id number] DOB: Feb 15, 1956 DOA: 11/29/2019 PCP: Patient, No Pcp Per   Brief Narrative:  63 yo Hispanic male (Spanish-speaking) PMHx diabetes type 2 uncontrolled with complication, with T2DM presenting with AHRF 2/2 COVID 19 pneumonia   Subjective: Alert, negative CP, negative abdominal pain, follows all commands.   Assessment & Plan:   Active Problems:   Acute respiratory failure with hypoxia (HCC)   COVID-19   Hyperglycemia   Acute hypoxemic respiratory failure (HCC)   Pneumonia due to COVID-19 virus   Elevated liver enzymes   Diabetes mellitus type 2, uncontrolled, with complications (HCC)  Covid pneumonia/acute respiratory failure with hypoxia COVID-19 Labs  Recent Labs    12/10/19 0830 12/11/19 1000 12/12/19 0330  DDIMER 1.66* 2.62* 2.70*  FERRITIN 307 305 244  CRP 1.1* 1.4* 1.0*    12/17 POC SARS coronavirus positive -Complete course of remdesivir.  Last dose 12/21 -Completed 10-day course of steroids.  Last dose 10/26 -12/21 transfused 1 unit Covid convalescent plasma -12/21 Actemra x1 -Strict in and out -Daily weight -12/27 Lasix 60 mg x 1 -Lasix as needed   Elevated liver enzymes Results for Henry MoorsEREZ, Henry (MRN [id number]) as of 12/12/2019 11:10  Ref. Range 12/09/2019 02:50 12/09/2019 16:30 12/10/2019 08:30 12/11/2019 10:00 12/12/2019 03:30  Alkaline Phosphatase Latest Ref Range: 38 - 126 U/L 71  79 77 68  Albumin Latest Ref Range: 3.5 - 5.0 g/dL 2.6 (L)  2.8 (L) 2.8 (L) 2.6 (L)  AST Latest Ref Range: 15 - 41 U/L 67 (H)  51 (H) 64 (H) 46 (H)  ALT Latest Ref Range: 0 - 44 U/L 118 (H)  125 (H) 148 (H) 130 (H)  -Continue to trend up.  RUQ ultrasound pending. -12/27 n.p.o. after midnight  Hypotension -Resolved  Diabetes type 2 uncontrolled with complication -12/17 hemoglobin A1c= 7.5 -Levemir 17 units BID -NovoLog 7 units TID -Resistant SSI -Tradjenta 5 mg daily while we given  Dysphagia?  -N.p.o. until cleared  by swallow evaluation -Continue tube feeds via core track -12/27 PCXR shows increased patchy opacification right lower lobe base see results below  Aspiration pneumonia -Trend procalcitonin -We will start empiric antibiotics      DVT prophylaxis: Lovenox Code Status: Full Family Communication:  Disposition Plan: TBD   Consultants:    Procedures/Significant Events:  12/17 Admitted to IMTS.  Started on steroids, remdesivir.   12/18 Placed on HHFNC overnight and transferred to ICU 12/20 [facility] 12/21 Requiring 40 L HHFNC.  Received plasma and actemra. 12/27; PCXR-Stable asymmetric elevation right hemidiaphragm.  -Slight progression of patchy airspace disease at the right base.    I have personally reviewed and interpreted all radiology studies and my findings are as above.  VENTILATOR SETTINGS: Heliox nasal cannula + NRB 12/27 Flow rate; 40 L/min FiO2; 100% SPO2 93%    Cultures 12/17 POC SARS coronavirus positive    Antimicrobials: Anti-infectives (From admission, onward)   Start     Dose/Rate Stop   12/03/19 1000  remdesivir 100 mg in sodium chloride 0.9 % 100 mL IVPB     100 mg 200 mL/hr over 30 Minutes 12/06/19 1103   12/16/2019 1315  remdesivir 200 mg in sodium chloride 0.9% 250 mL IVPB     200 mg 580 mL/hr over 30 Minutes 12/15/2019 1648       Devices    LINES / TUBES:      Continuous Infusions: . sodium chloride Stopped (  12/04/19 0939)     Objective: Vitals:   12/12/19 1135 12/12/19 1235 12/12/19 1344 12/12/19 1400  BP: 94/66 (!) 101/54  106/75  Pulse: 74   92  Resp: (!) 26   (!) 29  Temp: 97.9 F (36.6 C)     TempSrc: Oral     SpO2: 96%  93% 90%  Weight:      Height:        Intake/Output Summary (Last 24 hours) at 12/12/2019 1410 Last data filed at 12/12/2019 1214 Gross per 24 hour  Intake 720 ml  Output 500 ml  Net 220 ml   Filed Weights   12/04/19 0800 12/05/19 0445 12/08/19  0500  Weight: 78.5 kg 78.4 kg 65.9 kg    Examination:  General: A/O x4, positive acute respiratory distress Eyes: negative scleral hemorrhage, negative anisocoria, negative icterus ENT: Negative Runny nose, negative gingival bleeding, Neck:  Negative scars, masses, torticollis, lymphadenopathy, JVD Lungs: Tachypneic, decreased breath sounds diffusely, without wheezes or crackles Cardiovascular: Regular rate and rhythm without murmur gallop or rub normal S1 and S2 Abdomen: negative abdominal pain, nondistended, positive soft, bowel sounds, no rebound, no ascites, no appreciable mass Extremities: No significant cyanosis, clubbing, or edema bilateral lower extremities Skin: Negative rashes, lesions, ulcers Psychiatric:  Negative depression, negative anxiety, negative fatigue, negative mania  Central nervous system:  Cranial nerves II through XII intact, tongue/uvula midline, all extremities muscle strength 5/5, sensation intact throughout, negative dysarthria, negative expressive aphasia, negative receptive aphasia.  .     Data Reviewed: Care during the described time interval was provided by me .  I have reviewed this patient's available data, including medical history, events of note, physical examination, and all test results as part of my evaluation.   CBC: Recent Labs  Lab 12/07/19 0245 12/08/19 0350 12/09/19 0250 12/10/19 0830 12/11/19 1000 12/12/19 0330  WBC 7.5 7.7 8.9 12.1* 12.8* 13.8*  NEUTROABS 6.8 7.1 8.3* 11.5*  --  12.1*  HGB 14.3 14.9 15.6 16.4 17.0 16.0  HCT 43.2 44.8 47.0 49.9 51.9 48.5  MCV 88.3 87.3 87.2 87.9 87.4 88.7  PLT 255 259 260 281 252 829   Basic Metabolic Panel: Recent Labs  Lab 12/08/19 0350 12/08/19 1630 12/09/19 0250 12/09/19 1630 12/10/19 0830 12/11/19 1000 12/12/19 0330  NA 136  --  138  --  139 139 138  K 4.7  --  4.4  --  4.8 4.5 4.1  CL 107  --  107  --  105 102 102  CO2 21*  --  22  --  21* 25 27  GLUCOSE 174*  --  228*  --   325* 220* 158*  BUN 22  --  32*  --  32* 30* 31*  CREATININE 0.55*  --  0.67  --  0.69 0.64 0.58*  CALCIUM 7.8*  --  8.2*  --  8.3* 8.2* 8.3*  MG 2.1 2.2 2.2 2.2 2.2  --  2.2  PHOS 3.7 3.2 3.3 3.5 3.5  --  4.2   GFR: Estimated Creatinine Clearance: 83.3 mL/min (A) (by C-G formula based on SCr of 0.58 mg/dL (L)). Liver Function Tests: Recent Labs  Lab 12/08/19 0350 12/09/19 0250 12/10/19 0830 12/11/19 1000 12/12/19 0330  AST 71* 67* 51* 64* 46*  ALT 95* 118* 125* 148* 130*  ALKPHOS 63 71 79 77 68  BILITOT 1.1 0.9 0.6 0.6 0.6  PROT 5.8* 5.8* 6.0* 5.9* 5.4*  ALBUMIN 2.6* 2.6* 2.8* 2.8* 2.6*   No results  for input(s): LIPASE, AMYLASE in the last 168 hours. No results for input(s): AMMONIA in the last 168 hours. Coagulation Profile: No results for input(s): INR, PROTIME in the last 168 hours. Cardiac Enzymes: No results for input(s): CKTOTAL, CKMB, CKMBINDEX, TROPONINI in the last 168 hours. BNP (last 3 results) No results for input(s): PROBNP in the last 8760 hours. HbA1C: No results for input(s): HGBA1C in the last 72 hours. CBG: Recent Labs  Lab 12/11/19 2002 12/12/19 0434 12/12/19 0713 12/12/19 1133 12/12/19 1331  GLUCAP 290* 163* 255* 129* 179*   Lipid Profile: No results for input(s): CHOL, HDL, LDLCALC, TRIG, CHOLHDL, LDLDIRECT in the last 72 hours. Thyroid Function Tests: No results for input(s): TSH, T4TOTAL, FREET4, T3FREE, THYROIDAB in the last 72 hours. Anemia Panel: Recent Labs    12/11/19 1000 12/12/19 0330  FERRITIN 305 244   Urine analysis:    Component Value Date/Time   COLORURINE YELLOW 11/24/2019 2341   APPEARANCEUR CLEAR 11/28/2019 2341   LABSPEC 1.009 12/16/2019 2341   PHURINE 7.0 12/14/2019 2341   GLUCOSEU 50 (A) 12/10/2019 2341   HGBUR NEGATIVE 12/12/2019 2341   BILIRUBINUR NEGATIVE 12/04/2019 2341   KETONESUR 5 (A) 12/08/2019 2341   PROTEINUR NEGATIVE 12/09/2019 2341   UROBILINOGEN 1.0 12/07/2007 1023   NITRITE NEGATIVE 11/30/2019  2341   LEUKOCYTESUR NEGATIVE 11/28/2019 2341   Sepsis Labs: @LABRCNTIP (procalcitonin:4,lacticidven:4)  ) Recent Results (from the past 240 hour(s))  MRSA PCR Screening     Status: None   Collection Time: 12/03/19  2:59 AM   Specimen: Nasopharyngeal  Result Value Ref Range Status   MRSA by PCR NEGATIVE NEGATIVE Final    Comment:        The GeneXpert MRSA Assay (FDA approved for NASAL specimens only), is one component of a comprehensive MRSA colonization surveillance program. It is not intended to diagnose MRSA infection nor to guide or monitor treatment for MRSA infections. Performed at [facility], [address] [place], [place]          Radiology Studies: No results found.      Scheduled Meds: . Chlorhexidine Gluconate Cloth  6 each Topical Daily  . enoxaparin (LOVENOX) injection  40 mg Subcutaneous Q12H  . feeding supplement (ENSURE ENLIVE)  237 mL Oral TID BM  . feeding supplement (GLUCERNA 1.5 CAL)  1,000 mL Per Tube Q24H  . feeding supplement (PRO-STAT SUGAR FREE 64)  30 mL Per Tube BID  . furosemide  60 mg Intravenous Once  . influenza vac split quadrivalent PF  0.5 mL Intramuscular Tomorrow-1000  . insulin aspart  0-20 Units Subcutaneous TID WC  . insulin aspart  0-5 Units Subcutaneous QHS  . insulin aspart  7 Units Subcutaneous TID WC  . insulin detemir  17 Units Subcutaneous BID  . linagliptin  5 mg Oral Daily  . mouth rinse  15 mL Mouth Rinse BID   Continuous Infusions: . sodium chloride Stopped (12/04/19 0939)     LOS: 10 days   The patient is critically ill with multiple organ systems failure and requires high complexity decision making for assessment and support, frequent evaluation and titration of therapies, application of advanced monitoring technologies and extensive interpretation of multiple databases. Critical Care Time devoted to patient care services described in this note  Time spent: 40 minutes     Carlos Heber,  12/06/19, MD [facility] Pager [phone]  If 7PM-7AM, please contact night-coverage [email or web address] Password [email or web address] 12/12/2019, 2:10 PM

## 2019-12-12 NOTE — Progress Notes (Signed)
interpreter [phone] used to tell pt he is NPO and not to eat or drink for an ultrasound that is ordered.  Diet is to resume when ultrasound is complete. Pt agreed and had no additional questions.

## 2019-12-12 NOTE — Progress Notes (Signed)
Around 1300 pt oxygen saturation dropped to 74% on HHFNC 30L/100%.  Pt placed on 40L/100% and a NRB and after a few minutes oxygen saturation came to mid 80s-89%.  Pt was found to be coughing after his lunch tray was delivered.  MD was notified, stat chest X-ray ordered, procal ordered, Pt placed NPO, speech eval ordered.  Speech recommended a dysphagia diet and no straws.  Diet order was placed by speech.  Verified MD would like for pt to stay NPO for the night and to rest and will resume diet tomorrow after [place].  Pt titrated to 35L/100% at 1740 with oxygen saturation 93-96%.

## 2019-12-12 NOTE — Progress Notes (Signed)
Pharmacy Antibiotic Note  Henry [name] is a 63 y.o. male admitted on 11/26/2019 with pneumonia.  Pharmacy has been consulted for Unasyn dosing.  Pt has been here for COVID. Unasyn is being ordered for asp PNA.  Normal renal function. Blood cultures are neg.   Plan: Unasyn 3g IV q6 F/u LOT  Height: 5\' 5"  (165.1 cm) Weight: 145 lb 4.8 oz (65.9 kg) IBW/kg (Calculated) : 61.5  Temp (24hrs), Avg:98.2 F (36.8 C), Min:97.9 F (36.6 C), Max:98.5 F (36.9 C)  Recent Labs  Lab 12/08/19 0350 12/09/19 0250 12/10/19 0830 12/11/19 1000 12/12/19 0330  WBC 7.7 8.9 12.1* 12.8* 13.8*  CREATININE 0.55* 0.67 0.69 0.64 0.58*    Estimated Creatinine Clearance: 83.3 mL/min (A) (by C-G formula based on SCr of 0.58 mg/dL (L)).    Not on File  Antimicrobials this admission: 12/27 Unasyn>>  Dose adjustments this admission:   Microbiology results: 12/17 blood>>neg  Onnie Boer, PharmD, BCIDP, AAHIVP, CPP Infectious Disease Pharmacist 12/12/2019 4:31 PM

## 2019-12-12 NOTE — Plan of Care (Signed)
  Problem: Education: Goal: Knowledge of risk factors and measures for prevention of condition will improve Outcome: Progressing   Problem: Coping: Goal: Psychosocial and spiritual needs will be supported Outcome: Progressing   Problem: Respiratory: Goal: Will maintain a patent airway Outcome: Progressing Goal: Complications related to the disease process, condition or treatment will be avoided or minimized Outcome: Progressing   

## 2019-12-12 NOTE — Evaluation (Signed)
Clinical/Bedside Swallow Evaluation Patient Details  Name: Henry [name] MRN: [id number] Date of Birth: 1956/02/17  Today's Date: 12/12/2019 Time: SLP Start Time (ACUTE ONLY): 1655 SLP Stop Time (ACUTE ONLY): 1712 SLP Time Calculation (min) (ACUTE ONLY): 17 min  Past Medical History: History reviewed. No pertinent past medical history. Past Surgical History: History reviewed. No pertinent surgical history. 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 Decreased po. Covid . RN reported pt coughing during lunch. CXR Slight progression of patchy airspace disease at the right base.   Assessment / Plan / Recommendation Clinical Impression  Pt encountered alert, communicative, in chair with SpO2 around low to upper 30's on HeliOx 40L. He exhibits a respiratory based dysphagia with tachypnea at baseline and immediate cough after initial cup sip thin. He regulated rate and volume adequately and independently. Smaller sips of thin with short rest breaks if needed were subjectively consumed without overt s/s aspiration. Several delayed throat clears during assessment and pt had baseline cough/throat clear as well. Recommend Dys 3 texture for energy conservation, no straws, pills with puree and rest breaks. ST will follow and if concern for aspiration would recommend MBS at that point.      SLP Visit Diagnosis: Dysphagia, unspecified (R13.10)    Aspiration Risk  Mild aspiration risk    Diet Recommendation Dysphagia 3 (Mech soft);Thin liquid   Liquid Administration via: No straw;Cup Medication Administration: Whole meds with puree Supervision: Patient able to self feed;Intermittent supervision to cue for compensatory strategies Compensations: Slow rate;Small sips/bites Postural Changes: Seated upright at 90 degrees    Other  Recommendations Oral Care Recommendations: Oral care BID   Follow up Recommendations None       Frequency and Duration min 1 x/week  2 weeks       Prognosis Prognosis for Safe Diet Advancement: Good      Swallow Study   General HPI: 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 Decreased po. Covid . RN reported pt coughing during lunch. CXR Slight progression of patchy airspace disease at the right base. Type of Study: Bedside Swallow Evaluation Previous Swallow Assessment: (none) Diet Prior to this Study: Other (Comment)(reg this am, NPO for ultrasound) Temperature Spikes Noted: No Respiratory Status: Other (comment)(HeliOx nasal canula, 40L) History of Recent Intubation: No Behavior/Cognition: Alert;Cooperative;Pleasant mood Oral Cavity Assessment: Within Functional Limits Oral Care Completed by SLP: No Oral Cavity - Dentition: Missing dentition Vision: Functional for self-feeding Self-Feeding Abilities: Able to feed self Patient Positioning: Upright in chair Baseline Vocal Quality: Normal Volitional Cough: Strong Volitional Swallow: Able to elicit    Oral/Motor/Sensory Function Overall Oral Motor/Sensory Function: Within functional limits   Ice Chips Ice chips: Not tested   Thin Liquid Thin Liquid: Impaired Presentation: Cup Pharyngeal  Phase Impairments: Cough - Immediate;Cough - Delayed    Nectar Thick Nectar Thick Liquid: Not tested   Honey Thick Honey Thick Liquid: Not tested   Puree Puree: Within functional limits   Solid     Solid: Impaired Oral Phase Functional Implications: Prolonged oral transit      Henry [name] 12/12/2019,5:31 PM   Henry [name] [place].Ed Risk analyst [phone] Office [phone]

## 2019-12-13 ENCOUNTER — Inpatient Hospital Stay (HOSPITAL_COMMUNITY): Payer: HRSA Program

## 2019-12-13 LAB — GLUCOSE, CAPILLARY
Glucose-Capillary: 70 mg/dL (ref 70–99)
Glucose-Capillary: 66 mg/dL — ABNORMAL LOW (ref 70–99)
Glucose-Capillary: 238 mg/dL — ABNORMAL HIGH (ref 70–99)
Glucose-Capillary: 76 mg/dL (ref 70–99)
Glucose-Capillary: 84 mg/dL (ref 70–99)
Glucose-Capillary: 69 mg/dL — ABNORMAL LOW (ref 70–99)
Glucose-Capillary: 82 mg/dL (ref 70–99)
Glucose-Capillary: 79 mg/dL (ref 70–99)
Glucose-Capillary: 100 mg/dL — ABNORMAL HIGH (ref 70–99)
Glucose-Capillary: 217 mg/dL — ABNORMAL HIGH (ref 70–99)
Glucose-Capillary: 373 mg/dL — ABNORMAL HIGH (ref 70–99)
Glucose-Capillary: 158 mg/dL — ABNORMAL HIGH (ref 70–99)

## 2019-12-13 LAB — CBC WITH DIFFERENTIAL/PLATELET
Abs Immature Granulocytes: 0.18 K/uL — ABNORMAL HIGH (ref 0.00–0.07)
Basophils Absolute: 0 K/uL (ref 0.0–0.1)
Basophils Relative: 0 %
Eosinophils Absolute: 0.2 K/uL (ref 0.0–0.5)
Eosinophils Relative: 2 %
HCT: 49.4 % (ref 39.0–52.0)
Hemoglobin: 15.9 g/dL (ref 13.0–17.0)
Immature Granulocytes: 2 %
Lymphocytes Relative: 10 %
Lymphs Abs: 1.1 K/uL (ref 0.7–4.0)
MCH: 28.9 pg (ref 26.0–34.0)
MCHC: 32.2 g/dL (ref 30.0–36.0)
MCV: 89.7 fL (ref 80.0–100.0)
Monocytes Absolute: 0.3 K/uL (ref 0.1–1.0)
Monocytes Relative: 3 %
Neutro Abs: 9.1 K/uL — ABNORMAL HIGH (ref 1.7–7.7)
Neutrophils Relative %: 83 %
Platelets: 190 K/uL (ref 150–400)
RBC: 5.51 MIL/uL (ref 4.22–5.81)
RDW: 14.2 % (ref 11.5–15.5)
WBC: 11 K/uL — ABNORMAL HIGH (ref 4.0–10.5)
nRBC: 0.2 % (ref 0.0–0.2)

## 2019-12-13 LAB — COMPREHENSIVE METABOLIC PANEL WITH GFR
ALT: 118 U/L — ABNORMAL HIGH (ref 0–44)
AST: 48 U/L — ABNORMAL HIGH (ref 15–41)
Albumin: 2.5 g/dL — ABNORMAL LOW (ref 3.5–5.0)
Alkaline Phosphatase: 68 U/L (ref 38–126)
Anion gap: 10 (ref 5–15)
BUN: 30 mg/dL — ABNORMAL HIGH (ref 8–23)
CO2: 28 mmol/L (ref 22–32)
Calcium: 7.7 mg/dL — ABNORMAL LOW (ref 8.9–10.3)
Chloride: 104 mmol/L (ref 98–111)
Creatinine, Ser: 0.59 mg/dL — ABNORMAL LOW (ref 0.61–1.24)
GFR calc Af Amer: >60 mL/min
GFR calc non Af Amer: >60 mL/min
Glucose, Bld: 98 mg/dL (ref 70–99)
Potassium: 4.1 mmol/L (ref 3.5–5.1)
Sodium: 142 mmol/L (ref 135–145)
Total Bilirubin: 1 mg/dL (ref 0.3–1.2)
Total Protein: 5.2 g/dL — ABNORMAL LOW (ref 6.5–8.1)

## 2019-12-13 LAB — FERRITIN: Ferritin: 249 ng/mL (ref 24–336)

## 2019-12-13 LAB — D-DIMER, QUANTITATIVE: D-Dimer, Quant: 3.4 ug/mL-FEU — ABNORMAL HIGH (ref 0.00–0.50)

## 2019-12-13 LAB — PROCALCITONIN: Procalcitonin: <0.1 ng/mL

## 2019-12-13 LAB — MAGNESIUM: Magnesium: 2.3 mg/dL (ref 1.7–2.4)

## 2019-12-13 LAB — PHOSPHORUS: Phosphorus: 5.4 mg/dL — ABNORMAL HIGH (ref 2.5–4.6)

## 2019-12-13 LAB — C-REACTIVE PROTEIN: CRP: <0.5 mg/dL (ref <1.0)

## 2019-12-13 MED ORDER — DEXTROSE 50 % IV SOLN
12.5000 g | INTRAVENOUS | Status: AC
  Administered 2019-12-13: 12.5 g via INTRAVENOUS
  Filled 2019-12-13: qty 50

## 2019-12-13 MED ORDER — DEXTROSE 5 % IV SOLN: INTRAVENOUS | Status: DC

## 2019-12-13 NOTE — Progress Notes (Signed)
[place],MD notified of pt CBG of 69, new orders placed. CBG recheck was 82.

## 2019-12-13 NOTE — Progress Notes (Signed)
SLP Cancellation Note  Patient Details Name: Henry [name] MRN: [id number] DOB: [birth year]   Cancelled treatment:        Pt NPO until ultrasound and will start recommended diet after per this SLP's recommendations after BSE. No straws, small sips and meds in puree. Plan to check on pt tomorrow.    Houston Siren 12/13/2019, 8:29 AM

## 2019-12-13 NOTE — Progress Notes (Signed)
PROGRESS NOTE    Henry [name]  ENI:[id number] DOB: 09/21/1956 DOA: 26-Dec-2019 PCP: Patient, No Pcp Per   Brief Narrative:  63 yo Hispanic male (Spanish-speaking) PMHx diabetes type 2 uncontrolled with complication, with T2DM presenting with AHRF 2/2 COVID 19 pneumonia   Subjective: 12/28 last 24 hours afebrile, negative CP, negative abdominal pain   Assessment & Plan:   Active Problems:   Acute respiratory failure with hypoxia (HCC)   COVID-19   Hyperglycemia   Acute hypoxemic respiratory failure (HCC)   Pneumonia due to COVID-19 virus   Elevated liver enzymes   Diabetes mellitus type 2, uncontrolled, with complications (HCC)  Covid pneumonia/acute respiratory failure with hypoxia COVID-19 Labs  Recent Labs    12/11/19 1000 12/12/19 0330 12/13/19 0448  DDIMER 2.62* 2.70* 3.40*  FERRITIN 305 244 249  CRP 1.4* 1.0* <0.5    12/17 POC SARS coronavirus positive -Complete course of Remdesivir.  Last dose 12/21 -Completed 10-day course of steroids.  Last dose 10/26 -12/21 transfused 1 unit Covid convalescent plasma -12/21 Actemra x1 -Strict in and out -2.7 L -Daily weight Filed Weights   12/04/19 0800 12/05/19 0445 12/08/19 0500  Weight: 78.5 kg 78.4 kg 65.9 kg  -12/27 Lasix 60 mg x 1 -Lasix as needed   Elevated liver enzymes Results for Henry, [name] (MRN [id number]) as of 12/12/2019 11:10  Ref. Range 12/09/2019 02:50 12/09/2019 16:30 12/10/2019 08:30 12/11/2019 10:00 12/12/2019 03:30  Alkaline Phosphatase Latest Ref Range: 38 - 126 U/L 71  79 77 68  Albumin Latest Ref Range: 3.5 - 5.0 g/dL 2.6 (L)  2.8 (L) 2.8 (L) 2.6 (L)  AST Latest Ref Range: 15 - 41 U/L 67 (H)  51 (H) 64 (H) 46 (H)  ALT Latest Ref Range: 0 - 44 U/L 118 (H)  125 (H) 148 (H) 130 (H)  -Continue to trend up.  RUQ ultrasound pending. -12/27 n.p.o. after midnight  Hypotension -Resolved  Diabetes type 2 uncontrolled with complication -12/17 hemoglobin A1c= 7.5 -Levemir 17 units BID -NovoLog 7  units TID -Resistant SSI -Tradjenta 5 mg daily while we given  Hypoglycemia -D5W 25ml/hr continue until patient able to resume meals after abdominal ultrasound  Dysphagia?  -N.p.o. until cleared by swallow evaluation -Continue tube feeds via core track -12/27 PCXR shows increased patchy opacification right lower lobe base see results below -12/28 speech plans on evaluating patient on 12/29 for dysphagia.  Aspiration pneumonia -Trend procalcitonin -We will start empiric antibiotics Results for Henry, [name] (MRN [id number]) as of 12/13/2019 12:08  Ref. Range 12/12/2019 16:50 12/13/2019 04:48  Procalcitonin Latest Units: ng/mL <0.10 <0.10  -Complete 5-day course antibiotics        DVT prophylaxis: Lovenox Code Status: Full Family Communication:  Disposition Plan: TBD   Consultants:    Procedures/Significant Events:  12/17 Admitted to IMTS.  Started on steroids, remdesivir.   12/18 Placed on HHFNC overnight and transferred to ICU 12/20 Transferred to PCU at [facility] on heated high flow 12/21 Requiring 40 L HHFNC.  Received plasma and actemra. 12/27; PCXR-Stable asymmetric elevation right hemidiaphragm.  -Slight progression of patchy airspace disease at the right base.    I have personally reviewed and interpreted all radiology studies and my findings are as above.  VENTILATOR SETTINGS: HFNC 12/28 Flow rate; 25 L/min FiO2; 100% SPO2 91%    Cultures 12/17 POC SARS coronavirus positive    Antimicrobials: Anti-infectives (From admission, onward)   Start     Dose/Rate Stop   12/03/19 1000  remdesivir 100 mg  in sodium chloride 0.9 % 100 mL IVPB     100 mg 200 mL/hr over 30 Minutes 12/06/19 1103   06-14-19 1315  remdesivir 200 mg in sodium chloride 0.9% 250 mL IVPB     200 mg 580 mL/hr over 30 Minutes 06-14-19 1648       Devices    LINES / TUBES:      Continuous Infusions: . sodium chloride Stopped (12/04/19 0939)  . ampicillin-sulbactam  (UNASYN) IV 3 g (12/13/19 0537)     Objective: Vitals:   12/13/19 0400 12/13/19 0453 12/13/19 0514 12/13/19 0745  BP:  92/63  96/64  Pulse: 66 69 70 70  Resp: (!) 37 (!) 22 19 (!) 29  Temp:  98.5 F (36.9 C)  98.2 F (36.8 C)  TempSrc:  Oral  Oral  SpO2: 96% 92% 94% 91%  Weight:      Height:        Intake/Output Summary (Last 24 hours) at 12/13/2019 0936 Last data filed at 12/13/2019 0541 Gross per 24 hour  Intake 436.78 ml  Output 500 ml  Net -63.22 ml   Filed Weights   12/04/19 0800 12/05/19 0445 12/08/19 0500  Weight: 78.5 kg 78.4 kg 65.9 kg   Physical Exam:  General: A/O x4, negative acute respiratory distress Eyes: negative scleral hemorrhage, negative anisocoria, negative icterus ENT: Negative Runny nose, negative gingival bleeding, Neck:  Negative scars, masses, torticollis, lymphadenopathy, JVD Lungs: Tachypneic, clear to auscultation bilaterally without wheezes or crackles Cardiovascular: Regular rate and rhythm without murmur gallop or rub normal S1 and S2 Abdomen: negative abdominal pain, nondistended, positive soft, bowel sounds, no rebound, no ascites, no appreciable mass Extremities: No significant cyanosis, clubbing, or edema bilateral lower extremities Skin: Negative rashes, lesions, ulcers Psychiatric:  Negative depression, negative anxiety, negative fatigue, negative mania  Central nervous system:  Cranial nerves II through XII intact, tongue/uvula midline, all extremities muscle strength 5/5, sensation intact throughout, negative dysarthria, negative expressive aphasia, negative receptive aphasia.  .     Data Reviewed: Care during the described time interval was provided by me .  I have reviewed this patient's available data, including medical history, events of note, physical examination, and all test results as part of my evaluation.   CBC: Recent Labs  Lab 12/08/19 0350 12/09/19 0250 12/10/19 0830 12/11/19 1000 12/12/19 0330  12/13/19 0448  WBC 7.7 8.9 12.1* 12.8* 13.8* 11.0*  NEUTROABS 7.1 8.3* 11.5*  --  12.1* 9.1*  HGB 14.9 15.6 16.4 17.0 16.0 15.9  HCT 44.8 47.0 49.9 51.9 48.5 49.4  MCV 87.3 87.2 87.9 87.4 88.7 89.7  PLT 259 260 281 252 223 190   Basic Metabolic Panel: Recent Labs  Lab 12/09/19 0250 12/09/19 1630 12/10/19 0830 12/11/19 1000 12/12/19 0330 12/13/19 0448  NA 138  --  139 139 138 142  K 4.4  --  4.8 4.5 4.1 4.1  CL 107  --  105 102 102 104  CO2 22  --  21* 25 27 28   GLUCOSE 228*  --  325* 220* 158* 98  BUN 32*  --  32* 30* 31* 30*  CREATININE 0.67  --  0.69 0.64 0.58* 0.59*  CALCIUM 8.2*  --  8.3* 8.2* 8.3* 7.7*  MG 2.2 2.2 2.2  --  2.2 2.3  PHOS 3.3 3.5 3.5  --  4.2 5.4*   GFR: Estimated Creatinine Clearance: 83.3 mL/min (A) (by C-G formula based on SCr of 0.59 mg/dL (L)). Liver Function Tests: Recent Labs  Lab 12/09/19 0250 12/10/19 0830 12/11/19 1000 12/12/19 0330 12/13/19 0448  AST 67* 51* 64* 46* 48*  ALT 118* 125* 148* 130* 118*  ALKPHOS 71 79 77 68 68  BILITOT 0.9 0.6 0.6 0.6 1.0  PROT 5.8* 6.0* 5.9* 5.4* 5.2*  ALBUMIN 2.6* 2.8* 2.8* 2.6* 2.5*   No results for input(s): LIPASE, AMYLASE in the last 168 hours. No results for input(s): AMMONIA in the last 168 hours. Coagulation Profile: No results for input(s): INR, PROTIME in the last 168 hours. Cardiac Enzymes: No results for input(s): CKTOTAL, CKMB, CKMBINDEX, TROPONINI in the last 168 hours. BNP (last 3 results) No results for input(s): PROBNP in the last 8760 hours. HbA1C: No results for input(s): HGBA1C in the last 72 hours. CBG: Recent Labs  Lab 12/12/19 2015 12/13/19 0043 12/13/19 0258 12/13/19 0324 12/13/19 0743  GLUCAP 84 70 66* 238* 76   Lipid Profile: No results for input(s): CHOL, HDL, LDLCALC, TRIG, CHOLHDL, LDLDIRECT in the last 72 hours. Thyroid Function Tests: No results for input(s): TSH, T4TOTAL, FREET4, T3FREE, THYROIDAB in the last 72 hours. Anemia Panel: Recent Labs     12/12/19 0330 12/13/19 0448  FERRITIN 244 249   Urine analysis:    Component Value Date/Time   COLORURINE YELLOW 23-Dec-2019 [place] 12-23-2019 2341   LABSPEC 1.009 12-23-2019 2341   PHURINE 7.0 12/23/2019 2341   GLUCOSEU 50 (A) December 23, 2019 2341   HGBUR NEGATIVE 12/23/2019 2341   BILIRUBINUR NEGATIVE Dec 23, 2019 2341   KETONESUR 5 (A) 12-23-2019 2341   PROTEINUR NEGATIVE 23-Dec-2019 2341   UROBILINOGEN 1.0 12/07/2007 1023   NITRITE NEGATIVE Dec 23, 2019 2341   LEUKOCYTESUR NEGATIVE December 23, 2019 2341   Sepsis Labs: @LABRCNTIP (procalcitonin:4,lacticidven:4)  ) No results found for this or any previous visit (from the past 240 hour(s)).       Radiology Studies: DG CHEST PORT 1 VIEW  Result Date: 12/12/2019 CLINICAL DATA:  Aspiration. EXAM: PORTABLE CHEST 1 VIEW COMPARISON:  12/09/2019 FINDINGS: Cardiopericardial silhouette is at upper limits of normal for size. Stable asymmetric elevation right hemidiaphragm. Persistent interstitial and patchy airspace opacity in the mid and lower lungs bilaterally, but disease in the right base is slightly progressed. No substantial pleural effusion. A feeding tube passes into the stomach although the distal tip position is not included on the film. IMPRESSION: Slight progression of patchy airspace disease at the right base. Otherwise no substantial interval change. Electronically Signed   By: Misty Stanley M.D.   On: 12/12/2019 14:19        Scheduled Meds: . Chlorhexidine Gluconate Cloth  6 each Topical Daily  . enoxaparin (LOVENOX) injection  40 mg Subcutaneous Q12H  . feeding supplement (ENSURE ENLIVE)  237 mL Oral TID BM  . feeding supplement (GLUCERNA 1.5 CAL)  1,000 mL Per Tube Q24H  . feeding supplement (PRO-STAT SUGAR FREE 64)  30 mL Per Tube BID  . furosemide  60 mg Intravenous Once  . influenza vac split quadrivalent PF  0.5 mL Intramuscular Tomorrow-1000  . insulin aspart  0-20 Units Subcutaneous TID WC  . insulin  aspart  0-5 Units Subcutaneous QHS  . insulin aspart  7 Units Subcutaneous TID WC  . insulin detemir  17 Units Subcutaneous BID  . linagliptin  5 mg Oral Daily  . mouth rinse  15 mL Mouth Rinse BID   Continuous Infusions: . sodium chloride Stopped (12/04/19 0939)  . ampicillin-sulbactam (UNASYN) IV 3 g (12/13/19 0537)     LOS: 11 days   The  patient is critically ill with multiple organ systems failure and requires high complexity decision making for assessment and support, frequent evaluation and titration of therapies, application of advanced monitoring technologies and extensive interpretation of multiple databases. Critical Care Time devoted to patient care services described in this note  Time spent: 40 minutes     Zylen Wenig, Roselind Messier, MD [facility] Pager [phone]  If 7PM-7AM, please contact night-coverage [email or web address] Password [facility] 12/13/2019, 9:36 AM

## 2019-12-13 NOTE — Plan of Care (Signed)
  Problem: Education: Goal: Knowledge of risk factors and measures for prevention of condition will improve Outcome: Progressing   Problem: Coping: Goal: Psychosocial and spiritual needs will be supported Outcome: Progressing   Problem: Respiratory: Goal: Will maintain a patent airway Outcome: Progressing Goal: Complications related to the disease process, condition or treatment will be avoided or minimized Outcome: Progressing   

## 2019-12-14 LAB — FERRITIN: Ferritin: 258 ng/mL (ref 24–336)

## 2019-12-14 LAB — CBC WITH DIFFERENTIAL/PLATELET
Abs Immature Granulocytes: 0.11 10*3/uL — ABNORMAL HIGH (ref 0.00–0.07)
Basophils Absolute: 0 10*3/uL (ref 0.0–0.1)
Basophils Relative: 0 %
Eosinophils Absolute: 0.2 10*3/uL (ref 0.0–0.5)
Eosinophils Relative: 2 %
HCT: 48.9 % (ref 39.0–52.0)
Hemoglobin: 16 g/dL (ref 13.0–17.0)
Immature Granulocytes: 1 %
Lymphocytes Relative: 8 %
Lymphs Abs: 0.7 10*3/uL (ref 0.7–4.0)
MCH: 29.4 pg (ref 26.0–34.0)
MCHC: 32.7 g/dL (ref 30.0–36.0)
MCV: 89.9 fL (ref 80.0–100.0)
Monocytes Absolute: 0.2 10*3/uL (ref 0.1–1.0)
Monocytes Relative: 3 %
Neutro Abs: 7.7 10*3/uL (ref 1.7–7.7)
Neutrophils Relative %: 86 %
Platelets: 182 10*3/uL (ref 150–400)
RBC: 5.44 MIL/uL (ref 4.22–5.81)
RDW: 14.9 % (ref 11.5–15.5)
WBC: 9 10*3/uL (ref 4.0–10.5)
nRBC: 0.3 % — ABNORMAL HIGH (ref 0.0–0.2)

## 2019-12-14 LAB — COMPREHENSIVE METABOLIC PANEL
ALT: 120 U/L — ABNORMAL HIGH (ref 0–44)
AST: 55 U/L — ABNORMAL HIGH (ref 15–41)
Albumin: 2.6 g/dL — ABNORMAL LOW (ref 3.5–5.0)
Alkaline Phosphatase: 76 U/L (ref 38–126)
Anion gap: 8 (ref 5–15)
BUN: 31 mg/dL — ABNORMAL HIGH (ref 8–23)
CO2: 26 mmol/L (ref 22–32)
Calcium: 8.1 mg/dL — ABNORMAL LOW (ref 8.9–10.3)
Chloride: 109 mmol/L (ref 98–111)
Creatinine, Ser: 0.57 mg/dL — ABNORMAL LOW (ref 0.61–1.24)
GFR calc Af Amer: >60 mL/min (ref >60)
GFR calc non Af Amer: >60 mL/min (ref >60)
Glucose, Bld: 174 mg/dL — ABNORMAL HIGH (ref 70–99)
Potassium: 4.2 mmol/L (ref 3.5–5.1)
Sodium: 143 mmol/L (ref 135–145)
Total Bilirubin: 0.9 mg/dL (ref 0.3–1.2)
Total Protein: 5.1 g/dL — ABNORMAL LOW (ref 6.5–8.1)

## 2019-12-14 LAB — D-DIMER, QUANTITATIVE: D-Dimer, Quant: 3.78 ug/mL-FEU — ABNORMAL HIGH (ref 0.00–0.50)

## 2019-12-14 LAB — MAGNESIUM: Magnesium: 2.3 mg/dL (ref 1.7–2.4)

## 2019-12-14 LAB — GLUCOSE, CAPILLARY
Glucose-Capillary: 117 mg/dL — ABNORMAL HIGH (ref 70–99)
Glucose-Capillary: 124 mg/dL — ABNORMAL HIGH (ref 70–99)
Glucose-Capillary: 125 mg/dL — ABNORMAL HIGH (ref 70–99)
Glucose-Capillary: 129 mg/dL — ABNORMAL HIGH (ref 70–99)
Glucose-Capillary: 180 mg/dL — ABNORMAL HIGH (ref 70–99)
Glucose-Capillary: 183 mg/dL — ABNORMAL HIGH (ref 70–99)
Glucose-Capillary: 200 mg/dL — ABNORMAL HIGH (ref 70–99)

## 2019-12-14 LAB — C-REACTIVE PROTEIN: CRP: 0.7 mg/dL (ref <1.0)

## 2019-12-14 LAB — PHOSPHORUS: Phosphorus: 4.2 mg/dL (ref 2.5–4.6)

## 2019-12-14 LAB — PROCALCITONIN: Procalcitonin: <0.1 ng/mL

## 2019-12-14 MED ORDER — GLUCERNA 1.5 CAL PO LIQD
1000.0000 mL | ORAL | Status: DC
  Administered 2019-12-14 – 2019-12-19 (×4): 1000 mL
  Filled 2019-12-14 (×9): qty 1000

## 2019-12-14 MED ORDER — LIP MEDEX EX OINT
TOPICAL_OINTMENT | CUTANEOUS | Status: DC | PRN
  Filled 2019-12-14 (×2): qty 7

## 2019-12-14 NOTE — Progress Notes (Signed)
 Nutrition Follow-up  DOCUMENTATION CODES:   Not applicable  INTERVENTION:   Tube Feeding:  Transition to 24 hour feedings until able to safely take PO consistently; TF now meets 100% estimated needs Glucerna 1.5 at 65 ml/hr x 24 hours Provides 129 g of protein, 2340 kcals and 1186 mL of free water  Monitor diet tolerance   NUTRITION DIAGNOSIS:   Inadequate oral intake related to inability to eat as evidenced by NPO status.  Being addressed via TF, supplements  GOAL:   Patient will meet greater than or equal to 90% of their needs  Progressing  MONITOR:   Diet advancement, Labs, Weight trends  REASON FOR ASSESSMENT:   Malnutrition Screening Tool    ASSESSMENT:   63 year old male who presented to the ED on 12/17 with SOB and abdominal pain. No known PMH. Pt tested positive for COVID-19.  RD working remotely.  Pt on 25 L HFNC with non-rebreather  Noted pt with some difficulty swallowing, being evaluated by SLP. Pt with high aspiration risk due to respiratory status. Plan for pt to remain on diet currently but not to be fed unless respiratory status allows.   Pt has been receiving nocturnal feedings x 12 hours Glucerna 1.5 at 70 ml/hr  Pt very decompensated; noted pt stood and pivoted out of bed to chair with RN. Pt experienced significant increased SOB, RR 45-50, Sats down to 69% with slow recovery  No skin breakdown per nursing assessment  Labs: CBGs 100-200 Meds: ss novolog, novolog with meals, levemir BID, tradjenta   Diet Order:   Diet Order            DIET DYS 3 Room service appropriate? Yes; Fluid consistency: Thin  Diet effective now              EDUCATION NEEDS:   No education needs have been identified at this time  Skin:  Skin Assessment: Reviewed RN Assessment  Last BM:  12/26  Height:   Ht Readings from Last 1 Encounters:  12/04/19 5\' 5"  (1.651 m)    Weight:   Wt Readings from Last 1 Encounters:  12/14/19 66 kg    Ideal  Body Weight:  (unable to calculate due to no height in chart)  BMI:  Body mass index is 24.21 kg/m.  Estimated Nutritional Needs:   Kcal:  2200-2400  Protein:  110-130 g  Fluid:  >/= 2.0 L    Larrisa Cravey MS, RDN, LDN, CNSC [phone] Pager  [phone] Weekend/On-Call Pager

## 2019-12-14 NOTE — Progress Notes (Addendum)
7564: Assumed care of Pt from night RN. Interpreter used to explain care to Pt. Pt worried he has not been able to eat for a few days and has to be NPO until ultrasound, says he can hardly stand. RN explained the need for the [place] and need to be NPO. Informed that he had been getting tube feeding and that he is getting some IVF until the test. Pt C/o his ears hurting from oxygen, no wounds found, so RN tied O2 straps up off Pt ears. Denies pain. SpO2 94% on 25 L HHFNC and NRB. Tachypneic 34, but not in distress and denies SOB. NSR and BP stable. Does not want to get to chair at this time. Denies other needs right now, instructed to call for assistance, call bell left within reach.  3329: [place] completed. SLP at bedside. Pt instructed to eat slowly, small bites between taking NRB on/off in order to maintain sats. RR stayed between 28- mid 23s.   1100: Pt stood and pivoted out of bed to chair. Significant increased SOB, RR 45-50, Sats down to 69%. Pt slow to recover. Call bell within reach

## 2019-12-14 NOTE — Progress Notes (Addendum)
PROGRESS NOTE    Henry [name]  NOI:[id number] DOB: [birth year] DOA: 11/29/2019 PCP: Patient, No Pcp Per   Brief Narrative:  63 yo Hispanic male (Spanish-speaking) PMHx diabetes type 2 uncontrolled with complication, with T2DM presenting with AHRF 2/2 COVID 19 pneumonia   Subjective: 12/29 last 24 hours afebrile negative CP, negative abdominal pain positive S OB   Assessment & Plan:   Active Problems:   Acute respiratory failure with hypoxia (HCC)   COVID-19   Hyperglycemia   Acute hypoxemic respiratory failure (HCC)   Pneumonia due to COVID-19 virus   Elevated liver enzymes   Diabetes mellitus type 2, uncontrolled, with complications (HCC)  Covid pneumonia/acute respiratory failure with hypoxia COVID-19 Labs  Recent Labs    12/12/19 0330 12/13/19 0448 12/14/19 0245  DDIMER 2.70* 3.40* 3.78*  FERRITIN 244 249 258  CRP 1.0* <0.5 0.7    12/17 POC SARS coronavirus positive -Complete course of Remdesivir.  Last dose 12/21 -Completed 10-day course of steroids.  Last dose 10/26 -12/21 transfused 1 unit Covid convalescent plasma -12/21 Actemra x1 -Strict in and out -3.1 L -Daily weight Filed Weights   12/05/19 0445 12/08/19 0500 12/14/19 0508  Weight: 78.4 kg 65.9 kg 66 kg  -12/27 Lasix 60 mg x 1 -Lasix as needed -12/29 patient's oxygen requirements remain high not improving obtain CT chest to evaluate for additional pathology.   Elevated liver enzymes Results for Henry [name], Henry [name] (MRN [id number]) as of 12/14/2019 15:45  Ref. Range 12/10/2019 08:30 12/11/2019 10:00 12/12/2019 03:30 12/13/2019 04:48 12/14/2019 02:45  Alkaline Phosphatase Latest Ref Range: 38 - 126 U/L 79 77 68 68 76  Albumin Latest Ref Range: 3.5 - 5.0 g/dL 2.8 (L) 2.8 (L) 2.6 (L) 2.5 (L) 2.6 (L)  AST Latest Ref Range: 15 - 41 U/L 51 (H) 64 (H) 46 (H) 48 (H) 55 (H)  ALT Latest Ref Range: 0 - 44 U/L 125 (H) 148 (H) 130 (H) 118 (H) 120 (H)  -LE stable but not WNL   Hypotension -Resolved  Diabetes type  2 uncontrolled with complication -12/17 hemoglobin A1c= 7.5 -Levemir 17 units BID -NovoLog 7 units TID -Resistant SSI -Tradjenta 5 mg daily while we given  Hypoglycemia -D5W 82ml/hr continue until patient able to resume meals after abdominal ultrasound  Dysphagia?  -N.p.o. until cleared by swallow evaluation -Continue tube feeds via core track -12/27 PCXR shows increased patchy opacification right lower lobe base see results below -12/28 speech plans on evaluating patient on 12/29 for dysphagia.  Aspiration pneumonia -Trend procalcitonin -We will start empiric antibiotics Results for Henry [name], Henry [name] (MRN [id number]) as of 12/13/2019 12:08  Ref. Range 12/12/2019 16:50 12/13/2019 04:48  Procalcitonin Latest Units: ng/mL <0.10 <0.10  -Complete 5-day course antibiotics -Nonspecific findings in liver on ultrasound.  Recommend patient follow-up with hepatologist on discharge       DVT prophylaxis: Lovenox Code Status: Full Family Communication: 12/29 left message on [facility] phone that I had attempted to call and give an update Disposition Plan: TBD   Consultants:    Procedures/Significant Events:  12/17 Admitted to IMTS.  Started on steroids, remdesivir.   12/18 Placed on HHFNC overnight and transferred to ICU 12/20 Transferred to PCU at [facility] on heated high flow 12/21 Requiring 40 L HHFNC.  Received plasma and actemra. 12/27; PCXR-Stable asymmetric elevation right hemidiaphragm.  -Slight progression of patchy airspace disease at the right base. 12/28 US abdomen RUQ;-mildly echogenic liver parenchyma, a nonspecific finding that can be seen with hepatic steatosis or hepatic fibrosis.  Consider outpatient hepatic elastography for further liver fibrosis risk stratification, as clinically warranted.   I have personally reviewed and interpreted all radiology studies and my findings are as above.  VENTILATOR SETTINGS: HFNC+ NRB 12/29 Flow rate; 25 L/min FiO2;  100% SPO2 93%    Cultures 12/17 POC SARS coronavirus positive    Antimicrobials: Anti-infectives (From admission, onward)   Start     Dose/Rate Stop   12/03/19 1000  remdesivir 100 mg in sodium chloride 0.9 % 100 mL IVPB     100 mg 200 mL/hr over 30 Minutes 12/06/19 1103   11/25/2019 1315  remdesivir 200 mg in sodium chloride 0.9% 250 mL IVPB     200 mg 580 mL/hr over 30 Minutes 11/26/2019 1648       Devices    LINES / TUBES:      Continuous Infusions:  sodium chloride Stopped (12/04/19 0939)   ampicillin-sulbactam (UNASYN) IV 3 g (12/14/19 0500)     Objective: Vitals:   12/14/19 0400 12/14/19 0508 12/14/19 0735 12/14/19 0820  BP: 106/65  120/77 116/74  Pulse:   90 81  Resp: (!) 28  (!) 30 (!) 28  Temp: 98.7 F (37.1 C)  98.1 F (36.7 C)   TempSrc: Axillary  Oral   SpO2: (!) 87%  93% 93%  Weight:  66 kg    Height:        Intake/Output Summary (Last 24 hours) at 12/14/2019 0840 Last data filed at 12/14/2019 0600 Gross per 24 hour  Intake 1458.75 ml  Output 1350 ml  Net 108.75 ml   Filed Weights   12/05/19 0445 12/08/19 0500 12/14/19 0508  Weight: 78.4 kg 65.9 kg 66 kg      Physical Exam:  General: A/O x4,.  Positive acute respiratory distress Eyes: negative scleral hemorrhage, negative anisocoria, negative icterus ENT: Negative Runny nose, negative gingival bleeding, Neck:  Negative scars, masses, torticollis, lymphadenopathy, JVD Lungs: Clear to auscultation bilaterally without wheezes or crackles Cardiovascular: Regular rate and rhythm without murmur gallop or rub normal S1 and S2 Abdomen: negative abdominal pain, nondistended, positive soft, bowel sounds, no rebound, no ascites, no appreciable mass Extremities: No significant cyanosis, clubbing, or edema bilateral lower extremities Skin: Negative rashes, lesions, ulcers Psychiatric:  Negative depression, negative anxiety, negative fatigue, negative mania  Central nervous system:  Cranial  nerves II through XII intact, tongue/uvula midline, all extremities muscle strength 5/5, sensation intact throughout,  negative dysarthria, negative expressive aphasia, negative receptive aphasia.     Data Reviewed: Care during the described time interval was provided by me .  I have reviewed this patient's available data, including medical history, events of note, physical examination, and all test results as part of my evaluation.   CBC: Recent Labs  Lab 12/09/19 0250 12/10/19 0830 12/11/19 1000 12/12/19 0330 12/13/19 0448 12/14/19 0245  WBC 8.9 12.1* 12.8* 13.8* 11.0* 9.0  NEUTROABS 8.3* 11.5*  --  12.1* 9.1* 7.7  HGB 15.6 16.4 17.0 16.0 15.9 16.0  HCT 47.0 49.9 51.9 48.5 49.4 48.9  MCV 87.2 87.9 87.4 88.7 89.7 89.9  PLT 260 281 252 223 190 962   Basic Metabolic Panel: Recent Labs  Lab 12/09/19 1630 12/10/19 0830 12/11/19 1000 12/12/19 0330 12/13/19 0448 12/14/19 0245  NA  --  139 139 138 142 143  K  --  4.8 4.5 4.1 4.1 4.2  CL  --  105 102 102 104 109  CO2  --  21* 25 27 28 26   GLUCOSE  --  325* 220* 158* 98 174*  BUN  --  32* 30* 31* 30* 31*  CREATININE  --  0.69 0.64 0.58* 0.59* 0.57*  CALCIUM  --  8.3* 8.2* 8.3* 7.7* 8.1*  MG 2.2 2.2  --  2.2 2.3 2.3  PHOS 3.5 3.5  --  4.2 5.4* 4.2   GFR: Estimated Creatinine Clearance: 83.3 mL/min (A) (by C-G formula based on SCr of 0.57 mg/dL (L)). Liver Function Tests: Recent Labs  Lab 12/10/19 0830 12/11/19 1000 12/12/19 0330 12/13/19 0448 12/14/19 0245  AST 51* 64* 46* 48* 55*  ALT 125* 148* 130* 118* 120*  ALKPHOS 79 77 68 68 76  BILITOT 0.6 0.6 0.6 1.0 0.9  PROT 6.0* 5.9* 5.4* 5.2* 5.1*  ALBUMIN 2.8* 2.8* 2.6* 2.5* 2.6*   No results for input(s): LIPASE, AMYLASE in the last 168 hours. No results for input(s): AMMONIA in the last 168 hours. Coagulation Profile: No results for input(s): INR, PROTIME in the last 168 hours. Cardiac Enzymes: No results for input(s): CKTOTAL, CKMB, CKMBINDEX, TROPONINI in the  last 168 hours. BNP (last 3 results) No results for input(s): PROBNP in the last 8760 hours. HbA1C: No results for input(s): HGBA1C in the last 72 hours. CBG: Recent Labs  Lab 12/13/19 1626 12/13/19 1952 12/14/19 0029 12/14/19 0411 12/14/19 0737  GLUCAP 100* 158* 180* 200* 183*   Lipid Profile: No results for input(s): CHOL, HDL, LDLCALC, TRIG, CHOLHDL, LDLDIRECT in the last 72 hours. Thyroid Function Tests: No results for input(s): TSH, T4TOTAL, FREET4, T3FREE, THYROIDAB in the last 72 hours. Anemia Panel: Recent Labs    12/13/19 0448 12/14/19 0245  FERRITIN 249 258   Urine analysis:    Component Value Date/Time   COLORURINE YELLOW 12-09-2019 2341   APPEARANCEUR CLEAR 12/09/19 2341   LABSPEC 1.009 09-Dec-2019 2341   PHURINE 7.0 Dec 09, 2019 2341   GLUCOSEU 50 (A) 2019/12/09 2341   HGBUR NEGATIVE 12-09-2019 2341   BILIRUBINUR NEGATIVE Dec 09, 2019 2341   KETONESUR 5 (A) Dec 09, 2019 2341   PROTEINUR NEGATIVE 12-09-2019 2341   UROBILINOGEN 1.0 12/07/2007 1023   NITRITE NEGATIVE 2019/12/09 2341   LEUKOCYTESUR NEGATIVE 12/09/2019 2341   Sepsis Labs: (procalcitonin:4,lacticidven:4)  ) No results found for this or any previous visit (from the past 240 hour(s)).       Radiology Studies: DG CHEST PORT 1 VIEW  Result Date: 12/12/2019 CLINICAL DATA:  Aspiration. EXAM: PORTABLE CHEST 1 VIEW COMPARISON:  12/09/2019 FINDINGS: Cardiopericardial silhouette is at upper limits of normal for size. Stable asymmetric elevation right hemidiaphragm. Persistent interstitial and patchy airspace opacity in the mid and lower lungs bilaterally, but disease in the right base is slightly progressed. No substantial pleural effusion. A feeding tube passes into the stomach although the distal tip position is not included on the film. IMPRESSION: Slight progression of patchy airspace disease at the right base. Otherwise no substantial interval change. Electronically Signed   By: Kennith Center M.D.   On: 12/12/2019 14:19   US Abdomen Limited RUQ  Result Date: 12/13/2019 CLINICAL DATA:  Inpatient. COVID-19 pneumonia. Elevated liver enzymes. EXAM: ULTRASOUND ABDOMEN LIMITED RIGHT UPPER QUADRANT COMPARISON:  None. FINDINGS: Very limited scan due to bowel gas. Gallbladder: No gallstones or wall thickening visualized. No sonographic Murphy sign noted by sonographer. Common bile duct: Not discretely visualized. No intrahepatic biliary ductal dilatation demonstrated. Liver: Liver parenchyma is diffusely mildly echogenic. No definite liver surface irregularity. No liver mass is detected, acute noting decreased sensitivity in the setting of an echogenic liver. Portal  vein is patent on color Doppler imaging with normal direction of blood flow towards the liver. Other: None. IMPRESSION: 1. Limited scan. Mildly echogenic liver parenchyma, a nonspecific finding that can be seen with hepatic steatosis or hepatic fibrosis. Consider outpatient hepatic elastography for further liver fibrosis risk stratification, as clinically warranted. 2. Normal gallbladder, with no cholelithiasis. 3. No intrahepatic biliary ductal dilatation demonstrated. CBD not discretely visualized. If patient bilirubin levels are elevated, MRI/MRCP may be considered for further evaluation. Electronically Signed   By: Delbert PhenixJason A Poff M.D.   On: 12/13/2019 16:20        Scheduled Meds:  Chlorhexidine Gluconate Cloth  6 each Topical Daily   enoxaparin (LOVENOX) injection  40 mg Subcutaneous Q12H   feeding supplement (ENSURE ENLIVE)  237 mL Oral TID BM   feeding supplement (GLUCERNA 1.5 CAL)  1,000 mL Per Tube Q24H   feeding supplement (PRO-STAT SUGAR FREE 64)  30 mL Per Tube BID   furosemide  60 mg Intravenous Once   influenza vac split quadrivalent PF  0.5 mL Intramuscular Tomorrow-1000   insulin aspart  0-20 Units Subcutaneous TID WC   insulin aspart  0-5 Units Subcutaneous QHS   insulin aspart  7 Units  Subcutaneous TID WC   insulin detemir  17 Units Subcutaneous BID   linagliptin  5 mg Oral Daily   mouth rinse  15 mL Mouth Rinse BID   Continuous Infusions:  sodium chloride Stopped (12/04/19 0939)   ampicillin-sulbactam (UNASYN) IV 3 g (12/14/19 0500)     LOS: 12 days   The patient is critically ill with multiple organ systems failure and requires high complexity decision making for assessment and support, frequent evaluation and titration of therapies, application of advanced monitoring technologies and extensive interpretation of multiple databases. Critical Care Time devoted to patient care services described in this note  Time spent: 40 minutes     Gustav Knueppel, Roselind MessierURTIS J, MD [facility] Pager [phone]  If 7PM-7AM, please contact night-coverage [email or web address] Password [email or web address] 12/14/2019, 8:40 AM

## 2019-12-14 NOTE — Progress Notes (Signed)
  Speech Language Pathology Treatment: Dysphagia  Patient Details Name: Henry [name] MRN: [id number] DOB: 11/28/1956 Today's Date: 12/14/2019 Time: 4765-4650 SLP Time Calculation (min) (ACUTE ONLY): 33 min  Assessment / Plan / Recommendation Clinical Impression  Swallow assessment completed 12/27 with recommendation for dys 3/thin liquids, however was NPO following evaluation for abdominal ultrasound completed yesterday. This morning O2 requirements include HFNC and non rebreather mask. After several bites/sips O2 sats began to fall to 70% but returned to 85% when non rebreather donned. Over a period of 15 minutes he consumed 2-3 bites sips (grits/egg, water) remaining around 85% saturation in addition to increased work of breathing. Ceased po trials and pt reclined slightly. He sounds less congested than on 12/27and s/s aspiration were not present however risk significantly high due to current respiratory needs, therefore recommend NPO with occasional ice chips several times a shift with RN full supervision to moisten oral cavity and facilitate pharyngeal musculature while awaiting improvements in respiratory requirements. MBS can be performed once off non rebreather mask. As therapist documenting his sats are 94%.    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 Decreased po. Covid . RN reported pt coughing during lunch. CXR Slight progression of patchy airspace disease at the right base.      SLP Plan  Continue with current plan of care       Recommendations  Diet recommendations: NPO;Other(comment)(except ice chips after oral care) Medication Administration: Via alternative means                Oral Care Recommendations: Oral care BID Follow up Recommendations: None SLP Visit Diagnosis: Dysphagia, unspecified (R13.10) Plan: Continue with current plan of care                       Houston Siren 12/14/2019, 10:20 AM  Orbie Pyo Colvin Caroli.Ed Risk analyst [phone] Office [phone]

## 2019-12-15 ENCOUNTER — Inpatient Hospital Stay (HOSPITAL_COMMUNITY): Payer: HRSA Program

## 2019-12-15 LAB — CBC WITH DIFFERENTIAL/PLATELET
Abs Immature Granulocytes: 0.05 10*3/uL (ref 0.00–0.07)
Basophils Absolute: 0 10*3/uL (ref 0.0–0.1)
Basophils Relative: 0 %
Eosinophils Absolute: 0.2 10*3/uL (ref 0.0–0.5)
Eosinophils Relative: 2 %
HCT: 47.8 % (ref 39.0–52.0)
Hemoglobin: 15.2 g/dL (ref 13.0–17.0)
Immature Granulocytes: 1 %
Lymphocytes Relative: 11 %
Lymphs Abs: 0.8 10*3/uL (ref 0.7–4.0)
MCH: 29 pg (ref 26.0–34.0)
MCHC: 31.8 g/dL (ref 30.0–36.0)
MCV: 91 fL (ref 80.0–100.0)
Monocytes Absolute: 0.3 10*3/uL (ref 0.1–1.0)
Monocytes Relative: 4 %
Neutro Abs: 6.1 10*3/uL (ref 1.7–7.7)
Neutrophils Relative %: 82 %
Platelets: 157 10*3/uL (ref 150–400)
RBC: 5.25 MIL/uL (ref 4.22–5.81)
RDW: 15.1 % (ref 11.5–15.5)
WBC: 7.4 10*3/uL (ref 4.0–10.5)
nRBC: 0.3 % — ABNORMAL HIGH (ref 0.0–0.2)

## 2019-12-15 LAB — COMPREHENSIVE METABOLIC PANEL
ALT: 108 U/L — ABNORMAL HIGH (ref 0–44)
AST: 51 U/L — ABNORMAL HIGH (ref 15–41)
Albumin: 2.5 g/dL — ABNORMAL LOW (ref 3.5–5.0)
Alkaline Phosphatase: 73 U/L (ref 38–126)
Anion gap: 8 (ref 5–15)
BUN: 26 mg/dL — ABNORMAL HIGH (ref 8–23)
CO2: 24 mmol/L (ref 22–32)
Calcium: 7.8 mg/dL — ABNORMAL LOW (ref 8.9–10.3)
Chloride: 111 mmol/L (ref 98–111)
Creatinine, Ser: 0.62 mg/dL (ref 0.61–1.24)
GFR calc Af Amer: >60 mL/min (ref >60)
GFR calc non Af Amer: >60 mL/min (ref >60)
Glucose, Bld: 127 mg/dL — ABNORMAL HIGH (ref 70–99)
Potassium: 4 mmol/L (ref 3.5–5.1)
Sodium: 143 mmol/L (ref 135–145)
Total Bilirubin: 1.1 mg/dL (ref 0.3–1.2)
Total Protein: 5 g/dL — ABNORMAL LOW (ref 6.5–8.1)

## 2019-12-15 LAB — GLUCOSE, CAPILLARY
Glucose-Capillary: 115 mg/dL — ABNORMAL HIGH (ref 70–99)
Glucose-Capillary: 158 mg/dL — ABNORMAL HIGH (ref 70–99)
Glucose-Capillary: 192 mg/dL — ABNORMAL HIGH (ref 70–99)
Glucose-Capillary: 176 mg/dL — ABNORMAL HIGH (ref 70–99)
Glucose-Capillary: 134 mg/dL — ABNORMAL HIGH (ref 70–99)

## 2019-12-15 LAB — D-DIMER, QUANTITATIVE: D-Dimer, Quant: 3.85 ug/mL-FEU — ABNORMAL HIGH (ref 0.00–0.50)

## 2019-12-15 LAB — FERRITIN: Ferritin: 250 ng/mL (ref 24–336)

## 2019-12-15 LAB — C-REACTIVE PROTEIN: CRP: 1.2 mg/dL — ABNORMAL HIGH (ref <1.0)

## 2019-12-15 LAB — MAGNESIUM: Magnesium: 2.3 mg/dL (ref 1.7–2.4)

## 2019-12-15 LAB — PHOSPHORUS: Phosphorus: 3.9 mg/dL (ref 2.5–4.6)

## 2019-12-15 NOTE — Progress Notes (Signed)
SLP Cancellation Note  Patient Details Name: Henry [name] MRN: [id number] DOB: 06-09-1956   Cancelled treatment:        Per RN, pt place on trial of modified respiratory set up to travel for CT and MBS however he is not safe and unable to have respiratory set up to leave room. He may be able to have FFES (portable and disposable) in room tomorrow- SLP will work on getting this set up and will leave updates in chart. For now strictly follow aspiration precautions for continued Dys 3 texture, thin, no straws, rest breaks and defer po's if sats in low 80's or tachypneic.     Houston Siren 12/15/2019, 11:08 AM Orbie Pyo Colvin Caroli.Ed Actor [phone]

## 2019-12-15 NOTE — Progress Notes (Signed)
  Left message with pt contact, Johnny.

## 2019-12-15 NOTE — Progress Notes (Signed)
Pharmacy Antibiotic Note  Henry [name] is a 63 y.o. male admitted on 12/06/2019 with pneumonia.  Pharmacy has been consulted for Unasyn dosing.  Pt has been here for COVID. Unasyn is being ordered for aspiration PNA.  Normal renal function. Blood cultures are neg. PCT <0.1.  Plan: Unasyn 3 g IV q6h Set stop date as 5 days per [facility] note Pharmacy signing off - please re-consult if needed  Height: 5\' 5"  (165.1 cm) Weight: 158 lb 15.2 oz (72.1 kg) IBW/kg (Calculated) : 61.5  Temp (24hrs), Avg:98.4 F (36.9 C), Min:97.7 F (36.5 C), Max:99.3 F (37.4 C)  Recent Labs  Lab 12/11/19 1000 12/12/19 0330 12/13/19 0448 12/14/19 0245 12/15/19 0132  WBC 12.8* 13.8* 11.0* 9.0 7.4  CREATININE 0.64 0.58* 0.59* 0.57* 0.62    Estimated Creatinine Clearance: 83.3 mL/min (by C-G formula based on SCr of 0.62 mg/dL).    Not on File  Antimicrobials this admission: 12/27 Unasyn>>  Dose adjustments this admission:   Microbiology results: 12/17 blood>>neg  Thank you for involving pharmacy in this patient's care.  Renold Genta, PharmD, BCPS Clinical Pharmacist Clinical phone for 12/15/2019 until 3:30p is [phone] 12/15/2019 2:06 PM  **Pharmacist phone directory can be found on [email or web address] listed under [place]**

## 2019-12-15 NOTE — Progress Notes (Signed)
  Speech Language Pathology Treatment: Dysphagia  Patient Details Name: Henry [name] MRN: [id number] DOB: 27-Dec-1955 Today's Date: 12/15/2019 Time: 4650-3546 SLP Time Calculation (min) (ACUTE ONLY): 16 min  Assessment / Plan / Recommendation Clinical Impression  Goal for treatment session concentrating on baseline respiratory status, tolerance of mechanical soft texture, thin liquids and appropriateness for MBS. He is off non rebreather today on HFNC 40 L with improved sustained sats today ranging from 85-95%, average approx 89%. Less tachypneic noted at rest and after cup sips thin and puree (he did not wish to eat the chopped sausage or french toast due to stomach issues-also appeared hard and cold. No cough, throat clear or pharyngeal congestion. He did exhibit mildly increased work of breathing. Given significant respiratory requirements and po consumption, MBS recommended. SLP, rehab tech and RT (getting in touch with them) to transport pt to CT to receive chest CT first then MBS following.    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 Decreased po. Covid . RN reported pt coughing during lunch. CXR Slight progression of patchy airspace disease at the right base.      SLP Plan  MBS       Recommendations  Diet recommendations: Dysphagia 3 (mechanical soft);Thin liquid Liquids provided via: Cup;No straw Medication Administration: Whole meds with puree Supervision: Patient able to self feed;Intermittent supervision to cue for compensatory strategies Compensations: Slow rate;Small sips/bites(rest breaks) Postural Changes and/or Swallow Maneuvers: Seated upright 90 degrees                Oral Care Recommendations: Oral care BID Follow up Recommendations: Other (comment)(TBA) SLP Visit Diagnosis: Dysphagia, unspecified (R13.10) Plan: MBS       GO                Henry [name] 12/15/2019, 9:44 AM  ST phone [phone]

## 2019-12-15 NOTE — Progress Notes (Signed)
PROGRESS NOTE    Henry [name]  DGU:[id number] DOB: 08-May-1956 DOA: 12/05/2019 PCP: Patient, No Pcp Per   Brief Narrative:  63 yo Hispanic male (Spanish-speaking) PMHx diabetes type 2 uncontrolled with complication, with Z5GL presenting with AHRF 2/2 COVID 19 pneumonia   Subjective: 12/30 afebrile last 24 hours, negative CP, negative abdominal pain.  Positive S OB    Assessment & Plan:   Active Problems:   Acute respiratory failure with hypoxia (HCC)   COVID-19   Hyperglycemia   Acute hypoxemic respiratory failure (HCC)   Pneumonia due to COVID-19 virus   Elevated liver enzymes   Diabetes mellitus type 2, uncontrolled, with complications (HCC)  Covid pneumonia/acute respiratory failure with hypoxia COVID-19 Labs  Recent Labs    12/13/19 0448 12/14/19 0245 12/15/19 0132  DDIMER 3.40* 3.78* 3.85*  FERRITIN 249 258 250  CRP <0.5 0.7 1.2*    12/17 POC SARS coronavirus positive -Complete course of Remdesivir.  Last dose 12/21 -Completed 10-day course of steroids.  Last dose 10/26 -12/21 transfused 1 unit Covid convalescent plasma -12/21 Actemra x1 -Strict in and out -2.2 L -Daily weight Filed Weights   12/08/19 0500 12/14/19 0508 12/15/19 0500  Weight: 65.9 kg 66 kg 72.1 kg  -12/27 Lasix 60 mg x 1 -Lasix as needed -12/ 30 patient's oxygen requirements remain high not improving obtain CT chest to evaluate for additional pathology.   Elevated liver enzymes Results for Henry [name], Henry [name] (MRN [id number]) as of 12/14/2019 15:45  Ref. Range 12/10/2019 08:30 12/11/2019 10:00 12/12/2019 03:30 12/13/2019 04:48 12/14/2019 02:45  Alkaline Phosphatase Latest Ref Range: 38 - 126 U/L 79 77 68 68 76  Albumin Latest Ref Range: 3.5 - 5.0 g/dL 2.8 (L) 2.8 (L) 2.6 (L) 2.5 (L) 2.6 (L)  AST Latest Ref Range: 15 - 41 U/L 51 (H) 64 (H) 46 (H) 48 (H) 55 (H)  ALT Latest Ref Range: 0 - 44 U/L 125 (H) 148 (H) 130 (H) 118 (H) 120 (H)  -LE stable but not WNL   Hypotension -Resolved  Diabetes  type 2 uncontrolled with complication -51/88 hemoglobin A1c= 7.5 -Levemir 17 units BID -NovoLog 7 units TID -Resistant SSI -Tradjenta 5 mg daily while we given  Hypoglycemia -D5W 58ml/hr continue until patient able to resume meals after abdominal ultrasound  Dysphagia?  -N.p.o. until cleared by swallow evaluation -Continue tube feeds via core track -12/27 PCXR shows increased patchy opacification right lower lobe base see results below -12/29 patient speech evaluated patient.  Failed swallow study recommend n.p.o.    Aspiration pneumonia -Trend procalcitonin -We will start empiric antibiotics Results for Henry [name], Henry [name] (MRN [id number]) as of 12/13/2019 12:08  Ref. Range 12/12/2019 16:50 12/13/2019 04:48  Procalcitonin Latest Units: ng/mL <0.10 <0.10  -Complete 5-day course antibiotics -Nonspecific findings in liver on ultrasound.  Recommend patient follow-up with hepatologist on discharge       DVT prophylaxis: Lovenox Code Status: Full Family Communication: 12/29 left message on [facility] phone that I had attempted to call and give an update Disposition Plan: TBD   Consultants:    Procedures/Significant Events:  12/17 Admitted to IMTS.  Started on steroids, remdesivir.   12/18 Placed on [place] overnight and transferred to ICU 12/20 Transferred to PCU at [facility] on heated high flow 12/21 Requiring 40 L HHFNC.  Received plasma and actemra. 12/27; PCXR-Stable asymmetric elevation right hemidiaphragm.  -Slight progression of patchy airspace disease at the right base. 12/28 US abdomen RUQ;-mildly echogenic liver parenchyma, a nonspecific finding that can be seen  with hepatic steatosis or hepatic fibrosis. Consider outpatient hepatic elastography for further liver fibrosis risk stratification, as clinically warranted.   I have personally reviewed and interpreted all radiology studies and my findings are as above.  VENTILATOR SETTINGS: HFNC 12/30 Flow rate; 40  L/min FiO2; 100% SPO2 91%    Cultures 12/17 POC SARS coronavirus positive    Antimicrobials: Anti-infectives (From admission, onward)   Start     Dose/Rate Stop   12/03/19 1000  remdesivir 100 mg in sodium chloride 0.9 % 100 mL IVPB     100 mg 200 mL/hr over 30 Minutes 12/06/19 1103   2018-12-26 1315  remdesivir 200 mg in sodium chloride 0.9% 250 mL IVPB     200 mg 580 mL/hr over 30 Minutes 2018-12-26 1648       Devices    LINES / TUBES:      Continuous Infusions: . sodium chloride Stopped (12/04/19 0939)  . ampicillin-sulbactam (UNASYN) IV 3 g (12/15/19 0512)  . feeding supplement (GLUCERNA 1.5 CAL) 65 mL/hr at 12/14/19 2000     Objective: Vitals:   12/15/19 0530 12/15/19 0630 12/15/19 0700 12/15/19 0800  BP: (!) 130/98 94/63 99/65  107/69  Pulse: 94 73 74 78  Resp: 19 (!) 39 (!) 35 (!) 35  Temp:      TempSrc:      SpO2: (!) 88% 99% 97% 91%  Weight:      Height:        Intake/Output Summary (Last 24 hours) at 12/15/2019 0915 Last data filed at 12/15/2019 0700 Gross per 24 hour  Intake 1365 ml  Output 625 ml  Net 740 ml   Filed Weights   12/08/19 0500 12/14/19 0508 12/15/19 0500  Weight: 65.9 kg 66 kg 72.1 kg   Physical Exam:  General: A/O x4, positive acute respiratory distress Eyes: negative scleral hemorrhage, negative anisocoria, negative icterus ENT: Negative Runny nose, negative gingival bleeding, Neck:  Negative scars, masses, torticollis, lymphadenopathy, JVD Lungs: Tachypneic, clear to auscultation bilaterally without wheezes or crackles Cardiovascular: Regular rate and rhythm without murmur gallop or rub normal S1 and S2 Abdomen: negative abdominal pain, nondistended, positive soft, bowel sounds, no rebound, no ascites, no appreciable mass Extremities: No significant cyanosis, clubbing, or edema bilateral lower extremities Skin: Negative rashes, lesions, ulcers Psychiatric:  Negative depression, negative anxiety, negative fatigue,  negative mania  Central nervous system:  Cranial nerves II through XII intact, tongue/uvula midline, all extremities muscle strength 5/5, sensation intact throughout, negative dysarthria, negative expressive aphasia, negative receptive aphasia.    Data Reviewed: Care during the described time interval was provided by me .  I have reviewed this patient's available data, including medical history, events of note, physical examination, and all test results as part of my evaluation.   CBC: Recent Labs  Lab 12/10/19 0830 12/11/19 1000 12/12/19 0330 12/13/19 0448 12/14/19 0245 12/15/19 0132  WBC 12.1* 12.8* 13.8* 11.0* 9.0 7.4  NEUTROABS 11.5*  --  12.1* 9.1* 7.7 6.1  HGB 16.4 17.0 16.0 15.9 16.0 15.2  HCT 49.9 51.9 48.5 49.4 48.9 47.8  MCV 87.9 87.4 88.7 89.7 89.9 91.0  PLT 281 252 223 190 182 157   Basic Metabolic Panel: Recent Labs  Lab 12/10/19 0830 12/11/19 1000 12/12/19 0330 12/13/19 0448 12/14/19 0245 12/15/19 0132  NA 139 139 138 142 143 143  K 4.8 4.5 4.1 4.1 4.2 4.0  CL 105 102 102 104 109 111  CO2 21* 25 27 28 26 24   GLUCOSE 325* 220* 158*  98 174* 127*  BUN 32* 30* 31* 30* 31* 26*  CREATININE 0.69 0.64 0.58* 0.59* 0.57* 0.62  CALCIUM 8.3* 8.2* 8.3* 7.7* 8.1* 7.8*  MG 2.2  --  2.2 2.3 2.3 2.3  PHOS 3.5  --  4.2 5.4* 4.2 3.9   GFR: Estimated Creatinine Clearance: 83.3 mL/min (by C-G formula based on SCr of 0.62 mg/dL). Liver Function Tests: Recent Labs  Lab 12/11/19 1000 12/12/19 0330 12/13/19 0448 12/14/19 0245 12/15/19 0132  AST 64* 46* 48* 55* 51*  ALT 148* 130* 118* 120* 108*  ALKPHOS 77 68 68 76 73  BILITOT 0.6 0.6 1.0 0.9 1.1  PROT 5.9* 5.4* 5.2* 5.1* 5.0*  ALBUMIN 2.8* 2.6* 2.5* 2.6* 2.5*   No results for input(s): LIPASE, AMYLASE in the last 168 hours. No results for input(s): AMMONIA in the last 168 hours. Coagulation Profile: No results for input(s): INR, PROTIME in the last 168 hours. Cardiac Enzymes: No results for input(s): CKTOTAL,  CKMB, CKMBINDEX, TROPONINI in the last 168 hours. BNP (last 3 results) No results for input(s): PROBNP in the last 8760 hours. HbA1C: No results for input(s): HGBA1C in the last 72 hours. CBG: Recent Labs  Lab 12/14/19 1128 12/14/19 1621 12/14/19 1954 12/14/19 2321 12/15/19 0323  GLUCAP 125* 129* 117* 124* 115*   Lipid Profile: No results for input(s): CHOL, HDL, LDLCALC, TRIG, CHOLHDL, LDLDIRECT in the last 72 hours. Thyroid Function Tests: No results for input(s): TSH, T4TOTAL, FREET4, T3FREE, THYROIDAB in the last 72 hours. Anemia Panel: Recent Labs    12/14/19 0245 12/15/19 0132  FERRITIN 258 250   Urine analysis:    Component Value Date/Time   COLORURINE YELLOW 23-Dec-2019 2341   APPEARANCEUR CLEAR 12-23-19 2341   LABSPEC 1.009 December 23, 2019 2341   PHURINE 7.0 12/23/19 2341   GLUCOSEU 50 (A) 23-Dec-2019 2341   HGBUR NEGATIVE 12-23-19 2341   BILIRUBINUR NEGATIVE 12/23/2019 2341   KETONESUR 5 (A) 2019-12-23 2341   PROTEINUR NEGATIVE 12/23/19 2341   UROBILINOGEN 1.0 12/07/2007 1023   NITRITE NEGATIVE 2019/12/23 2341   LEUKOCYTESUR NEGATIVE 2019-12-23 2341   Sepsis Labs: (procalcitonin:4,lacticidven:4)  ) No results found for this or any previous visit (from the past 240 hour(s)).       Radiology Studies: US Abdomen Limited RUQ  Result Date: 12/13/2019 CLINICAL DATA:  Inpatient. COVID-19 pneumonia. Elevated liver enzymes. EXAM: ULTRASOUND ABDOMEN LIMITED RIGHT UPPER QUADRANT COMPARISON:  None. FINDINGS: Very limited scan due to bowel gas. Gallbladder: No gallstones or wall thickening visualized. No sonographic Murphy sign noted by sonographer. Common bile duct: Not discretely visualized. No intrahepatic biliary ductal dilatation demonstrated. Liver: Liver parenchyma is diffusely mildly echogenic. No definite liver surface irregularity. No liver mass is detected, acute noting decreased sensitivity in the setting of an echogenic liver. Portal vein  is patent on color Doppler imaging with normal direction of blood flow towards the liver. Other: None. IMPRESSION: 1. Limited scan. Mildly echogenic liver parenchyma, a nonspecific finding that can be seen with hepatic steatosis or hepatic fibrosis. Consider outpatient hepatic elastography for further liver fibrosis risk stratification, as clinically warranted. 2. Normal gallbladder, with no cholelithiasis. 3. No intrahepatic biliary ductal dilatation demonstrated. CBD not discretely visualized. If patient bilirubin levels are elevated, MRI/MRCP may be considered for further evaluation. Electronically Signed   By: Delbert Phenix M.D.   On: 12/13/2019 16:20        Scheduled Meds: . Chlorhexidine Gluconate Cloth  6 each Topical Daily  . enoxaparin (LOVENOX) injection  40 mg Subcutaneous  Q12H  . feeding supplement (ENSURE ENLIVE)  237 mL Oral TID BM  . furosemide  60 mg Intravenous Once  . influenza vac split quadrivalent PF  0.5 mL Intramuscular Tomorrow-1000  . insulin aspart  0-20 Units Subcutaneous TID WC  . insulin aspart  0-5 Units Subcutaneous QHS  . insulin aspart  7 Units Subcutaneous TID WC  . insulin detemir  17 Units Subcutaneous BID  . linagliptin  5 mg Oral Daily  . mouth rinse  15 mL Mouth Rinse BID   Continuous Infusions: . sodium chloride Stopped (12/04/19 0939)  . ampicillin-sulbactam (UNASYN) IV 3 g (12/15/19 0512)  . feeding supplement (GLUCERNA 1.5 CAL) 65 mL/hr at 12/14/19 2000     LOS: 13 days   The patient is critically ill with multiple organ systems failure and requires high complexity decision making for assessment and support, frequent evaluation and titration of therapies, application of advanced monitoring technologies and extensive interpretation of multiple databases. Critical Care Time devoted to patient care services described in this note  Time spent: 40 minutes     Dayani Winbush, Roselind Messier, MD [facility] Pager [phone]  If 7PM-7AM, please  contact night-coverage [email or web address] Password [email or web address] 12/15/2019, 9:15 AM

## 2019-12-16 ENCOUNTER — Inpatient Hospital Stay (HOSPITAL_COMMUNITY): Payer: HRSA Program

## 2019-12-16 ENCOUNTER — Other Ambulatory Visit: Payer: Self-pay

## 2019-12-16 DIAGNOSIS — R1310 Dysphagia, unspecified: Secondary | ICD-10-CM

## 2019-12-16 DIAGNOSIS — E162 Hypoglycemia, unspecified: Secondary | ICD-10-CM

## 2019-12-16 DIAGNOSIS — E1165 Type 2 diabetes mellitus with hyperglycemia: Secondary | ICD-10-CM

## 2019-12-16 DIAGNOSIS — J80 Acute respiratory distress syndrome: Secondary | ICD-10-CM

## 2019-12-16 DIAGNOSIS — E1169 Type 2 diabetes mellitus with other specified complication: Secondary | ICD-10-CM

## 2019-12-16 DIAGNOSIS — E118 Type 2 diabetes mellitus with unspecified complications: Secondary | ICD-10-CM

## 2019-12-16 DIAGNOSIS — Z789 Other specified health status: Secondary | ICD-10-CM

## 2019-12-16 DIAGNOSIS — R748 Abnormal levels of other serum enzymes: Secondary | ICD-10-CM

## 2019-12-16 LAB — BASIC METABOLIC PANEL
Anion gap: 5 (ref 5–15)
BUN: 22 mg/dL (ref 8–23)
CO2: 30 mmol/L (ref 22–32)
Calcium: 7.5 mg/dL — ABNORMAL LOW (ref 8.9–10.3)
Chloride: 110 mmol/L (ref 98–111)
Creatinine, Ser: 0.74 mg/dL (ref 0.61–1.24)
GFR calc Af Amer: >60 mL/min (ref >60)
GFR calc non Af Amer: >60 mL/min (ref >60)
Glucose, Bld: 154 mg/dL — ABNORMAL HIGH (ref 70–99)
Potassium: 5.2 mmol/L — ABNORMAL HIGH (ref 3.5–5.1)
Sodium: 145 mmol/L (ref 135–145)

## 2019-12-16 LAB — GLUCOSE, CAPILLARY
Glucose-Capillary: 102 mg/dL — ABNORMAL HIGH (ref 70–99)
Glucose-Capillary: 183 mg/dL — ABNORMAL HIGH (ref 70–99)
Glucose-Capillary: 185 mg/dL — ABNORMAL HIGH (ref 70–99)
Glucose-Capillary: 186 mg/dL — ABNORMAL HIGH (ref 70–99)
Glucose-Capillary: 78 mg/dL (ref 70–99)
Glucose-Capillary: 89 mg/dL (ref 70–99)
Glucose-Capillary: 95 mg/dL (ref 70–99)

## 2019-12-16 LAB — POCT I-STAT 7, (LYTES, BLD GAS, ICA,H+H)
Acid-base deficit: 4 mmol/L — ABNORMAL HIGH (ref 0.0–2.0)
Bicarbonate: 29.4 mmol/L — ABNORMAL HIGH (ref 20.0–28.0)
Calcium, Ion: 1.18 mmol/L (ref 1.15–1.40)
HCT: 52 % (ref 39.0–52.0)
Hemoglobin: 17.7 g/dL — ABNORMAL HIGH (ref 13.0–17.0)
O2 Saturation: 92 %
Potassium: 5.1 mmol/L (ref 3.5–5.1)
Sodium: 146 mmol/L — ABNORMAL HIGH (ref 135–145)
TCO2: 32 mmol/L (ref 22–32)
pCO2 arterial: 94.2 mmHg (ref 32.0–48.0)
pH, Arterial: 7.102 — CL (ref 7.350–7.450)
pO2, Arterial: 90 mmHg (ref 83.0–108.0)

## 2019-12-16 LAB — COMPREHENSIVE METABOLIC PANEL
ALT: 142 U/L — ABNORMAL HIGH (ref 0–44)
AST: 73 U/L — ABNORMAL HIGH (ref 15–41)
Albumin: 2.6 g/dL — ABNORMAL LOW (ref 3.5–5.0)
Alkaline Phosphatase: 79 U/L (ref 38–126)
Anion gap: 11 (ref 5–15)
BUN: 21 mg/dL (ref 8–23)
CO2: 23 mmol/L (ref 22–32)
Calcium: 7.9 mg/dL — ABNORMAL LOW (ref 8.9–10.3)
Chloride: 111 mmol/L (ref 98–111)
Creatinine, Ser: 0.61 mg/dL (ref 0.61–1.24)
GFR calc Af Amer: >60 mL/min (ref >60)
GFR calc non Af Amer: >60 mL/min (ref >60)
Glucose, Bld: 43 mg/dL — CL (ref 70–99)
Potassium: 3.8 mmol/L (ref 3.5–5.1)
Sodium: 145 mmol/L (ref 135–145)
Total Bilirubin: 1.1 mg/dL (ref 0.3–1.2)
Total Protein: 5 g/dL — ABNORMAL LOW (ref 6.5–8.1)

## 2019-12-16 LAB — C-REACTIVE PROTEIN: CRP: 0.7 mg/dL (ref <1.0)

## 2019-12-16 LAB — CBC WITH DIFFERENTIAL/PLATELET
Abs Immature Granulocytes: 0.04 10*3/uL (ref 0.00–0.07)
Basophils Absolute: 0 10*3/uL (ref 0.0–0.1)
Basophils Relative: 0 %
Eosinophils Absolute: 0.2 10*3/uL (ref 0.0–0.5)
Eosinophils Relative: 2 %
HCT: 50.1 % (ref 39.0–52.0)
Hemoglobin: 16 g/dL (ref 13.0–17.0)
Immature Granulocytes: 1 %
Lymphocytes Relative: 12 %
Lymphs Abs: 1.1 10*3/uL (ref 0.7–4.0)
MCH: 29.4 pg (ref 26.0–34.0)
MCHC: 31.9 g/dL (ref 30.0–36.0)
MCV: 91.9 fL (ref 80.0–100.0)
Monocytes Absolute: 0.3 10*3/uL (ref 0.1–1.0)
Monocytes Relative: 4 %
Neutro Abs: 7.2 10*3/uL (ref 1.7–7.7)
Neutrophils Relative %: 81 %
Platelets: 150 10*3/uL (ref 150–400)
RBC: 5.45 MIL/uL (ref 4.22–5.81)
RDW: 15.5 % (ref 11.5–15.5)
WBC: 8.9 10*3/uL (ref 4.0–10.5)
nRBC: 0 % (ref 0.0–0.2)

## 2019-12-16 LAB — D-DIMER, QUANTITATIVE: D-Dimer, Quant: 3.44 ug/mL-FEU — ABNORMAL HIGH (ref 0.00–0.50)

## 2019-12-16 LAB — PHOSPHORUS: Phosphorus: 3.2 mg/dL (ref 2.5–4.6)

## 2019-12-16 LAB — MAGNESIUM: Magnesium: 2.2 mg/dL (ref 1.7–2.4)

## 2019-12-16 LAB — FERRITIN: Ferritin: 237 ng/mL (ref 24–336)

## 2019-12-16 MED ORDER — ORAL CARE MOUTH RINSE
15.0000 mL | OROMUCOSAL | Status: DC
  Administered 2019-12-16 – 2020-01-04 (×182): 15 mL via OROMUCOSAL

## 2019-12-16 MED ORDER — VECURONIUM BROMIDE 10 MG IV SOLR
0.1000 mg/kg | INTRAVENOUS | Status: DC | PRN
  Administered 2019-12-16: 7.2 mg via INTRAVENOUS
  Filled 2019-12-16: qty 10

## 2019-12-16 MED ORDER — MIDAZOLAM BOLUS VIA INFUSION
1.0000 mg | INTRAVENOUS | Status: DC | PRN
  Administered 2019-12-16 (×3): 2 mg via INTRAVENOUS
  Filled 2019-12-16: qty 2

## 2019-12-16 MED ORDER — INSULIN ASPART 100 UNIT/ML ~~LOC~~ SOLN
0.0000 [IU] | SUBCUTANEOUS | Status: DC
  Administered 2019-12-16 (×2): 4 [IU] via SUBCUTANEOUS
  Administered 2019-12-17: 20:00:00 3 [IU] via SUBCUTANEOUS
  Administered 2019-12-17 – 2019-12-18 (×4): 4 [IU] via SUBCUTANEOUS
  Administered 2019-12-18: 3 [IU] via SUBCUTANEOUS
  Administered 2019-12-18 (×3): 4 [IU] via SUBCUTANEOUS
  Administered 2019-12-19: 3 [IU] via SUBCUTANEOUS

## 2019-12-16 MED ORDER — MIDAZOLAM 50MG/50ML (1MG/ML) PREMIX INFUSION
0.0000 mg/h | INTRAVENOUS | Status: DC
  Administered 2019-12-16: 2 mg/h via INTRAVENOUS
  Administered 2019-12-16 – 2019-12-17 (×2): 6 mg/h via INTRAVENOUS
  Filled 2019-12-16 (×3): qty 50

## 2019-12-16 MED ORDER — FENTANYL CITRATE (PF) 100 MCG/2ML IJ SOLN
50.0000 ug | Freq: Once | INTRAMUSCULAR | Status: AC
  Administered 2019-12-16: 50 ug via INTRAVENOUS

## 2019-12-16 MED ORDER — MIDAZOLAM HCL 2 MG/2ML IJ SOLN
2.0000 mg | Freq: Once | INTRAMUSCULAR | Status: AC
  Administered 2019-12-16: 2 mg via INTRAVENOUS

## 2019-12-16 MED ORDER — VANCOMYCIN HCL 1500 MG/300ML IV SOLN
1500.0000 mg | Freq: Once | INTRAVENOUS | Status: AC
  Administered 2019-12-16: 1500 mg via INTRAVENOUS
  Filled 2019-12-16: qty 300

## 2019-12-16 MED ORDER — PANTOPRAZOLE SODIUM 40 MG IV SOLR
40.0000 mg | Freq: Every day | INTRAVENOUS | Status: DC
  Administered 2019-12-16 – 2019-12-17 (×2): 40 mg via INTRAVENOUS
  Filled 2019-12-16 (×2): qty 40

## 2019-12-16 MED ORDER — FENTANYL BOLUS VIA INFUSION
50.0000 ug | INTRAVENOUS | Status: DC | PRN
  Administered 2019-12-16 – 2019-12-17 (×2): 50 ug via INTRAVENOUS
  Filled 2019-12-16: qty 50

## 2019-12-16 MED ORDER — FENTANYL 2500MCG IN NS 250ML (10MCG/ML) PREMIX INFUSION
50.0000 ug/h | INTRAVENOUS | Status: DC
  Administered 2019-12-16: 50 ug/h via INTRAVENOUS
  Administered 2019-12-17: 200 ug/h via INTRAVENOUS
  Filled 2019-12-16 (×2): qty 250

## 2019-12-16 MED ORDER — ROCURONIUM BROMIDE 10 MG/ML (PF) SYRINGE
60.0000 mg | PREFILLED_SYRINGE | Freq: Once | INTRAVENOUS | Status: AC
  Administered 2019-12-16: 60 mg via INTRAVENOUS

## 2019-12-16 MED ORDER — CHLORHEXIDINE GLUCONATE 0.12% ORAL RINSE (MEDLINE KIT)
15.0000 mL | Freq: Two times a day (BID) | OROMUCOSAL | Status: DC
  Administered 2019-12-16 – 2019-12-19 (×7): 15 mL via OROMUCOSAL

## 2019-12-16 MED ORDER — ETOMIDATE 2 MG/ML IV SOLN
20.0000 mg | Freq: Once | INTRAVENOUS | Status: AC
  Administered 2019-12-16: 20 mg via INTRAVENOUS

## 2019-12-16 MED ORDER — SODIUM CHLORIDE 0.9 % IV SOLN
2.0000 g | Freq: Three times a day (TID) | INTRAVENOUS | Status: DC
  Administered 2019-12-16 – 2019-12-19 (×9): 2 g via INTRAVENOUS
  Filled 2019-12-16 (×9): qty 2

## 2019-12-16 MED ORDER — VANCOMYCIN HCL IN DEXTROSE 1-5 GM/200ML-% IV SOLN
1000.0000 mg | Freq: Two times a day (BID) | INTRAVENOUS | Status: DC
  Administered 2019-12-17 – 2019-12-18 (×3): 1000 mg via INTRAVENOUS
  Filled 2019-12-16 (×3): qty 200

## 2019-12-16 MED ORDER — VECURONIUM BROMIDE 10 MG IV SOLR
8.0000 mg | INTRAVENOUS | Status: DC | PRN
  Administered 2019-12-16 – 2019-12-17 (×6): 8 mg via INTRAVENOUS
  Filled 2019-12-16 (×6): qty 10

## 2019-12-16 MED ORDER — SODIUM CHLORIDE 0.9 % IV SOLN
INTRAVENOUS | Status: DC | PRN
  Administered 2019-12-17: 10 mL/h via INTRA_ARTERIAL

## 2019-12-16 MED ORDER — SODIUM BICARBONATE 8.4 % IV SOLN
INTRAVENOUS | Status: DC
  Filled 2019-12-16 (×4): qty 150

## 2019-12-16 MED ORDER — ARTIFICIAL TEARS OPHTHALMIC OINT
1.0000 "application " | TOPICAL_OINTMENT | Freq: Three times a day (TID) | OPHTHALMIC | Status: DC
  Administered 2019-12-16 – 2019-12-17 (×2): 1 via OPHTHALMIC
  Filled 2019-12-16 (×2): qty 3.5

## 2019-12-16 NOTE — Progress Notes (Signed)
Pt increasingly requiring more oxygen throughout shift. Pt on HHFNC @45L . Pt tachypneic and oxygen saturation fluctuating from 75-91%. Pt desaturating with movement and takes significant amount of time to return to baseline. Pt states that it is hard to breathe, becoming intermittently anxious/agitated. Dr Sherral Hammers aware, ICU team notified and at bedside. Pt moved to ICU at this time.

## 2019-12-16 NOTE — Progress Notes (Signed)
SLP Cancellation Note  Patient Details Name: Henry [name] MRN: [id number] DOB: June 10, 1956   Cancelled treatment:        Plans for FEES today however has had decline in respiratory status. Pt tachypneic, sats 85-88% and on 40L non rebreather (from 35). MD in agreement pt is not appropriate for FEES presently and MD changing to NPO status. SLP plans to follow up next week for potential to return participate in FEES.    Houston Siren 12/16/2019, 1:19 PM  Orbie Pyo Colvin Caroli.Ed Risk analyst [phone] Office [phone]

## 2019-12-16 NOTE — Progress Notes (Signed)
Pharmacy Antibiotic Note  Henry [name] is a 63 y.o. male admitted on 12/22/19 with pneumonia.  Pharmacy has been consulted for Cefepime and Vancomycin dosing.  Plan: - Cefepime 2g IV q8h - Vanc 1500mg  IV x1 then 1g q12h  - Expected AUC 524 using rounded SCr 0.8 - Measure Vanc peak and trough at steady state. - Goal AUC = 400 - 550 - Follow up renal function, culture results, and clinical course.  Height: 5\' 5"  (165.1 cm) Weight: 158 lb 11.7 oz (72 kg) IBW/kg (Calculated) : 61.5  Temp (24hrs), Avg:98.5 F (36.9 C), Min:98.1 F (36.7 C), Max:98.6 F (37 C)  Recent Labs  Lab 12/12/19 0330 12/13/19 0448 12/14/19 0245 12/15/19 0132 12/16/19 0000  WBC 13.8* 11.0* 9.0 7.4 8.9  CREATININE 0.58* 0.59* 0.57* 0.62 0.61    Estimated Creatinine Clearance: 83.3 mL/min (by C-G formula based on SCr of 0.61 mg/dL).    Not on File  Thank you for allowing pharmacy to be a part of this patient's care.  Gretta Arab PharmD, BCPS Clinical pharmacist phone 7am- 5pm: [phone] 12/16/2019 4:02 PM

## 2019-12-16 NOTE — Progress Notes (Signed)
Pt arrived from PCU with Rapid team, MD used spanish interpreter [facility] [phone] for consent to intubate and treat.  See MAR for medications and times for intubation, intubated at 1513 by ICU MD and NP.

## 2019-12-16 NOTE — Procedures (Signed)
Central Venous Catheter Insertion Procedure Note Henry [name] [id number] 11/18/1956  Procedure: Insertion of Central Venous Catheter Indications: Assessment of intravascular volume, Drug and/or fluid administration and Frequent blood sampling  Procedure Details Consent: Unable to obtain consent because of emergent medical necessity. Time Out: Verified patient identification, verified procedure, site/side was marked, verified correct patient position, special equipment/implants available, medications/allergies/relevent history reviewed, required imaging and test results available.  Performed  Maximum sterile technique was used including antiseptics, cap, gloves, gown, hand hygiene, mask and sheet. Skin prep: Chlorhexidine; local anesthetic administered A antimicrobial bonded/coated triple lumen catheter was placed in the right internal jugular vein using the Seldinger technique.  Evaluation Blood flow good Complications: No apparent complications Patient did tolerate procedure well. Chest X-ray ordered to verify placement.  CXR: normal.  Henry [name] 12/16/2019, 4:30 PM Henry [name] ACNP-BC [place] Pager # [phone] OR # [phone] if no answer

## 2019-12-16 NOTE — Progress Notes (Signed)
Rapid Response Event Note  Overview: Patient having increased oxygen needs. RRT asked to assess.    Initial Focused Assessment: Patient labored and tachypneic. Unable to maintain oxygen saturation above 85% without significant work.  Interventions: Dr. Sherral Hammers paged to come to the bedside. Translator used to consent patient for intubation. Patient transported by RRT to the ICU. Dr. Lamonte Sakai and Marni Griffon NP assessed patient. Verbal consent for RSI and patient intubated.         Elspeth Cho

## 2019-12-16 NOTE — Progress Notes (Signed)
[place] Progress Note Patient Name: Henry [name] DOB: 03/02/1956 MRN: [id number]   Date of Service  12/16/2019  HPI/Events of Note  Nursing concerned about ETT position post proning.   eICU Interventions  Will order: 1. Portable CXR STAT.      Intervention Category Major Interventions: Respiratory failure - evaluation and management  Marielena Harvell Eugene 12/16/2019, 9:42 PM

## 2019-12-16 NOTE — Progress Notes (Signed)
PROGRESS NOTE    Henry [name]  ZOX:[id number] DOB: 05-Jan-1956 DOA: 12/05/19 PCP: Patient, No Pcp Per   Brief Narrative:  63 yo Hispanic male (Spanish-speaking) PMHx diabetes type 2 uncontrolled with complication, with T2DM presenting with AHRF 2/2 COVID 19 pneumonia   Subjective: 12/31 A/O x4, anxious when he attempts to speak (increased work of breathing/S OB)    Assessment & Plan:   Active Problems:   Acute respiratory failure with hypoxia (HCC)   COVID-19   Hyperglycemia   Acute hypoxemic respiratory failure (HCC)   Pneumonia due to COVID-19 virus   Elevated liver enzymes   Diabetes mellitus type 2, uncontrolled, with complications (HCC)  Covid pneumonia/acute respiratory failure with hypoxia COVID-19 Labs  Recent Labs    12/14/19 0245 12/15/19 0132 12/16/19 0000  DDIMER 3.78* 3.85* 3.44*  FERRITIN 258 250 237  CRP 0.7 1.2* 0.7    12/17 POC SARS coronavirus positive -Complete course of Remdesivir.  Last dose 12/21 -Completed 10-day course of steroids.  Last dose 10/26 -12/21 transfused 1 unit Covid convalescent plasma -12/21 Actemra x1 -Strict in and out -2.5 L -Daily weight Filed Weights   12/14/19 0508 12/15/19 0500 12/16/19 0500  Weight: 66 kg 72.1 kg 72 kg  -12/27 Lasix 60 mg x 1 -Lasix as needed -12/ 30 patient's oxygen requirements remain high not improving obtain CT chest to evaluate for additional pathology. -12/31 patient continue with increasing O2 demands initially appeared might stabilize when settings turned up on heated high flow however worsened, now intubated   Elevated liver enzymes Results for RIVAAN, KENDALL (MRN [id number]) as of 12/14/2019 15:45  Ref. Range 12/10/2019 08:30 12/11/2019 10:00 12/12/2019 03:30 12/13/2019 04:48 12/14/2019 02:45  Alkaline Phosphatase Latest Ref Range: 38 - 126 U/L 79 77 68 68 76  Albumin Latest Ref Range: 3.5 - 5.0 g/dL 2.8 (L) 2.8 (L) 2.6 (L) 2.5 (L) 2.6 (L)  AST Latest Ref Range: 15 - 41 U/L 51 (H) 64 (H)  46 (H) 48 (H) 55 (H)  ALT Latest Ref Range: 0 - 44 U/L 125 (H) 148 (H) 130 (H) 118 (H) 120 (H)  -LE stable but not WNL   Hypotension -Resolved  Diabetes type 2 uncontrolled with complication -12/17 hemoglobin A1c= 7.5 -Levemir 17 units BID -NovoLog 7 units TID -Resistant SSI -Tradjenta 5 mg daily while we given  Hypoglycemia -D5W 26ml/hr continue until patient able to resume meals after abdominal ultrasound  Dysphagia -N.p.o. until cleared by swallow evaluation -Continue tube feeds via core track -12/27 PCXR shows increased patchy opacification right lower lobe base see results below -12/29 patient speech evaluated patient.  Failed swallow study recommend n.p.o.    Aspiration pneumonia -Trend procalcitonin -We will start empiric antibiotics Results for COPPER, BASNETT (MRN [id number]) as of 12/13/2019 12:08  Ref. Range 12/12/2019 16:50 12/13/2019 04:48  Procalcitonin Latest Units: ng/mL <0.10 <0.10  -Complete 5-day course antibiotics -Nonspecific findings in liver on ultrasound.  Recommend patient follow-up with hepatologist on discharge       DVT prophylaxis: Lovenox Code Status: Full Family Communication: 12/31 spoke with Francis Dowse and wife informed them that patient has been intubated discussed plan of care answered all questions Disposition Plan: TBD   Consultants:  PCCM  Procedures/Significant Events:  12/17 Admitted to IMTS.  Started on steroids, remdesivir.   12/18 Placed on HHFNC overnight and transferred to ICU 12/20 Transferred to PCU at [facility] on heated high flow 12/21 Requiring 40 L HHFNC.  Received plasma and actemra. 12/27; PCXR-Stable asymmetric elevation right hemidiaphragm.  -  Slight progression of patchy airspace disease at the right base. 12/28 US abdomen RUQ;-mildly echogenic liver parenchyma, a nonspecific finding that can be seen with hepatic steatosis or hepatic fibrosis. Consider outpatient hepatic elastography for further liver  fibrosis risk stratification, as clinically warranted. 12/31 PCXR;-overall minimal interval change. -Fine diffuse airspace disease may be slightly increased. -Low lung volumes elevation LEFT hemidiaphragm  I have personally reviewed and interpreted all radiology studies and my findings are as above.  VENTILATOR SETTINGS: HFNC 12/31  flow rate; 35 L/min FiO2; 100% SPO2 93%    Cultures 12/17 POC SARS coronavirus positive    Antimicrobials: Anti-infectives (From admission, onward)   Start     Dose/Rate Stop   12/12/19 1700  Ampicillin-Sulbactam (UNASYN) 3 g in sodium chloride 0.9 % 100 mL IVPB  Status:  Discontinued     3 g 200 mL/hr over 30 Minutes 12/16/19 1552   12/03/19 1000  remdesivir 100 mg in sodium chloride 0.9 % 100 mL IVPB     100 mg 200 mL/hr over 30 Minutes 12/06/19 1103   12/18/2019 1315  remdesivir 200 mg in sodium chloride 0.9% 250 mL IVPB     200 mg 580 mL/hr over 30 Minutes 12/18/19 1648       Devices    LINES / TUBES:      Continuous Infusions: . sodium chloride Stopped (12/04/19 0939)  . ampicillin-sulbactam (UNASYN) IV 3 g (12/16/19 0452)  . feeding supplement (GLUCERNA 1.5 CAL) 65 mL/hr at 12/15/19 2000     Objective: Vitals:   12/16/19 0400 12/16/19 0500 12/16/19 0600 12/16/19 0758  BP: 105/63     Pulse: 77 82 74 77  Resp: (!) 41 (!) 25 (!) 28 (!) 25  Temp: 98.6 F (37 C)   98.6 F (37 C)  TempSrc: Oral   Oral  SpO2: 96% 93% 95% 93%  Weight:  72 kg    Height:        Intake/Output Summary (Last 24 hours) at 12/16/2019 0919 Last data filed at 12/16/2019 0400 Gross per 24 hour  Intake 1093.83 ml  Output 1200 ml  Net -106.17 ml   Filed Weights   12/14/19 0508 12/15/19 0500 12/16/19 0500  Weight: 66 kg 72.1 kg 72 kg    Physical Exam:  General: A/O x4, positive acute respiratory distress Eyes: negative scleral hemorrhage, negative anisocoria, negative icterus ENT: Negative Runny nose, negative gingival bleeding, Neck:   Negative scars, masses, torticollis, lymphadenopathy, JVD Lungs: Tachypneic decreased breath sounds RIGHT>> LEFT bilaterally without wheezes or crackles Cardiovascular: Tachycardic without murmur gallop or rub normal S1 and S2 Abdomen: negative abdominal pain, nondistended, positive soft, bowel sounds, no rebound, no ascites, no appreciable mass Extremities: No significant cyanosis, clubbing, or edema bilateral lower extremities Skin: Negative rashes, lesions, ulcers Psychiatric:  Negative depression, negative anxiety, negative fatigue, negative mania  Central nervous system:  Cranial nerves II through XII intact, tongue/uvula midline, all extremities muscle strength 5/5, sensation intact throughout, negative dysarthria, negative expressive aphasia, negative receptive aphasia.     Data Reviewed: Care during the described time interval was provided by me .  I have reviewed this patient's available data, including medical history, events of note, physical examination, and all test results as part of my evaluation.   CBC: Recent Labs  Lab 12/12/19 0330 12/13/19 0448 12/14/19 0245 12/15/19 0132 12/16/19 0000  WBC 13.8* 11.0* 9.0 7.4 8.9  NEUTROABS 12.1* 9.1* 7.7 6.1 7.2  HGB 16.0 15.9 16.0 15.2 16.0  HCT 48.5 49.4 48.9  47.8 50.1  MCV 88.7 89.7 89.9 91.0 91.9  PLT 223 190 182 157 150   Basic Metabolic Panel: Recent Labs  Lab 12/12/19 0330 12/13/19 0448 12/14/19 0245 12/15/19 0132 12/16/19 0000  NA 138 142 143 143 145  K 4.1 4.1 4.2 4.0 3.8  CL 102 104 109 111 111  CO2 27 28 26 24 23   GLUCOSE 158* 98 174* 127* 43*  BUN 31* 30* 31* 26* 21  CREATININE 0.58* 0.59* 0.57* 0.62 0.61  CALCIUM 8.3* 7.7* 8.1* 7.8* 7.9*  MG 2.2 2.3 2.3 2.3 2.2  PHOS 4.2 5.4* 4.2 3.9 3.2   GFR: Estimated Creatinine Clearance: 83.3 mL/min (by C-G formula based on SCr of 0.61 mg/dL). Liver Function Tests: Recent Labs  Lab 12/12/19 0330 12/13/19 0448 12/14/19 0245 12/15/19 0132 12/16/19 0000   AST 46* 48* 55* 51* 73*  ALT 130* 118* 120* 108* 142*  ALKPHOS 68 68 76 73 79  BILITOT 0.6 1.0 0.9 1.1 1.1  PROT 5.4* 5.2* 5.1* 5.0* 5.0*  ALBUMIN 2.6* 2.5* 2.6* 2.5* 2.6*   No results for input(s): LIPASE, AMYLASE in the last 168 hours. No results for input(s): AMMONIA in the last 168 hours. Coagulation Profile: No results for input(s): INR, PROTIME in the last 168 hours. Cardiac Enzymes: No results for input(s): CKTOTAL, CKMB, CKMBINDEX, TROPONINI in the last 168 hours. BNP (last 3 results) No results for input(s): PROBNP in the last 8760 hours. HbA1C: No results for input(s): HGBA1C in the last 72 hours. CBG: Recent Labs  Lab 12/15/19 1625 12/15/19 1942 12/15/19 2359 12/16/19 0428 12/16/19 0726  GLUCAP 176* 134* 78 89 95   Lipid Profile: No results for input(s): CHOL, HDL, LDLCALC, TRIG, CHOLHDL, LDLDIRECT in the last 72 hours. Thyroid Function Tests: No results for input(s): TSH, T4TOTAL, FREET4, T3FREE, THYROIDAB in the last 72 hours. Anemia Panel: Recent Labs    12/15/19 0132 12/16/19 0000  FERRITIN 250 237   Urine analysis:    Component Value Date/Time   COLORURINE YELLOW 08-27-2019 2341   APPEARANCEUR CLEAR 08-27-2019 2341   LABSPEC 1.009 08-27-2019 2341   PHURINE 7.0 08-27-2019 2341   GLUCOSEU 50 (A) 08-27-2019 2341   HGBUR NEGATIVE 08-27-2019 2341   BILIRUBINUR NEGATIVE 08-27-2019 2341   KETONESUR 5 (A) 08-27-2019 2341   PROTEINUR NEGATIVE 08-27-2019 2341   UROBILINOGEN 1.0 12/07/2007 1023   NITRITE NEGATIVE 08-27-2019 2341   LEUKOCYTESUR NEGATIVE 08-27-2019 2341   Sepsis Labs: @LABRCNTIP (procalcitonin:4,lacticidven:4)  ) No results found for this or any previous visit (from the past 240 hour(s)).       Radiology Studies: No results found.      Scheduled Meds: . Chlorhexidine Gluconate Cloth  6 each Topical Daily  . enoxaparin (LOVENOX) injection  40 mg Subcutaneous Q12H  . feeding supplement (ENSURE ENLIVE)  237 mL Oral TID BM  .  influenza vac split quadrivalent PF  0.5 mL Intramuscular Tomorrow-1000  . insulin aspart  0-20 Units Subcutaneous TID WC  . insulin aspart  0-5 Units Subcutaneous QHS  . insulin aspart  7 Units Subcutaneous TID WC  . insulin detemir  17 Units Subcutaneous BID  . linagliptin  5 mg Oral Daily  . mouth rinse  15 mL Mouth Rinse BID   Continuous Infusions: . sodium chloride Stopped (12/04/19 0939)  . ampicillin-sulbactam (UNASYN) IV 3 g (12/16/19 0452)  . feeding supplement (GLUCERNA 1.5 CAL) 65 mL/hr at 12/15/19 2000     LOS: 14 days   The patient is critically ill with  multiple organ systems failure and requires high complexity decision making for assessment and support, frequent evaluation and titration of therapies, application of advanced monitoring technologies and extensive interpretation of multiple databases. Critical Care Time devoted to patient care services described in this note  Time spent: 40 minutes     Kady Toothaker, Roselind Messier, MD [facility] Pager [phone]  If 7PM-7AM, please contact night-coverage [email or web address] Password [email or web address] 12/16/2019, 9:19 AM

## 2019-12-16 NOTE — Consult Note (Signed)
.   NAMEAvenir [name], MRN:  [id number], DOB:  1956/07/11, LOS: 59 ADMISSION DATE:  12/01/2019, CONSULTATION DATE:  12/31 REFERRING MD:  Sherral Hammers, CHIEF COMPLAINT:  Progressive respiratory failure    Brief History   [name] admitted on 12/17 with Covid pneumonia, transferred to the intensive care on 12/31 with acute decompensation  From time of admission to 12/31 Completed 5 days of remdesivir Completed 10 days of systemic steroids Transfused with convalescent plasma Received Actemra Never able to titrate off high flow oxygen Empiric antibiotics initiated on the 27th for presumptive aspiration  History of present illness   [name] admitted on 12/17 with 5 days of progressive worsening dyspnea and fatigue.  Generalized muscle ache and joint pain.  ER evaluation was consistent with worsening respiratory failure, new pulmonary infiltrates and positive COVID-19 he was admitted to the intensive care. Hospital course: Treated with high flow oxygen, remdesivir and systemic steroids initiated on hospital admission day 12/21 still hypoxic receiving Actemra and convalescent plasma 12/27 concern for possible aspiration Unasyn started 12/31 transfer to the intensive care for acute worsening work of breathing and hypoxia  Past Medical History  Type 2 diabetes poorly controlled Spanish-speaking only  Significant Hospital Events   12/17 admitted.  Started on remdesivir, and systemic steroids 12/20 transferred out of ICU to PCU 12/21 received 1 unit of convalescent plasma, Actemra x1; still needing high flow oxygen  12/27 Asymmetric airspace disease -->presumptive aspiration event. Unasyn started.  12/31: Progressive worsening of work of breathing, saturations in 70s with respiratory rate in the 40s still on high flow oxygen at 40 L transferred emergently to intensive care intubated initial plateau pressure 52   Consults:  Critical care  consulted 12/31 Speech-language pathology  Procedures:  Oral endotracheal tube 12/31>>> Right IJ triple-lumen catheter 12/31>>> Radial A-line 12/31>>  Significant Diagnostic Tests:    Micro Data:  12/17 point of care coronavirus positive  resp culture 12/31>>>  Antimicrobials:  unasyn 12/27>>12/31 Cefepime 12/31>>> vanc 12/31>>>  Remdesivir from 12/27 through 12/31 (completed 5 days) Interim history/subjective:  Now sedated and intubated Objective   Blood pressure (Abnormal) 123/104, pulse 100, temperature 98.1 F (36.7 C), temperature source Oral, resp. rate (Abnormal) 25, height 5\' 5"  (1.651 m), weight 72 kg, SpO2 (Abnormal) 86 %.    FiO2 (%):  [92 %-100 %] 100 %   Intake/Output Summary (Last 24 hours) at 12/16/2019 1632 Last data filed at 12/16/2019 1316 Gross per 24 hour  Intake 1223.83 ml  Output 1450 ml  Net -226.17 ml   Filed Weights   12/14/19 0508 12/15/19 0500 12/16/19 0500  Weight: 66 kg 72.1 kg 72 kg    Examination: General: 63 year old Hispanic male currently sedated and paralyzed on mechanical ventilator had been oriented prior to intubation we spoke via interpreter HENT: Normocephalic atraumatic orally intubated no JVD, neck veins are flat Lungs: Diminished throughout marked accessory use Ventilator settings: Currently on 6 mL/kg predicted body weight, plateau pressures at 52, driving pressure 38.  PEEP currently set at 14 and FiO2 100% Cardiovascular: Regular rate and rhythm without murmur rub or gallop Abdomen: Soft nontender Extremities: No edema brisk cap refill Neuro: Now sedated GU: Due to void  Resolved Hospital Problem list     Assessment & Plan:  Acute hypoxic respiratory failure secondary to Covid pneumonia with ARDS, further complicated by HCAP versus aspiration event -Ongoing concern for dysphagia documented Portable chest x-ray personally reviewed demonstrates endotracheal tube about 1 cm above the carina this needs  to be pulled  back 2 cm.  There is marked right greater than left airspace disease this looks progressive since prior film -Has completed systemic steroids and remdesivir Plan Low tidal volume ventilation, currently on 6 mL/kg predicted body weight, has significant respiratory acidosis however ventilator mechanics are preventing up titration of minute ventilation therefore we will initiate bicarbonate infusion and accept permissive hypercarbia shooting for ph >7.2 Plateau pressure goal less than 30, driving pressure goal less than 15, we are initiating neuromuscular blockade and prone position Prone position protocol, maintaining prone position x16 hours while P/F ratio less than 150 PaO2 target 55 to 60 CVP goal less than 4, reassess for diuresis Daily Sputum culture Empiric nosocomial coverage with cefepime and vancomycin PAD protocol, RASS goal -4 starting Versed and fentanyl infusions Echo  Diabetes type 2 With hyperglycemia Plan Sliding scale insulin  At risk for fluid and electrolyte balance Plan A.m. chemistry  Protein calorie malnutrition in setting of prolonged critical illness Plan Tube feeds  Best practice:  Diet: Tube feeds Pain/Anxiety/Delirium protocol (if indicated): RASS goal negative for 12/31 initiating Versed and fentanyl drips, also neuromuscular blockade VAP protocol (if indicated): 12/31 DVT prophylaxis: Low molecular weight heparin GI prophylaxis: PPI Glucose control: Sliding scale insulin Mobility: Bedrest Code Status: Full code Family Communication: Family contacted by Dr. Joseph Art Disposition: Critical care ICU  Labs   CBC: Recent Labs  Lab 12/12/19 0330 12/13/19 0448 12/14/19 0245 12/15/19 0132 12/16/19 0000  WBC 13.8* 11.0* 9.0 7.4 8.9  NEUTROABS 12.1* 9.1* 7.7 6.1 7.2  HGB 16.0 15.9 16.0 15.2 16.0  HCT 48.5 49.4 48.9 47.8 50.1  MCV 88.7 89.7 89.9 91.0 91.9  PLT 223 190 182 157 150    Basic Metabolic Panel: Recent Labs  Lab 12/12/19 0330  12/13/19 0448 12/14/19 0245 12/15/19 0132 12/16/19 0000  NA 138 142 143 143 145  K 4.1 4.1 4.2 4.0 3.8  CL 102 104 109 111 111  CO2 27 28 26 24 23   GLUCOSE 158* 98 174* 127* 43*  BUN 31* 30* 31* 26* 21  CREATININE 0.58* 0.59* 0.57* 0.62 0.61  CALCIUM 8.3* 7.7* 8.1* 7.8* 7.9*  MG 2.2 2.3 2.3 2.3 2.2  PHOS 4.2 5.4* 4.2 3.9 3.2   GFR: Estimated Creatinine Clearance: 83.3 mL/min (by C-G formula based on SCr of 0.61 mg/dL). Recent Labs  Lab 12/12/19 1650 12/13/19 0448 12/14/19 0245 12/15/19 0132 12/16/19 0000  PROCALCITON <0.10 <0.10 <0.10  --   --   WBC  --  11.0* 9.0 7.4 8.9    Liver Function Tests: Recent Labs  Lab 12/12/19 0330 12/13/19 0448 12/14/19 0245 12/15/19 0132 12/16/19 0000  AST 46* 48* 55* 51* 73*  ALT 130* 118* 120* 108* 142*  ALKPHOS 68 68 76 73 79  BILITOT 0.6 1.0 0.9 1.1 1.1  PROT 5.4* 5.2* 5.1* 5.0* 5.0*  ALBUMIN 2.6* 2.5* 2.6* 2.5* 2.6*   No results for input(s): LIPASE, AMYLASE in the last 168 hours. No results for input(s): AMMONIA in the last 168 hours.  ABG    Component Value Date/Time   PHART 7.507 (H) 12/03/2019 1008   PCO2ART 31.3 (L) 12/03/2019 1008   PO2ART 52.0 (L) 12/03/2019 1008   HCO3 24.9 12/03/2019 1008   TCO2 26 12/03/2019 1008   ACIDBASEDEF 12.0 (H) 12/07/2007 0955   O2SAT 91.0 12/03/2019 1008     Coagulation Profile: No results for input(s): INR, PROTIME in the last 168 hours.  Cardiac Enzymes: No results for input(s): CKTOTAL, CKMB, CKMBINDEX,  TROPONINI in the last 168 hours.  HbA1C: Hgb A1c MFr Bld  Date/Time Value Ref Range Status  11/20/2019 07:13 PM 7.5 (H) 4.8 - 5.6 % Final    Comment:    (NOTE) Pre diabetes:          5.7%-6.4% Diabetes:              >6.4% Glycemic control for   <7.0% adults with diabetes     CBG: Recent Labs  Lab 12/15/19 2359 12/16/19 0428 12/16/19 0614 12/16/19 0726 12/16/19 1144  GLUCAP 78 89 102* 95 183*    Review of Systems:   Not able   Past Medical History  He,   has no past medical history on file.   Surgical History   History reviewed. No pertinent surgical history.   Social History   reports that he has never smoked. He has never used smokeless tobacco. He reports previous alcohol use. He reports that he does not use drugs.   Family History   His family history is not on file.   Allergies Not on File   Home Medications  Prior to Admission medications   Not on File     Critical care time: 45 minutes.     Simonne MartinetPeter E Manasvini Whatley ACNP-BC [facility] Pager # [phone] OR # [phone] if no answer

## 2019-12-16 NOTE — Procedures (Signed)
Intubation Procedure Note Henry [name] [id number] October 21, 1956  Procedure: Intubation Indications: Respiratory insufficiency  Procedure Details Consent: Unable to obtain consent because of emergent medical necessity. Time Out: Verified patient identification, verified procedure, site/side was marked, verified correct patient position, special equipment/implants available, medications/allergies/relevent history reviewed, required imaging and test results available.  Performed  Maximum sterile technique was used including antiseptics, cap, gloves, gown, hand hygiene and mask.  MAC and 4    Evaluation Hemodynamic Status: BP stable throughout; O2 sats: transiently fell during during procedure Patient's Current Condition: critically ill  Complications: No apparent complications Patient did tolerate procedure well. Chest X-ray ordered to verify placement.  CXR: tube position acceptable.did have RT pull back 2 cm.   [name] 12/16/2019

## 2019-12-16 NOTE — Procedures (Signed)
Arterial Catheter Insertion Procedure Note Yassir Enis [id number] [birth year]  Procedure: Insertion of Arterial Catheter  Indications: Blood pressure monitoring and Frequent blood sampling  Procedure Details Consent: Risks of procedure as well as the alternatives and risks of each were explained to the (patient/caregiver).  Consent for procedure obtained. Time Out: Verified patient identification, verified procedure, site/side was marked, verified correct patient position, special equipment/implants available, medications/allergies/relevent history reviewed, required imaging and test results available.  Performed  Maximum sterile technique was used including antiseptics, cap, gloves, gown and hand hygiene. Skin prep: Chlorhexidine; local anesthetic administered 20 gauge catheter was inserted into right radial artery using the Seldinger technique. ULTRASOUND GUIDANCE USED: NO Evaluation Blood flow good; BP tracing good. Complications: No apparent complications.   Phillis Knack [facility] 12/16/2019

## 2019-12-17 ENCOUNTER — Inpatient Hospital Stay (HOSPITAL_COMMUNITY): Payer: HRSA Program

## 2019-12-17 DIAGNOSIS — J80 Acute respiratory distress syndrome: Secondary | ICD-10-CM

## 2019-12-17 DIAGNOSIS — I361 Nonrheumatic tricuspid (valve) insufficiency: Secondary | ICD-10-CM

## 2019-12-17 LAB — CBC WITH DIFFERENTIAL/PLATELET
Abs Immature Granulocytes: 0.06 10*3/uL (ref 0.00–0.07)
Basophils Absolute: 0 10*3/uL (ref 0.0–0.1)
Basophils Relative: 0 %
Eosinophils Absolute: 0.2 10*3/uL (ref 0.0–0.5)
Eosinophils Relative: 1 %
HCT: 48.5 % (ref 39.0–52.0)
Hemoglobin: 14.8 g/dL (ref 13.0–17.0)
Immature Granulocytes: 1 %
Lymphocytes Relative: 7 %
Lymphs Abs: 0.7 10*3/uL (ref 0.7–4.0)
MCH: 29.2 pg (ref 26.0–34.0)
MCHC: 30.5 g/dL (ref 30.0–36.0)
MCV: 95.7 fL (ref 80.0–100.0)
Monocytes Absolute: 0.2 10*3/uL (ref 0.1–1.0)
Monocytes Relative: 2 %
Neutro Abs: 9.2 10*3/uL — ABNORMAL HIGH (ref 1.7–7.7)
Neutrophils Relative %: 89 %
Platelets: 125 10*3/uL — ABNORMAL LOW (ref 150–400)
RBC: 5.07 MIL/uL (ref 4.22–5.81)
RDW: 15.4 % (ref 11.5–15.5)
WBC: 10.4 10*3/uL (ref 4.0–10.5)
nRBC: 0.2 % (ref 0.0–0.2)

## 2019-12-17 LAB — BASIC METABOLIC PANEL
Anion gap: 9 (ref 5–15)
Anion gap: 9 (ref 5–15)
BUN: 18 mg/dL (ref 8–23)
BUN: 15 mg/dL (ref 8–23)
CO2: 33 mmol/L — ABNORMAL HIGH (ref 22–32)
CO2: 36 mmol/L — ABNORMAL HIGH (ref 22–32)
Calcium: 7.1 mg/dL — ABNORMAL LOW (ref 8.9–10.3)
Calcium: 6.8 mg/dL — ABNORMAL LOW (ref 8.9–10.3)
Chloride: 105 mmol/L (ref 98–111)
Chloride: 102 mmol/L (ref 98–111)
Creatinine, Ser: 0.59 mg/dL — ABNORMAL LOW (ref 0.61–1.24)
Creatinine, Ser: 0.49 mg/dL — ABNORMAL LOW (ref 0.61–1.24)
GFR calc Af Amer: >60 mL/min (ref >60)
GFR calc Af Amer: >60 mL/min (ref >60)
GFR calc non Af Amer: >60 mL/min (ref >60)
GFR calc non Af Amer: >60 mL/min (ref >60)
Glucose, Bld: 144 mg/dL — ABNORMAL HIGH (ref 70–99)
Glucose, Bld: 182 mg/dL — ABNORMAL HIGH (ref 70–99)
Potassium: 3.9 mmol/L (ref 3.5–5.1)
Potassium: 3.6 mmol/L (ref 3.5–5.1)
Sodium: 147 mmol/L — ABNORMAL HIGH (ref 135–145)
Sodium: 147 mmol/L — ABNORMAL HIGH (ref 135–145)

## 2019-12-17 LAB — ECHOCARDIOGRAM COMPLETE
Height: 65 in
Weight: 2620.83 oz

## 2019-12-17 LAB — POCT I-STAT 7, (LYTES, BLD GAS, ICA,H+H)
Acid-Base Excess: 6 mmol/L — ABNORMAL HIGH (ref 0.0–2.0)
Acid-Base Excess: 9 mmol/L — ABNORMAL HIGH (ref 0.0–2.0)
Bicarbonate: 36.1 mmol/L — ABNORMAL HIGH (ref 20.0–28.0)
Bicarbonate: 41.3 mmol/L — ABNORMAL HIGH (ref 20.0–28.0)
Calcium, Ion: 1.04 mmol/L — ABNORMAL LOW (ref 1.15–1.40)
Calcium, Ion: 1.02 mmol/L — ABNORMAL LOW (ref 1.15–1.40)
HCT: 47 % (ref 39.0–52.0)
HCT: 49 % (ref 39.0–52.0)
Hemoglobin: 16 g/dL (ref 13.0–17.0)
Hemoglobin: 16.7 g/dL (ref 13.0–17.0)
O2 Saturation: 87 %
O2 Saturation: 93 %
Potassium: 4 mmol/L (ref 3.5–5.1)
Potassium: 4 mmol/L (ref 3.5–5.1)
Sodium: 149 mmol/L — ABNORMAL HIGH (ref 135–145)
Sodium: 147 mmol/L — ABNORMAL HIGH (ref 135–145)
TCO2: 38 mmol/L — ABNORMAL HIGH (ref 22–32)
TCO2: 44 mmol/L — ABNORMAL HIGH (ref 22–32)
pCO2 arterial: 73.7 mmHg (ref 32.0–48.0)
pCO2 arterial: 92.9 mmHg (ref 32.0–48.0)
pH, Arterial: 7.298 — ABNORMAL LOW (ref 7.350–7.450)
pH, Arterial: 7.256 — ABNORMAL LOW (ref 7.350–7.450)
pO2, Arterial: 61 mmHg — ABNORMAL LOW (ref 83.0–108.0)
pO2, Arterial: 83 mmHg (ref 83.0–108.0)

## 2019-12-17 LAB — COMPREHENSIVE METABOLIC PANEL
ALT: 113 U/L — ABNORMAL HIGH (ref 0–44)
AST: 45 U/L — ABNORMAL HIGH (ref 15–41)
Albumin: 2.5 g/dL — ABNORMAL LOW (ref 3.5–5.0)
Alkaline Phosphatase: 77 U/L (ref 38–126)
Anion gap: 9 (ref 5–15)
BUN: 18 mg/dL (ref 8–23)
CO2: 33 mmol/L — ABNORMAL HIGH (ref 22–32)
Calcium: 7.1 mg/dL — ABNORMAL LOW (ref 8.9–10.3)
Chloride: 106 mmol/L (ref 98–111)
Creatinine, Ser: 0.63 mg/dL (ref 0.61–1.24)
GFR calc Af Amer: >60 mL/min (ref >60)
GFR calc non Af Amer: >60 mL/min (ref >60)
Glucose, Bld: 145 mg/dL — ABNORMAL HIGH (ref 70–99)
Potassium: 3.9 mmol/L (ref 3.5–5.1)
Sodium: 148 mmol/L — ABNORMAL HIGH (ref 135–145)
Total Bilirubin: 1.3 mg/dL — ABNORMAL HIGH (ref 0.3–1.2)
Total Protein: 4.5 g/dL — ABNORMAL LOW (ref 6.5–8.1)

## 2019-12-17 LAB — GLUCOSE, CAPILLARY
Glucose-Capillary: 139 mg/dL — ABNORMAL HIGH (ref 70–99)
Glucose-Capillary: 166 mg/dL — ABNORMAL HIGH (ref 70–99)
Glucose-Capillary: 97 mg/dL (ref 70–99)

## 2019-12-17 LAB — MAGNESIUM: Magnesium: 2.3 mg/dL (ref 1.7–2.4)

## 2019-12-17 LAB — C-REACTIVE PROTEIN: CRP: 0.5 mg/dL (ref <1.0)

## 2019-12-17 LAB — FERRITIN: Ferritin: 190 ng/mL (ref 24–336)

## 2019-12-17 LAB — PHOSPHORUS: Phosphorus: 3.9 mg/dL (ref 2.5–4.6)

## 2019-12-17 MED ORDER — SODIUM CHLORIDE 0.9 % IV SOLN
0.0000 ug/kg/min | INTRAVENOUS | Status: DC
  Administered 2019-12-17: 3 ug/kg/min via INTRAVENOUS
  Administered 2019-12-18 – 2019-12-20 (×5): 3.5 ug/kg/min via INTRAVENOUS
  Filled 2019-12-17 (×8): qty 20

## 2019-12-17 MED ORDER — MIDAZOLAM BOLUS VIA INFUSION
1.0000 mg | INTRAVENOUS | Status: DC | PRN
  Administered 2019-12-26 – 2020-01-02 (×7): 2 mg via INTRAVENOUS
  Filled 2019-12-17: qty 2

## 2019-12-17 MED ORDER — SODIUM BICARBONATE-DEXTROSE 150-5 MEQ/L-% IV SOLN
150.0000 meq | INTRAVENOUS | Status: DC
  Administered 2019-12-17: 21:00:00 150 meq via INTRAVENOUS
  Filled 2019-12-17 (×3): qty 1000

## 2019-12-17 MED ORDER — FENTANYL CITRATE (PF) 100 MCG/2ML IJ SOLN
50.0000 ug | Freq: Once | INTRAMUSCULAR | Status: AC
  Administered 2019-12-17: 50 ug via INTRAVENOUS

## 2019-12-17 MED ORDER — FENTANYL 2500MCG IN NS 250ML (10MCG/ML) PREMIX INFUSION
50.0000 ug/h | INTRAVENOUS | Status: DC
  Administered 2019-12-17 (×2): 200 ug/h via INTRAVENOUS
  Administered 2019-12-18 – 2019-12-20 (×6): 250 ug/h via INTRAVENOUS
  Filled 2019-12-17 (×7): qty 250

## 2019-12-17 MED ORDER — MIDAZOLAM 50MG/50ML (1MG/ML) PREMIX INFUSION
2.0000 mg/h | INTRAVENOUS | Status: DC
  Administered 2019-12-17: 8 mg/h via INTRAVENOUS
  Administered 2019-12-17 (×2): 10 mg/h via INTRAVENOUS
  Administered 2019-12-17: 8 mg/h via INTRAVENOUS
  Administered 2019-12-18 – 2019-12-28 (×45): 10 mg/h via INTRAVENOUS
  Administered 2019-12-28 – 2019-12-29 (×5): 9 mg/h via INTRAVENOUS
  Administered 2019-12-29: 18:00:00 8 mg/h via INTRAVENOUS
  Administered 2019-12-30 – 2019-12-31 (×5): 9 mg/h via INTRAVENOUS
  Administered 2019-12-31: 17:00:00 10 mg/h via INTRAVENOUS
  Administered 2019-12-31: 05:00:00 9 mg/h via INTRAVENOUS
  Administered 2019-12-31 – 2020-01-04 (×15): 10 mg/h via INTRAVENOUS
  Filled 2019-12-17 (×79): qty 50

## 2019-12-17 MED ORDER — FUROSEMIDE 10 MG/ML IJ SOLN
40.0000 mg | Freq: Two times a day (BID) | INTRAMUSCULAR | Status: AC
  Administered 2019-12-17 (×2): 40 mg via INTRAVENOUS
  Filled 2019-12-17 (×2): qty 4

## 2019-12-17 MED ORDER — DEXTROSE 50 % IV SOLN
INTRAVENOUS | Status: AC
  Administered 2019-12-17: 50 mL
  Filled 2019-12-17: qty 50

## 2019-12-17 MED ORDER — NOREPINEPHRINE 4 MG/250ML-% IV SOLN
0.0000 ug/min | INTRAVENOUS | Status: DC
  Administered 2019-12-17 (×2): 2 ug/min via INTRAVENOUS
  Administered 2019-12-17: 14 ug/min via INTRAVENOUS
  Administered 2019-12-17: 8 ug/min via INTRAVENOUS
  Administered 2019-12-17 – 2019-12-18 (×2): 15 ug/min via INTRAVENOUS
  Administered 2019-12-18: 8 ug/min via INTRAVENOUS
  Administered 2019-12-18: 9 ug/min via INTRAVENOUS
  Administered 2019-12-19 (×2): 8 ug/min via INTRAVENOUS
  Administered 2019-12-19 – 2019-12-20 (×2): 9 ug/min via INTRAVENOUS
  Filled 2019-12-17 (×11): qty 250

## 2019-12-17 MED ORDER — FENTANYL BOLUS VIA INFUSION
50.0000 ug | INTRAVENOUS | Status: DC | PRN
  Administered 2019-12-17: 12:00:00 50 ug via INTRAVENOUS
  Filled 2019-12-17: qty 50

## 2019-12-17 MED ORDER — ARTIFICIAL TEARS OPHTHALMIC OINT
1.0000 "application " | TOPICAL_OINTMENT | Freq: Three times a day (TID) | OPHTHALMIC | Status: DC
  Administered 2019-12-17 – 2019-12-21 (×12): 1 via OPHTHALMIC
  Filled 2019-12-17 (×2): qty 3.5

## 2019-12-17 MED ORDER — CISATRACURIUM BOLUS VIA INFUSION
5.0000 mg | Freq: Once | INTRAVENOUS | Status: AC
  Administered 2019-12-17: 14:00:00 5 mg via INTRAVENOUS
  Filled 2019-12-17: qty 5

## 2019-12-17 MED ORDER — SODIUM BICARBONATE 8.4 % IV SOLN
100.0000 meq | Freq: Once | INTRAVENOUS | Status: AC
  Administered 2019-12-17: 02:00:00 100 meq via INTRAVENOUS
  Filled 2019-12-17: qty 100

## 2019-12-17 NOTE — Progress Notes (Signed)
RT NOTE:  When RT arrived to turn patients head it was noted that he had audible cuff leak and low VT on vent. RT attempted to add air to cuff without change to leak. At that time, the decision was made to attempt to retape ETT to assess location with help of RT X2 & RN X3. Cuff leak still audible after re-taping at last charted location on 24cm. RT and charge RN decided to supine patient and get chest XRAY for tube placement. RT was able to visualize ETT kinked in patient mouth.   Once supine ETT no longer kinked, cuff leak no longer audible and no volume loss.   XRAY done and ETT was advanced and secured @ 26cm per [facility].    ETT taped with cloth tape and patient was proned @ 2230.   No further complication

## 2019-12-17 NOTE — Progress Notes (Signed)
Recruitment maneuver performed per CCM MD request. Pt tolerated well. Vent changes made per CCM MD. RT to obtain ABG later today. RT will continue to monitor.

## 2019-12-17 NOTE — Progress Notes (Signed)
Echocardiogram 2D Echocardiogram has been performed.  Henry [name] 12/17/2019, 11:40 AM

## 2019-12-17 NOTE — Progress Notes (Signed)
Pt's head turned to the right with no complications.RT and 2 RN assisted. ETT secured at 26 with cloth tape

## 2019-12-17 NOTE — Progress Notes (Signed)
.   NAMEDaniela [name], MRN:  [id number], DOB:  03/08/1956, LOS: 15 ADMISSION DATE:  01-01-20, CONSULTATION DATE:  12/31 REFERRING MD:  Joseph Art, CHIEF COMPLAINT:  Progressive respiratory failure    Brief History   64 year old non-English-speaking male admitted on 12/17 with Covid pneumonia, transferred to the intensive care on 12/31 with acute decompensation  From time of admission to 12/31 Completed 5 days of remdesivir Completed 10 days of systemic steroids Transfused with convalescent plasma Received Actemra Never able to titrate off high flow oxygen Empiric antibiotics initiated on the 27th for presumptive aspiration  History of present illness   64 year old male non-English-speaking admitted on 12/17 with 5 days of progressive worsening dyspnea and fatigue.  Generalized muscle ache and joint pain.  ER evaluation was consistent with worsening respiratory failure, new pulmonary infiltrates and positive COVID-19 he was admitted to the intensive care. Hospital course: Treated with high flow oxygen, remdesivir and systemic steroids initiated on hospital admission day 12/21 still hypoxic receiving Actemra and convalescent plasma 12/27 concern for possible aspiration Unasyn started 12/31 transfer to the intensive care for acute worsening work of breathing and hypoxia  Past Medical History  Type 2 diabetes poorly controlled Spanish-speaking only  Significant Hospital Events   12/17 admitted.  Started on remdesivir, and systemic steroids 12/20 transferred out of ICU to PCU 12/21 received 1 unit of convalescent plasma, Actemra x1; still needing high flow oxygen  12/27 Asymmetric airspace disease -->presumptive aspiration event. Unasyn started.  12/31: Progressive worsening of work of breathing, saturations in 70s with respiratory rate in the 40s still on high flow oxygen at 40 L transferred emergently to intensive care intubated initial plateau pressure 52   Consults:  Critical care  consulted 12/31 Speech-language pathology  Procedures:  Oral endotracheal tube 12/31>>> Right IJ triple-lumen catheter 12/31>>> Radial A-line 12/31>>  Significant Diagnostic Tests:    Micro Data:  12/17 point of care coronavirus positive  resp culture 12/31>>>  Antimicrobials:  unasyn 12/27>>12/31 Cefepime 12/31>>> vanc 12/31>>>  Remdesivir from 12/27 through 12/31 (completed 5 days) Interim history/subjective:  Urgently intubated 12/21 for evolving respiratory failure Continued respiratory acidosis overnight, sodium bicarbonate infusion initiated Currently on FiO2 0.90, PEEP 12 Hypotension overnight norepinephrine started, on 14 Decreased pao2 while proned o/n I/O -900 cc  Objective   Blood pressure 111/73, pulse 89, temperature 99 F (37.2 C), temperature source Oral, resp. rate (!) 32, height 5\' 5"  (1.651 m), weight 74.3 kg, SpO2 100 %.    Vent Mode: PRVC FiO2 (%):  [90 %-100 %] 90 % Set Rate:  [32 bmp] 32 bmp Vt Set:  [370 mL] 370 mL PEEP:  [12 cmH20-16 cmH20] 12 cmH20 Plateau Pressure:  [30 cmH20-45 cmH20] 40 cmH20   Intake/Output Summary (Last 24 hours) at 12/17/2019 02/14/2020 Last data filed at 12/17/2019 0600 Gross per 24 hour  Intake 2531.09 ml  Output 1065 ml  Net 1466.09 ml   Filed Weights   12/15/19 0500 12/16/19 0500 12/17/19 0500  Weight: 72.1 kg 72 kg 74.3 kg    Examination: General: 64 year old Hispanic male currently sedated and paralyzed on mechanical ventilator HENT: ETT in place, no oral lesions, pupils equal Lungs: coarse B BS, no wheeze Cardiovascular: regular, distant, no M Abdomen: Soft non-distended, hypoactive BS Extremities: No edema Neuro: deep sedation, RASS -4, unresponsive to pain  Resolved Hospital Problem list     Assessment & Plan:  Acute hypoxic respiratory failure secondary to Covid pneumonia with ARDS, further complicated by HCAP versus aspiration event -Ongoing  concern for dysphagia documented Portable chest x-ray 1/1  reviewed by me, bilateral interstitial infiltrates, right greater than left with an elevated right hemidiaphragm and right basilar infiltrate -Has completed systemic steroids and remdesivir Plan Continue low tidal volume ventilation.  We will try to increase respiratory rate to 35 and follow pH.  Goal will be to come off bicarbonate drip, may need to increase his VT to 7 cc/kg depending on plateau pressures (goal less than 30) driving pressure (goal less than 15) Uptitrate PEEP on 1/1, goal wean FiO2 Hold off further proning given his desat 12/31 o/n Bicarb gtt o/n, plan decrease to 50cc/h Diuresis daily as blood pressure and renal function will tolerate for goal CVP 5-8 Continue empiric coverage for possible H CAP, cefepime, vancomycin Deep sedation per protocol, RASS -4 and paralytics at this time Echocardiogram  Shock, multifactorial.  Suspect significant contribution of sedating medications and his acidosis.  Consider also septic shock due to H CAP Plan Support blood pressure with norepinephrine, wean as able.  MAP goal > 65  Diabetes type 2 With hyperglycemia Plan Sliding-scale insulin as ordered  At risk for fluid and electrolyte balance Plan Follow urine output, BMP  Protein calorie malnutrition in setting of prolonged critical illness Plan Tube feeding support  Best practice:  Diet: Tube feeds Pain/Anxiety/Delirium protocol (if indicated): RASS goal negative for 12/31 initiating Versed and fentanyl drips, also neuromuscular blockade VAP protocol (if indicated): 12/31 DVT prophylaxis: Low molecular weight heparin GI prophylaxis: PPI Glucose control: Sliding scale insulin Mobility: Bedrest Code Status: Full code Family Communication: Will attempt to contact Disposition: Critical care ICU  Labs   CBC: Recent Labs  Lab 12/13/19 0448 12/14/19 0245 12/15/19 0132 12/16/19 0000 12/16/19 2213 12/17/19 0422 12/17/19 0500  WBC 11.0* 9.0 7.4 8.9  --   --  10.4    NEUTROABS 9.1* 7.7 6.1 7.2  --   --  9.2*  HGB 15.9 16.0 15.2 16.0 17.7* 16.0 14.8  HCT 49.4 48.9 47.8 50.1 52.0 47.0 48.5  MCV 89.7 89.9 91.0 91.9  --   --  95.7  PLT 190 182 157 150  --   --  125*    Basic Metabolic Panel: Recent Labs  Lab 12/13/19 0448 12/14/19 0245 12/15/19 0132 12/16/19 0000 12/16/19 2200 12/16/19 2213 12/17/19 0422 12/17/19 0500 12/17/19 0545  NA 142 143 143 145 145 146* 149* 148* 147*  K 4.1 4.2 4.0 3.8 5.2* 5.1 4.0 3.9 3.9  CL 104 109 111 111 110  --   --  106 105  CO2 28 26 24 23 30   --   --  33* 33*  GLUCOSE 98 174* 127* 43* 154*  --   --  145* 144*  BUN 30* 31* 26* 21 22  --   --  18 18  CREATININE 0.59* 0.57* 0.62 0.61 0.74  --   --  0.63 0.59*  CALCIUM 7.7* 8.1* 7.8* 7.9* 7.5*  --   --  7.1* 7.1*  MG 2.3 2.3 2.3 2.2  --   --   --  2.3  --   PHOS 5.4* 4.2 3.9 3.2  --   --   --  3.9  --    GFR: Estimated Creatinine Clearance: 90.2 mL/min (A) (by C-G formula based on SCr of 0.59 mg/dL (L)). Recent Labs  Lab 12/12/19 1650 12/13/19 0448 12/14/19 0245 12/15/19 0132 12/16/19 0000 12/17/19 0500  PROCALCITON <0.10 <0.10 <0.10  --   --   --   WBC  --  11.0* 9.0 7.4 8.9 10.4    Liver Function Tests: Recent Labs  Lab 12/13/19 0448 12/14/19 0245 12/15/19 0132 12/16/19 0000 12/17/19 0500  AST 48* 55* 51* 73* 45*  ALT 118* 120* 108* 142* 113*  ALKPHOS 68 76 73 79 77  BILITOT 1.0 0.9 1.1 1.1 1.3*  PROT 5.2* 5.1* 5.0* 5.0* 4.5*  ALBUMIN 2.5* 2.6* 2.5* 2.6* 2.5*   No results for input(s): LIPASE, AMYLASE in the last 168 hours. No results for input(s): AMMONIA in the last 168 hours.  ABG    Component Value Date/Time   PHART 7.298 (L) 12/17/2019 0422   PCO2ART 73.7 (HH) 12/17/2019 0422   PO2ART 61.0 (L) 12/17/2019 0422   HCO3 36.1 (H) 12/17/2019 0422   TCO2 38 (H) 12/17/2019 0422   ACIDBASEDEF 4.0 (H) 12/16/2019 2213   O2SAT 87.0 12/17/2019 0422     Coagulation Profile: No results for input(s): INR, PROTIME in the last 168  hours.  Cardiac Enzymes: No results for input(s): CKTOTAL, CKMB, CKMBINDEX, TROPONINI in the last 168 hours.  HbA1C: Hgb A1c MFr Bld  Date/Time Value Ref Range Status  11/27/2019 07:13 PM 7.5 (H) 4.8 - 5.6 % Final    Comment:    (NOTE) Pre diabetes:          5.7%-6.4% Diabetes:              >6.4% Glycemic control for   <7.0% adults with diabetes     CBG: Recent Labs  Lab 12/16/19 1144 12/16/19 1741 12/16/19 2019 12/16/19 2357 12/17/19 0500  GLUCAP 183* 185* 186* 97 166*     Critical care time: 34 minutes.      Baltazar Apo, MD, PhD 12/17/2019, 11:25 AM [facility] [phone] or if no answer [phone]

## 2019-12-17 NOTE — Progress Notes (Signed)
ABG results given to Dr. Delton Coombes. Verbal order received to increase RR to 35 and place pt on 7cc/kg. RT will continue to monitor.

## 2019-12-17 NOTE — Progress Notes (Signed)
Pt's head turned to the left with no complications. ETT secured at 26 at the lips with cloth tape

## 2019-12-17 NOTE — Progress Notes (Addendum)
eLink Physician-Brief Progress Note Patient Name: Henry [name] DOB: 08/10/1956 MRN: [id number]   Date of Service  12/17/2019  HPI/Events of Note  Multiple issues: 1. Hypotension - BP = 67/46 with MAP = 53 and 2. ABG on 90%/PRVC 32/TV 370/P 12 = 7.102/94.2/90.0.  eICU Interventions  Will order: 1. Norepinephrine IV infusion. Titrate to MAP >= 65. 2. NaHCO3 100 meq IV now.  3. Increase D5 NaHCO3 IV infusion to 125 mL/hour.  4. Repeat ABG at 5 AM.      Intervention Category Major Interventions: Acid-Base disturbance - evaluation and management;Respiratory failure - evaluation and management  Henry [name] 12/17/2019, 1:45 AM

## 2019-12-17 NOTE — Progress Notes (Signed)
Pt turned supine at this time with no complications. Pt stable throughout, VS within normal limits. ETT secured in proper position with commercial tube holder. No breakdown noted.

## 2019-12-17 DEATH — deceased

## 2019-12-18 LAB — GLUCOSE, CAPILLARY
Glucose-Capillary: 167 mg/dL — ABNORMAL HIGH (ref 70–99)
Glucose-Capillary: 169 mg/dL — ABNORMAL HIGH (ref 70–99)
Glucose-Capillary: 163 mg/dL — ABNORMAL HIGH (ref 70–99)
Glucose-Capillary: 107 mg/dL — ABNORMAL HIGH (ref 70–99)
Glucose-Capillary: 140 mg/dL — ABNORMAL HIGH (ref 70–99)

## 2019-12-18 LAB — POCT I-STAT 7, (LYTES, BLD GAS, ICA,H+H)
Acid-Base Excess: 13 mmol/L — ABNORMAL HIGH (ref 0.0–2.0)
Bicarbonate: 40.1 mmol/L — ABNORMAL HIGH (ref 20.0–28.0)
Calcium, Ion: 1 mmol/L — ABNORMAL LOW (ref 1.15–1.40)
HCT: 41 % (ref 39.0–52.0)
Hemoglobin: 13.9 g/dL (ref 13.0–17.0)
O2 Saturation: 81 %
Patient temperature: 98.1
Potassium: 3.2 mmol/L — ABNORMAL LOW (ref 3.5–5.1)
Sodium: 143 mmol/L (ref 135–145)
TCO2: 42 mmol/L — ABNORMAL HIGH (ref 22–32)
pCO2 arterial: 61.2 mmHg — ABNORMAL HIGH (ref 32.0–48.0)
pH, Arterial: 7.424 (ref 7.350–7.450)
pO2, Arterial: 45 mmHg — ABNORMAL LOW (ref 83.0–108.0)

## 2019-12-18 LAB — CBC WITH DIFFERENTIAL/PLATELET
Abs Immature Granulocytes: 0.04 10*3/uL (ref 0.00–0.07)
Basophils Absolute: 0 10*3/uL (ref 0.0–0.1)
Basophils Relative: 0 %
Eosinophils Absolute: 0.2 10*3/uL (ref 0.0–0.5)
Eosinophils Relative: 3 %
HCT: 44.4 % (ref 39.0–52.0)
Hemoglobin: 13.6 g/dL (ref 13.0–17.0)
Immature Granulocytes: 1 %
Lymphocytes Relative: 15 %
Lymphs Abs: 1.1 10*3/uL (ref 0.7–4.0)
MCH: 29.4 pg (ref 26.0–34.0)
MCHC: 30.6 g/dL (ref 30.0–36.0)
MCV: 95.9 fL (ref 80.0–100.0)
Monocytes Absolute: 0.2 10*3/uL (ref 0.1–1.0)
Monocytes Relative: 3 %
Neutro Abs: 6.1 10*3/uL (ref 1.7–7.7)
Neutrophils Relative %: 78 %
Platelets: 105 10*3/uL — ABNORMAL LOW (ref 150–400)
RBC: 4.63 MIL/uL (ref 4.22–5.81)
RDW: 15.1 % (ref 11.5–15.5)
WBC: 7.7 10*3/uL (ref 4.0–10.5)
nRBC: 0 % (ref 0.0–0.2)

## 2019-12-18 LAB — MAGNESIUM: Magnesium: 2.3 mg/dL (ref 1.7–2.4)

## 2019-12-18 LAB — FERRITIN: Ferritin: 154 ng/mL (ref 24–336)

## 2019-12-18 LAB — C-REACTIVE PROTEIN: CRP: 6.7 mg/dL — ABNORMAL HIGH (ref <1.0)

## 2019-12-18 LAB — PHOSPHORUS: Phosphorus: 1.5 mg/dL — ABNORMAL LOW (ref 2.5–4.6)

## 2019-12-18 MED ORDER — POTASSIUM PHOSPHATES 15 MMOLE/5ML IV SOLN
15.0000 mmol | Freq: Once | INTRAVENOUS | Status: AC
  Administered 2019-12-18: 15 mmol via INTRAVENOUS
  Filled 2019-12-18: qty 5

## 2019-12-18 MED ORDER — PANTOPRAZOLE SODIUM 40 MG PO PACK
40.0000 mg | PACK | Freq: Every day | ORAL | Status: DC
  Administered 2019-12-18 – 2020-01-03 (×16): 40 mg
  Filled 2019-12-18 (×18): qty 20

## 2019-12-18 MED ORDER — FUROSEMIDE 10 MG/ML IJ SOLN
40.0000 mg | Freq: Two times a day (BID) | INTRAMUSCULAR | Status: AC
  Administered 2019-12-18 (×2): 40 mg via INTRAVENOUS
  Filled 2019-12-18 (×2): qty 4

## 2019-12-18 NOTE — Progress Notes (Signed)
Family?friend contact: [name] Grammes [phone]. Speaks english and will give info to other family members.

## 2019-12-18 NOTE — Progress Notes (Signed)
Patient placed in prone position by RT x 2 and RN x 5 without complications.  ETT secured with cloth tape at 26 cm.prior to the turn.

## 2019-12-18 NOTE — Progress Notes (Signed)
Spoke with patient family friend on phone to update her per families request.  Explained the therapy of proning and the outcomes encouraged via this therapy.  Patient Art line zeroed and recalibrated after proning.  CVP pressure around 59mm/Hg.  Patient vitals WDL.  Prone was successful.  Patient FIO2 increased from 60% to 80% d/t ABG return value of pO2 50.  Versed 10mg /hr;fentanyl 273mcg/hr; Stopped bicarb per Physician.;Nimbex 3.46mcg/hr;Levo84mcg/min

## 2019-12-18 NOTE — Progress Notes (Signed)
.   NAMEFidel [name], MRN:  [id number], DOB:  1956-09-08, LOS: 16 ADMISSION DATE:  12/01/2019, CONSULTATION DATE:  12/31 REFERRING MD:  Joseph Art, CHIEF COMPLAINT:  Progressive respiratory failure    Brief History   64 year old non-English-speaking [name] admitted on 12/17 with Covid pneumonia, transferred to the intensive care on 12/31 with acute decompensation  From time of admission to 12/31 Completed 5 days of remdesivir Completed 10 days of systemic steroids Transfused with convalescent plasma Received Actemra Never able to titrate off high flow oxygen Empiric antibiotics initiated on the 27th for presumptive aspiration  History of present illness   64 year old [name] non-English-speaking admitted on 12/17 with 5 days of progressive worsening dyspnea and fatigue.  Generalized muscle ache and joint pain.  ER evaluation was consistent with worsening respiratory failure, new pulmonary infiltrates and positive COVID-19 he was admitted to the intensive care. Hospital course: Treated with high flow oxygen, remdesivir and systemic steroids initiated on hospital admission day 12/21 still hypoxic receiving Actemra and convalescent plasma 12/27 concern for possible aspiration Unasyn started 12/31 transfer to the intensive care for acute worsening work of breathing and hypoxia  Past Medical History  Type 2 diabetes poorly controlled Spanish-speaking only  Significant Hospital Events   12/17 admitted.  Started on remdesivir, and systemic steroids 12/20 transferred out of ICU to PCU 12/21 received 1 unit of convalescent plasma, Actemra x1; still needing high flow oxygen  12/27 Asymmetric airspace disease -->presumptive aspiration event. Unasyn started.  12/31: Progressive worsening of work of breathing, saturations in 70s with respiratory rate in the 40s still on high flow oxygen at 40 L transferred emergently to intensive care intubated initial plateau pressure 52 1/1: Required norepinephrine,  had decreased PaO2 while proned.  FiO2 0.90, PEEP 12  Consults:  Critical care consulted 12/31 Speech-language pathology  Procedures:  Oral endotracheal tube 12/31>>> Right IJ triple-lumen catheter 12/31>>> Radial A-line 12/31>>  Significant Diagnostic Tests:  Echocardiogram 1/1 >> LVEF 60-65%, grade 1 diastolic dysfunction, moderately elevated PASP with globally normal RV function  Micro Data:  12/17 point of care coronavirus positive Blood 12/17 >> negative MRSA screen 12/18 >> negative resp culture 12/31>>> rare GNR >>   Antimicrobials:  unasyn 12/27>>12/31 Cefepime 12/31>>> vanc 12/31>>> 1/2 Remdesivir from 12/27 through 12/31 (completed 5 days) Interim history/subjective:  FiO2 0.60, PEEP 14.  Plateau pressures remained in the 40s even paralyzed Norepinephrine at 9 Has remained on bicarbonate drip 50 cc/h overnight Proning deferred as he seemed to desaturate when this was done immediately following intubation 12/31 I/O +1.9 L total   Objective   Blood pressure (!) 85/61, pulse 80, temperature 98.1 F (36.7 C), temperature source Axillary, resp. rate (!) 35, height 5\' 5"  (1.651 m), weight 80.3 kg, SpO2 96 %. CVP:  [9 mmHg-65 mmHg] 10 mmHg  Vent Mode: PRVC FiO2 (%):  [60 %-100 %] 60 % Set Rate:  [32 bmp-35 bmp] 35 bmp Vt Set:  [370 mL-430 mL] 430 mL PEEP:  [12 cmH20-16 cmH20] 14 cmH20 Plateau Pressure:  [42 cmH20-44 cmH20] 42 cmH20   Intake/Output Summary (Last 24 hours) at 12/18/2019 0906 Last data filed at 12/18/2019 0800 Gross per 24 hour  Intake 6261.89 ml  Output 3305 ml  Net 2956.89 ml   Filed Weights   12/16/19 0500 12/17/19 0500 12/18/19 0500  Weight: 72 kg 74.3 kg 80.3 kg    Examination: General: Henry [name] currently sedated and paralyzed on mechanical ventilator HENT: ETT in place, no oral lesions, pupils equal Lungs:  coarse B BS, no wheeze Cardiovascular: regular, distant, no M Abdomen: Soft non-distended, hypoactive  BS Extremities: No edema Neuro: deep sedation, RASS -4, unresponsive to pain  Resolved Hospital Problem list     Assessment & Plan:  Acute hypoxic respiratory failure secondary to Covid pneumonia with ARDS, further complicated by HCAP versus aspiration event -Ongoing concern for dysphagia documented Portable chest x-ray 1/1 reviewed by me, bilateral interstitial infiltrates, right greater than left with an elevated right hemidiaphragm and right basilar infiltrate -Has completed systemic steroids and remdesivir Plan Continue low tidal volume ventilation.  His VT were liberalized to 7 cc/kg on 1/1 given significant respiratory acidosis.  Remains on bicarbonate drip, will attempt to wean to off on 1/2.  May need to consider decreasing VT back down to 6 cc/kg Wean PEEP and FiO2 as able Goal plateau pressure less than 30, driving pressure less than 15 Restart proning based on current gas exchange and Pplat 40's Continue diuresis as he can tolerate, goal CVP 5-8 Empiric coverage for possible HCAP, note GNR on respiratory culture.  Discontinue vancomycin and continue cefepime 1/2 Deep sedation per protocol RASS -4, paralytics at this time  Shock, multifactorial.  Suspect significant contribution of sedating medications and his acidosis.  Consider also contribution of septic shock due to HCAP Plan Continue support blood pressure with norepinephrine, wean as able.  MAP goal greater than 65  Diabetes type 2 With hyperglycemia Plan Sliding-scale insulin as ordered Levemir 17 units twice daily  At risk for fluid and electrolyte balance Plan Continue to follow urine output, BMP Should be able to stop bicarb today - improved pH  Protein calorie malnutrition in setting of prolonged critical illness Plan Tube feeding as ordered  Best practice:  Diet: Tube feeds Pain/Anxiety/Delirium protocol (if indicated): Versed, fentanyl VAP protocol (if indicated): 12/31 DVT prophylaxis: Low molecular  weight heparin GI prophylaxis: PPI Glucose control: Sliding scale insulin Mobility: Bedrest Code Status: Full code Family Communication:   Disposition: Critical care ICU  Labs   CBC: Recent Labs  Lab 12/14/19 0245 12/15/19 0132 12/16/19 0000 12/16/19 2213 12/17/19 0422 12/17/19 0500 12/17/19 1553 12/18/19 0530  WBC 9.0 7.4 8.9  --   --  10.4  --  7.7  NEUTROABS 7.7 6.1 7.2  --   --  9.2*  --  PENDING  HGB 16.0 15.2 16.0 17.7* 16.0 14.8 16.7 13.6  HCT Henry.9 47.8 50.1 52.0 47.0 Henry.5 49.0 44.4  MCV 89.9 91.0 91.9  --   --  95.7  --  95.9  PLT 182 157 150  --   --  125*  --  PENDING    Basic Metabolic Panel: Recent Labs  Lab 12/14/19 0245 12/15/19 0132 12/16/19 0000 12/16/19 2200 12/17/19 0422 12/17/19 0500 12/17/19 0545 12/17/19 1200 12/17/19 1553 12/18/19 0530  NA 143 143 145 145 149* 148* 147* 147* 147*  --   K 4.2 4.0 3.8 5.2* 4.0 3.9 3.9 3.6 4.0  --   CL 109 111 111 110  --  106 105 102  --   --   CO2 26 24 23 30   --  33* 33* 36*  --   --   GLUCOSE 174* 127* 43* 154*  --  145* 144* 182*  --   --   BUN 31* 26* 21 22  --  18 18 15   --   --   CREATININE 0.57* 0.62 0.61 0.74  --  0.63 0.59* 0.49*  --   --   CALCIUM  8.1* 7.8* 7.9* 7.5*  --  7.1* 7.1* 6.8*  --   --   MG 2.3 2.3 2.2  --   --  2.3  --   --   --  2.3  PHOS 4.2 3.9 3.2  --   --  3.9  --   --   --  1.5*   GFR: Estimated Creatinine Clearance: 93.4 mL/min (A) (by C-G formula based on SCr of 0.49 mg/dL (L)). Recent Labs  Lab 12/12/19 1650 12/13/19 0448 12/14/19 0245 12/15/19 0132 12/16/19 0000 12/17/19 0500 12/18/19 0530  PROCALCITON <0.10 <0.10 <0.10  --   --   --   --   WBC  --  11.0* 9.0 7.4 8.9 10.4 7.7    Liver Function Tests: Recent Labs  Lab 12/13/19 0448 12/14/19 0245 12/15/19 0132 12/16/19 0000 12/17/19 0500  AST Henry* 55* 51* Henry* 45*  ALT 118* 120* 108* 142* 113*  ALKPHOS 68 76 Henry 79 77  BILITOT 1.0 0.9 1.1 1.1 1.3*  PROT 5.2* 5.1* 5.0* 5.0* 4.5*  ALBUMIN 2.5* 2.6* 2.5* 2.6*  2.5*   No results for input(s): LIPASE, AMYLASE in the last 168 hours. No results for input(s): AMMONIA in the last 168 hours.  ABG    Component Value Date/Time   PHART 7.256 (L) 12/17/2019 1553   PCO2ART 92.9 (HH) 12/17/2019 1553   PO2ART 83.0 12/17/2019 1553   HCO3 Henry.3 (H) 12/17/2019 1553   TCO2 44 (H) 12/17/2019 1553   ACIDBASEDEF 4.0 (H) 12/16/2019 2213   O2SAT 93.0 12/17/2019 1553     Coagulation Profile: No results for input(s): INR, PROTIME in the last 168 hours.  Cardiac Enzymes: No results for input(s): CKTOTAL, CKMB, CKMBINDEX, TROPONINI in the last 168 hours.  HbA1C: Hgb A1c MFr Bld  Date/Time Value Ref Range Status  12-23-2019 07:13 PM 7.5 (H) 4.8 - 5.6 % Final    Comment:    (NOTE) Pre diabetes:          5.7%-6.4% Diabetes:              >6.4% Glycemic control for   <7.0% adults with diabetes     CBG: Recent Labs  Lab 12/16/19 2357 12/17/19 0500 12/17/19 2016 12/18/19 0402 12/18/19 0748  GLUCAP 97 166* 139* 167* 169*     Critical care time: 33 minutes.      Baltazar Apo, MD, PhD 12/18/2019, 9:06 AM [facility] [phone] or if no answer [phone]

## 2019-12-18 NOTE — Progress Notes (Signed)
Patient remains intubation therapy at 60% Peep 14 PRVC.  Lab this morning returned pO2 of 50.  Physician recommended to prone patient.  Delivered 40mg  lasix per diuresis therapy.  Patient UOP increased.  Sedated with 10mg /hr versed; Paralyzed Nimbex at 3.76mcg/hr; pressor Levo increased from 78mcg/min to 62mcg/min to keep patients MAP above 65.  Current MAP of (69)

## 2019-12-19 DIAGNOSIS — L8990 Pressure ulcer of unspecified site, unspecified stage: Secondary | ICD-10-CM | POA: Insufficient documentation

## 2019-12-19 LAB — GLUCOSE, CAPILLARY
Glucose-Capillary: 100 mg/dL — ABNORMAL HIGH (ref 70–99)
Glucose-Capillary: 103 mg/dL — ABNORMAL HIGH (ref 70–99)
Glucose-Capillary: 106 mg/dL — ABNORMAL HIGH (ref 70–99)
Glucose-Capillary: 135 mg/dL — ABNORMAL HIGH (ref 70–99)
Glucose-Capillary: 72 mg/dL (ref 70–99)

## 2019-12-19 LAB — POCT I-STAT 7, (LYTES, BLD GAS, ICA,H+H)
Acid-Base Excess: 7 mmol/L — ABNORMAL HIGH (ref 0.0–2.0)
Bicarbonate: 34.9 mmol/L — ABNORMAL HIGH (ref 20.0–28.0)
Calcium, Ion: 1.08 mmol/L — ABNORMAL LOW (ref 1.15–1.40)
HCT: 38 % — ABNORMAL LOW (ref 39.0–52.0)
Hemoglobin: 12.9 g/dL — ABNORMAL LOW (ref 13.0–17.0)
O2 Saturation: 90 %
Patient temperature: 36.5
Potassium: 3.4 mmol/L — ABNORMAL LOW (ref 3.5–5.1)
Sodium: 144 mmol/L (ref 135–145)
TCO2: 37 mmol/L — ABNORMAL HIGH (ref 22–32)
pCO2 arterial: 60.8 mmHg — ABNORMAL HIGH (ref 32.0–48.0)
pH, Arterial: 7.365 (ref 7.350–7.450)
pO2, Arterial: 62 mmHg — ABNORMAL LOW (ref 83.0–108.0)

## 2019-12-19 LAB — CBC WITH DIFFERENTIAL/PLATELET
Abs Immature Granulocytes: 0.06 10*3/uL (ref 0.00–0.07)
Basophils Absolute: 0 10*3/uL (ref 0.0–0.1)
Basophils Relative: 0 %
Eosinophils Absolute: 0.2 10*3/uL (ref 0.0–0.5)
Eosinophils Relative: 3 %
HCT: 45.6 % (ref 39.0–52.0)
Hemoglobin: 14 g/dL (ref 13.0–17.0)
Immature Granulocytes: 1 %
Lymphocytes Relative: 14 %
Lymphs Abs: 0.9 10*3/uL (ref 0.7–4.0)
MCH: 29.4 pg (ref 26.0–34.0)
MCHC: 30.7 g/dL (ref 30.0–36.0)
MCV: 95.6 fL (ref 80.0–100.0)
Monocytes Absolute: 0.2 10*3/uL (ref 0.1–1.0)
Monocytes Relative: 3 %
Neutro Abs: 4.8 10*3/uL (ref 1.7–7.7)
Neutrophils Relative %: 79 %
Platelets: 100 10*3/uL — ABNORMAL LOW (ref 150–400)
RBC: 4.77 MIL/uL (ref 4.22–5.81)
RDW: 15.1 % (ref 11.5–15.5)
WBC: 6.1 10*3/uL (ref 4.0–10.5)
nRBC: 0 % (ref 0.0–0.2)

## 2019-12-19 LAB — BASIC METABOLIC PANEL
Anion gap: 6 (ref 5–15)
BUN: 16 mg/dL (ref 8–23)
CO2: 36 mmol/L — ABNORMAL HIGH (ref 22–32)
Calcium: 6.8 mg/dL — ABNORMAL LOW (ref 8.9–10.3)
Chloride: 104 mmol/L (ref 98–111)
Creatinine, Ser: 0.55 mg/dL — ABNORMAL LOW (ref 0.61–1.24)
GFR calc Af Amer: >60 mL/min (ref >60)
GFR calc non Af Amer: >60 mL/min (ref >60)
Glucose, Bld: 80 mg/dL (ref 70–99)
Potassium: 3.8 mmol/L (ref 3.5–5.1)
Sodium: 146 mmol/L — ABNORMAL HIGH (ref 135–145)

## 2019-12-19 LAB — CULTURE, RESPIRATORY W GRAM STAIN

## 2019-12-19 LAB — MAGNESIUM: Magnesium: 2.3 mg/dL (ref 1.7–2.4)

## 2019-12-19 LAB — C-REACTIVE PROTEIN: CRP: 4.2 mg/dL — ABNORMAL HIGH (ref <1.0)

## 2019-12-19 LAB — PHOSPHORUS: Phosphorus: 2 mg/dL — ABNORMAL LOW (ref 2.5–4.6)

## 2019-12-19 LAB — FERRITIN: Ferritin: 137 ng/mL (ref 24–336)

## 2019-12-19 MED ORDER — PRO-STAT SUGAR FREE PO LIQD
60.0000 mL | Freq: Two times a day (BID) | ORAL | Status: DC
  Administered 2019-12-19 – 2019-12-22 (×6): 60 mL via ORAL
  Filled 2019-12-19 (×5): qty 60

## 2019-12-19 MED ORDER — GLUCERNA 1.5 CAL PO LIQD
1000.0000 mL | ORAL | Status: DC
  Administered 2019-12-19 – 2019-12-22 (×4): 1000 mL
  Filled 2019-12-19 (×4): qty 1000

## 2019-12-19 MED ORDER — FREE WATER
100.0000 mL | Freq: Three times a day (TID) | Status: DC
  Administered 2019-12-19 – 2019-12-20 (×3): 100 mL

## 2019-12-19 MED ORDER — INSULIN DETEMIR 100 UNIT/ML ~~LOC~~ SOLN
17.0000 [IU] | Freq: Every day | SUBCUTANEOUS | Status: DC
  Administered 2019-12-20 – 2019-12-21 (×2): 17 [IU] via SUBCUTANEOUS
  Filled 2019-12-19 (×3): qty 0.17

## 2019-12-19 MED ORDER — PRO-STAT SUGAR FREE PO LIQD
30.0000 mL | Freq: Every day | ORAL | Status: DC
  Administered 2019-12-19 – 2019-12-22 (×4): 30 mL via ORAL
  Filled 2019-12-19 (×4): qty 30

## 2019-12-19 MED ORDER — SODIUM CHLORIDE 0.9 % IV SOLN
2.0000 g | INTRAVENOUS | Status: DC
  Administered 2019-12-19: 2 g via INTRAVENOUS
  Filled 2019-12-19 (×2): qty 20

## 2019-12-19 MED ORDER — CALCIUM GLUCONATE-NACL 1-0.675 GM/50ML-% IV SOLN
1.0000 g | Freq: Once | INTRAVENOUS | Status: AC
  Administered 2019-12-19: 11:00:00 1000 mg via INTRAVENOUS
  Filled 2019-12-19: qty 50

## 2019-12-19 NOTE — Progress Notes (Signed)
1155: Pt flipped supine with no immediate complications. Will monitor  1400: Family member [name] updated via phone, all questions answered. Elink confirmed with RN that video chat should still work with family members even though they are in [place]. [place] notified, family planning on 2000 this evening for chat, will inform next RN.

## 2019-12-19 NOTE — Progress Notes (Signed)
Assisted tele visit to patient with family member.  Hairo Garraway McEachran, RN  

## 2019-12-19 NOTE — Progress Notes (Signed)
Nutrition Follow-up  INTERVENTION:   Tube feeding via Cortrak: -Continue Glucerna 1.5 @ 40 ml/hr -Provide 30 ml Prostat 5 times daily -This will provide 1940 kcals (88% of needs), 154g protein and 728 ml H2O.  NUTRITION DIAGNOSIS:   Inadequate oral intake related to inability to eat as evidenced by NPO status.  Ongoing.  GOAL:   Patient will meet greater than or equal to 90% of their needs  Progressing.  MONITOR:   Diet advancement, Labs, Weight trends, TF tolerance, Vent  REASON FOR ASSESSMENT:   Consult, Ventilator Enteral/tube feeding initiation and management  ASSESSMENT:   64 year old male who presented to the ED on 12/17 with SOB and abdominal pain. No known PMH. Pt tested positive for COVID-19.  **RD working remotely**  Patient was intubated 12/31 following desaturation and intermittent agitation/anxiety. Pt was planned for FEES but respiratory status worsened.  Pt continuing Glucerna 1.5 but only at 40 ml/hr during proning.   Patient is currently intubated on ventilator support MV: 15 L/min Temp (24hrs), Avg:99.4 F (37.4 C), Min:98.2 F (36.8 C), Max:100.4 F (38 C)  Admission weight: 173 lbs. Current weight: 157 lbs.  I/Os: +4.8L since 12/20 UOP: 2595 ml x 24 hrs  Medications: IV calcium gluconate, Nimbex infusion, Fentanyl infusion, Versed infusion,  Levophed infusion Labs reviewed: CBGs: 100-135 Elevated Na Low Phos Mg WNL   Diet Order:   Diet Order    None      EDUCATION NEEDS:   No education needs have been identified at this time  Skin:  Skin Assessment: Reviewed RN Assessment  Last BM:  12/29  Height:   Ht Readings from Last 1 Encounters:  12/04/19 5\' 5"  (1.651 m)    Weight:   Wt Readings from Last 1 Encounters:  12/19/19 71.6 kg    Ideal Body Weight:  61.8 kg  BMI:  Body mass index is 26.27 kg/m.  Estimated Nutritional Needs:   Kcal:  2200-2400  Protein:  110-130 g  Fluid:  >/= 2.0 L   02/16/20,  MS, RD, LDN Inpatient Clinical Dietitian Pager: [phone] After Hours Pager: [phone]

## 2019-12-19 NOTE — Progress Notes (Signed)
Patient placed in supine position by RT x 2 and RN x 4 without complications.  ETT re-secured with a commercial tube holder.

## 2019-12-19 NOTE — Progress Notes (Signed)
.   NAMEVanderbilt [name], MRN:  [id number], DOB:  1956/09/11, LOS: 17 ADMISSION DATE:  11/20/2019, CONSULTATION DATE:  12/31 REFERRING MD:  Joseph Art, CHIEF COMPLAINT:  Progressive respiratory failure    Brief History   64 year old non-English-speaking male admitted on 12/17 with Covid pneumonia, transferred to the intensive care on 12/31 with acute decompensation  From time of admission to 12/31 Completed 5 days of remdesivir Completed 10 days of systemic steroids Transfused with convalescent plasma Received Actemra Never able to titrate off high flow oxygen Empiric antibiotics initiated on the 27th for presumptive aspiration  History of present illness   64 year old male non-English-speaking admitted on 12/17 with 5 days of progressive worsening dyspnea and fatigue.  Generalized muscle ache and joint pain. ER evaluation was consistent with worsening respiratory failure, new pulmonary infiltrates and positive COVID-19 he was admitted to the intensive care. Hospital course: Treated with high flow oxygen, remdesivir and systemic steroids initiated on hospital admission day 12/21 still hypoxic receiving Actemra and convalescent plasma 12/27 concern for possible aspiration Unasyn started 12/31 transfer to the intensive care for acute worsening work of breathing and hypoxia  Past Medical History  Type 2 diabetes poorly controlled Spanish-speaking only   Significant Hospital Events   12/17 admitted.  Started on remdesivir, and systemic steroids 12/20 transferred out of ICU to PCU 12/21 received 1 unit of convalescent plasma, Actemra x1; still needing high flow oxygen  12/27 Asymmetric airspace disease --> presumptive aspiration event. Unasyn started.  12/31: Progressive worsening of work of breathing, saturations in 70s with respiratory rate in the 40s still on high flow oxygen at 40 L transferred emergently to intensive care intubated initial plateau pressure 52 1/1: Required norepinephrine,  had decreased PaO2 while proned. FiO2 0.90, PEEP 12  Consults:  Critical care consulted 12/31 Speech-language pathology  Procedures:  Oral endotracheal tube 12/31>>> Right IJ triple-lumen catheter 12/31>>> Radial A-line 12/31>>  Significant Diagnostic Tests:  CTA chest 12/18 >> 1. No pulmonary embolus. 2. Progressive bilateral lung opacities from radiograph yesterday. Multifocal ground-glass opacities with superimposed consolidations in the dependent lower lobes with air bronchograms. Pattern consistent with COVID-19 pneumonia, superimposed bacterial infection in the lower lobes is also considered. 3. Coronary artery calcifications.  Echocardiogram 1/1 >> LVEF 60-65%, grade 1 diastolic dysfunction, moderately elevated PASP with globally normal RV function  Micro Data:  12/17 point of care coronavirus positive Blood 12/17 >> negative MRSA screen 12/18 >> negative Resp culture 12/31 >> Moderate Serratia Marcecens, susceptibilities pending   Antimicrobials:  unasyn 12/27>>12/31 Cefepime 12/31>>> vanc 12/31>>> 1/2 Remdesivir from 12/27 through 12/31 (completed 5 days)  Interim history/subjective:  Currently prone on PRVC with FIO2 of 60 PEEP 14 and plateau pressure remains in the 40's   Objective   Blood pressure 98/70, pulse 87, temperature 99.5 F (37.5 C), temperature source Oral, resp. rate (!) 36, height 5\' 5"  (1.651 m), weight 71.6 kg, SpO2 99 %. CVP:  [7 mmHg-9 mmHg] 8 mmHg  Vent Mode: PRVC FiO2 (%):  [60 %-80 %] 80 % Set Rate:  [35 bmp] 35 bmp Vt Set:  [430 mL] 430 mL PEEP:  [14 cmH20] 14 cmH20 Plateau Pressure:  [39 cmH20-46 cmH20] 40 cmH20   Intake/Output Summary (Last 24 hours) at 12/19/2019 0922 Last data filed at 12/19/2019 0600 Gross per 24 hour  Intake 3764.81 ml  Output 2460 ml  Net 1304.81 ml   Filed Weights   12/17/19 0500 12/18/19 0500 12/19/19 0500  Weight: 74.3 kg 80.3 kg 71.6 kg  Examination: General: Chronically ill appearing elderly male  prone on mechanical ventilation, in NAD HEENT: ETT, MM pink/moist, PERRL,  Neuro: Sedated and para.yzed on vent, RASS goal -4 CV: s1s2 regular rate and rhythm, no murmur, rubs, or gallops,  PULM: Breath sounds remains course, no added breathsounds  GI: soft, bowel sounds active in all 4 quadrants, non-tender, non-distended, tolerating TF Extremities: warm/dry, no edema  Skin: no rashes or lesions  Resolved Hospital Problem list     Assessment & Plan:  Acute hypoxic respiratory failure secondary to Covid pneumonia with ARDS, further complicated by HCAP versus aspiration event -Ongoing concern for dysphagia documented Portable chest x-ray 1/1 reviewed by me, bilateral interstitial infiltrates, right greater than left with an elevated right hemidiaphragm and right basilar infiltrate -Has completed systemic steroids and remdesivir Plan: Continue ventilator support with lung protective strategies  Remains with a VT of 7cc/kg due to significant respiratory acidosis  Possibly decreased VT back to 6cc/kg today  Wean PEEP and FiO2 for sats greater than 90%. Head of bed elevated 30 degrees. Plateau pressures less than 30 cm H20.  Follow intermittent chest x-ray and ABG.   Ensure adequate pulmonary hygiene  Follow cultures  VAP bundle in place  PAD protocol Continue to diurese as able, CVP currently 8 Continue IV antibiotics Respiratory culture with Moderate Serratia Marcecens, susceptibilities pending  Continue deep sedation with RASS goal of-4  Shock, multifactorial.   -Suspect significant contribution of sedating medications and his acidosis.  Consider also contribution of septic shock due to HCAP Plan Continue pressor support for MAP goal >65 Wean Levo as able  Diabetes type 2 With hyperglycemia Plan Sliding scale insulin  BID long acting insulin as well  Monitor CBG q4hrs   At risk for fluid and electrolyte balance Plan Continue to monitor urine output and bp Bicarb drip  stopped 01/02  Protein calorie malnutrition in setting of prolonged critical illness Plan Tube feed for nutritional support   Hypocalcemia Plan Trend and supplement as needed   Best practice:  Diet: Tube feeds Pain/Anxiety/Delirium protocol (if indicated): Versed, fentanyl VAP protocol (if indicated): 12/31 DVT prophylaxis: Low molecular weight heparin GI prophylaxis: PPI Glucose control: Sliding scale insulin Mobility: Bedrest Code Status: Full code Family Communication:   Disposition: Critical care ICU  Labs   CBC: Recent Labs  Lab 12/15/19 0132 12/16/19 0000 12/17/19 0500 12/17/19 1553 12/18/19 0530 12/18/19 1113 12/19/19 0421  WBC 7.4 8.9 10.4  --  7.7  --  6.1  NEUTROABS 6.1 7.2 9.2*  --  6.1  --  4.8  HGB 15.2 16.0 14.8 16.7 13.6 13.9 14.0  HCT 47.8 50.1 48.5 49.0 44.4 41.0 45.6  MCV 91.0 91.9 95.7  --  95.9  --  95.6  PLT 157 150 125*  --  105*  --  100*    Basic Metabolic Panel: Recent Labs  Lab 12/15/19 0132 12/16/19 0000 12/16/19 2200 12/17/19 0500 12/17/19 0545 12/17/19 1200 12/17/19 1553 12/18/19 0530 12/18/19 1113 12/19/19 0421  NA 143 145 145 148* 147* 147* 147*  --  143 146*  K 4.0 3.8 5.2* 3.9 3.9 3.6 4.0  --  3.2* 3.8  CL 111 111 110 106 105 102  --   --   --  104  CO2 24 23 30  33* 33* 36*  --   --   --  36*  GLUCOSE 127* 43* 154* 145* 144* 182*  --   --   --  80  BUN 26* 21 22  18 18 15   --   --   --  16  CREATININE 0.62 0.61 0.74 0.63 0.59* 0.49*  --   --   --  0.55*  CALCIUM 7.8* 7.9* 7.5* 7.1* 7.1* 6.8*  --   --   --  6.8*  MG 2.3 2.2  --  2.3  --   --   --  2.3  --  2.3  PHOS 3.9 3.2  --  3.9  --   --   --  1.5*  --  2.0*   GFR: Estimated Creatinine Clearance: 83.3 mL/min (A) (by C-G formula based on SCr of 0.55 mg/dL (L)). Recent Labs  Lab 12/12/19 1650 12/13/19 0448 12/14/19 0245 12/16/19 0000 12/17/19 0500 12/18/19 0530 12/19/19 0421  PROCALCITON <0.10 <0.10 <0.10  --   --   --   --   WBC  --  11.0* 9.0 8.9 10.4 7.7  6.1    Liver Function Tests: Recent Labs  Lab 12/13/19 0448 12/14/19 0245 12/15/19 0132 12/16/19 0000 12/17/19 0500  AST 48* 55* 51* 73* 45*  ALT 118* 120* 108* 142* 113*  ALKPHOS 68 76 73 79 77  BILITOT 1.0 0.9 1.1 1.1 1.3*  PROT 5.2* 5.1* 5.0* 5.0* 4.5*  ALBUMIN 2.5* 2.6* 2.5* 2.6* 2.5*   No results for input(s): LIPASE, AMYLASE in the last 168 hours. No results for input(s): AMMONIA in the last 168 hours.  ABG    Component Value Date/Time   PHART 7.424 12/18/2019 1113   PCO2ART 61.2 (H) 12/18/2019 1113   PO2ART 45.0 (L) 12/18/2019 1113   HCO3 40.1 (H) 12/18/2019 1113   TCO2 42 (H) 12/18/2019 1113   ACIDBASEDEF 4.0 (H) 12/16/2019 2213   O2SAT 81.0 12/18/2019 1113     Coagulation Profile: No results for input(s): INR, PROTIME in the last 168 hours.  Cardiac Enzymes: No results for input(s): CKTOTAL, CKMB, CKMBINDEX, TROPONINI in the last 168 hours.  HbA1C: Hgb A1c MFr Bld  Date/Time Value Ref Range Status  2019-12-11 07:13 PM 7.5 (H) 4.8 - 5.6 % Final    Comment:    (NOTE) Pre diabetes:          5.7%-6.4% Diabetes:              >6.4% Glycemic control for   <7.0% adults with diabetes     CBG: Recent Labs  Lab 12/18/19 1142 12/18/19 1547 12/18/19 2005 12/19/19 0001 12/19/19 0430  GLUCAP 163* 107* 140* 135* 100*     Critical care time:   CRITICAL CARE Performed by: Johnsie Cancel   Total critical care time: 40 minutes  Critical care time was exclusive of separately billable procedures and treating other patients.  Critical care was necessary to treat or prevent imminent or life-threatening deterioration.  Critical care was time spent personally by me on the following activities: development of treatment plan with patient and/or surrogate as well as nursing, discussions with consultants, evaluation of patient's response to treatment, examination of patient, obtaining history from patient or surrogate, ordering and performing treatments and  interventions, ordering and review of laboratory studies, ordering and review of radiographic studies, pulse oximetry and re-evaluation of patient's condition.  Johnsie Cancel, NP-C [facility] Contact / Pager information can be found on Amion  12/19/2019, 9:36 AM

## 2019-12-20 ENCOUNTER — Inpatient Hospital Stay (HOSPITAL_COMMUNITY): Payer: HRSA Program

## 2019-12-20 DIAGNOSIS — J156 Pneumonia due to other aerobic Gram-negative bacteria: Secondary | ICD-10-CM

## 2019-12-20 LAB — BASIC METABOLIC PANEL
Anion gap: 4 — ABNORMAL LOW (ref 5–15)
BUN: 20 mg/dL (ref 8–23)
CO2: 36 mmol/L — ABNORMAL HIGH (ref 22–32)
Calcium: 7.3 mg/dL — ABNORMAL LOW (ref 8.9–10.3)
Chloride: 106 mmol/L (ref 98–111)
Creatinine, Ser: 0.39 mg/dL — ABNORMAL LOW (ref 0.61–1.24)
GFR calc Af Amer: >60 mL/min (ref >60)
GFR calc non Af Amer: >60 mL/min (ref >60)
Glucose, Bld: 162 mg/dL — ABNORMAL HIGH (ref 70–99)
Potassium: 4.2 mmol/L (ref 3.5–5.1)
Sodium: 146 mmol/L — ABNORMAL HIGH (ref 135–145)

## 2019-12-20 LAB — CBC WITH DIFFERENTIAL/PLATELET
Abs Immature Granulocytes: 0.02 10*3/uL (ref 0.00–0.07)
Basophils Absolute: 0 10*3/uL (ref 0.0–0.1)
Basophils Relative: 1 %
Eosinophils Absolute: 0.2 10*3/uL (ref 0.0–0.5)
Eosinophils Relative: 5 %
HCT: 43.4 % (ref 39.0–52.0)
Hemoglobin: 13.1 g/dL (ref 13.0–17.0)
Immature Granulocytes: 1 %
Lymphocytes Relative: 19 %
Lymphs Abs: 0.7 10*3/uL (ref 0.7–4.0)
MCH: 29.1 pg (ref 26.0–34.0)
MCHC: 30.2 g/dL (ref 30.0–36.0)
MCV: 96.4 fL (ref 80.0–100.0)
Monocytes Absolute: 0.1 10*3/uL (ref 0.1–1.0)
Monocytes Relative: 4 %
Neutro Abs: 2.5 10*3/uL (ref 1.7–7.7)
Neutrophils Relative %: 70 %
Platelets: 84 10*3/uL — ABNORMAL LOW (ref 150–400)
RBC: 4.5 MIL/uL (ref 4.22–5.81)
RDW: 15.3 % (ref 11.5–15.5)
WBC: 3.6 10*3/uL — ABNORMAL LOW (ref 4.0–10.5)
nRBC: 0 % (ref 0.0–0.2)

## 2019-12-20 LAB — GLUCOSE, CAPILLARY
Glucose-Capillary: 107 mg/dL — ABNORMAL HIGH (ref 70–99)
Glucose-Capillary: 119 mg/dL — ABNORMAL HIGH (ref 70–99)
Glucose-Capillary: 136 mg/dL — ABNORMAL HIGH (ref 70–99)
Glucose-Capillary: 138 mg/dL — ABNORMAL HIGH (ref 70–99)
Glucose-Capillary: 143 mg/dL — ABNORMAL HIGH (ref 70–99)
Glucose-Capillary: 144 mg/dL — ABNORMAL HIGH (ref 70–99)
Glucose-Capillary: 147 mg/dL — ABNORMAL HIGH (ref 70–99)
Glucose-Capillary: 155 mg/dL — ABNORMAL HIGH (ref 70–99)
Glucose-Capillary: 174 mg/dL — ABNORMAL HIGH (ref 70–99)
Glucose-Capillary: 185 mg/dL — ABNORMAL HIGH (ref 70–99)
Glucose-Capillary: 64 mg/dL — ABNORMAL LOW (ref 70–99)
Glucose-Capillary: 75 mg/dL (ref 70–99)

## 2019-12-20 LAB — POCT I-STAT 7, (LYTES, BLD GAS, ICA,H+H)
Acid-Base Excess: 7 mmol/L — ABNORMAL HIGH (ref 0.0–2.0)
Bicarbonate: 35.8 mmol/L — ABNORMAL HIGH (ref 20.0–28.0)
Calcium, Ion: 1.28 mmol/L (ref 1.15–1.40)
Calcium, Ion: 1.14 mmol/L — ABNORMAL LOW (ref 1.15–1.40)
HCT: 53 % — ABNORMAL HIGH (ref 39.0–52.0)
HCT: 40 % (ref 39.0–52.0)
Hemoglobin: 18 g/dL — ABNORMAL HIGH (ref 13.0–17.0)
Hemoglobin: 13.6 g/dL (ref 13.0–17.0)
O2 Saturation: 95 %
Patient temperature: 98.6
Potassium: 4.5 mmol/L (ref 3.5–5.1)
Potassium: 4.2 mmol/L (ref 3.5–5.1)
Sodium: 146 mmol/L — ABNORMAL HIGH (ref 135–145)
Sodium: 145 mmol/L (ref 135–145)
TCO2: 38 mmol/L — ABNORMAL HIGH (ref 22–32)
pCO2 arterial: >97 mmHg (ref 32.0–48.0)
pCO2 arterial: 73.5 mmHg (ref 32.0–48.0)
pH, Arterial: 7.052 — CL (ref 7.350–7.450)
pH, Arterial: 7.296 — ABNORMAL LOW (ref 7.350–7.450)
pO2, Arterial: 98 mmHg (ref 83.0–108.0)
pO2, Arterial: 86 mmHg (ref 83.0–108.0)

## 2019-12-20 LAB — FERRITIN: Ferritin: 199 ng/mL (ref 24–336)

## 2019-12-20 LAB — C-REACTIVE PROTEIN: CRP: 3.8 mg/dL — ABNORMAL HIGH (ref <1.0)

## 2019-12-20 LAB — MAGNESIUM: Magnesium: 2.2 mg/dL (ref 1.7–2.4)

## 2019-12-20 LAB — PHOSPHORUS: Phosphorus: 2.3 mg/dL — ABNORMAL LOW (ref 2.5–4.6)

## 2019-12-20 MED ORDER — VECURONIUM BROMIDE 10 MG IV SOLR
0.1000 mg/kg | INTRAVENOUS | Status: DC | PRN
  Administered 2019-12-20 – 2019-12-21 (×6): 7 mg via INTRAVENOUS
  Filled 2019-12-20 (×7): qty 10

## 2019-12-20 MED ORDER — STERILE WATER FOR INJECTION IJ SOLN
INTRAMUSCULAR | Status: AC
  Administered 2019-12-20: 10 mL
  Filled 2019-12-20: qty 10

## 2019-12-20 MED ORDER — CHLORHEXIDINE GLUCONATE 0.12 % MT SOLN
15.0000 mL | Freq: Two times a day (BID) | OROMUCOSAL | Status: DC
  Administered 2019-12-20 – 2020-01-03 (×23): 15 mL via OROMUCOSAL
  Filled 2019-12-20 (×16): qty 15

## 2019-12-20 MED ORDER — POTASSIUM CHLORIDE 20 MEQ/15ML (10%) PO SOLN
40.0000 meq | Freq: Once | ORAL | Status: AC
  Administered 2019-12-20: 40 meq
  Filled 2019-12-20: qty 30

## 2019-12-20 MED ORDER — POLYETHYLENE GLYCOL 3350 17 G PO PACK
17.0000 g | PACK | Freq: Two times a day (BID) | ORAL | Status: DC
  Administered 2019-12-20 – 2020-01-03 (×24): 17 g
  Filled 2019-12-20 (×24): qty 1

## 2019-12-20 MED ORDER — FUROSEMIDE 10 MG/ML IJ SOLN
40.0000 mg | Freq: Two times a day (BID) | INTRAMUSCULAR | Status: AC
  Administered 2019-12-20 (×2): 40 mg via INTRAVENOUS
  Filled 2019-12-20 (×2): qty 4

## 2019-12-20 MED ORDER — FREE WATER
200.0000 mL | Status: DC
  Administered 2019-12-20 – 2019-12-21 (×8): 200 mL

## 2019-12-20 MED ORDER — SODIUM CHLORIDE 0.9 % IV SOLN
2.0000 g | INTRAVENOUS | Status: DC
  Administered 2019-12-22 – 2019-12-23 (×3): 2 g via INTRAVENOUS
  Filled 2019-12-20 (×5): qty 20

## 2019-12-20 MED ORDER — SENNOSIDES 8.8 MG/5ML PO SYRP
5.0000 mL | ORAL_SOLUTION | Freq: Every day | ORAL | Status: DC
  Administered 2019-12-20 – 2019-12-21 (×2): 5 mL
  Filled 2019-12-20 (×3): qty 5

## 2019-12-20 MED ORDER — NOREPINEPHRINE 16 MG/250ML-% IV SOLN
0.0000 ug/min | INTRAVENOUS | Status: DC
  Administered 2019-12-20: 09:00:00 2 ug/min via INTRAVENOUS
  Filled 2019-12-20: qty 250

## 2019-12-20 MED ORDER — FENTANYL CITRATE (PF) 2500 MCG/50ML IJ SOLN
50.0000 ug/h | INTRAMUSCULAR | Status: DC
  Administered 2019-12-20: 250 ug/h via INTRAVENOUS
  Administered 2019-12-20: 50 ug/h via INTRAVENOUS
  Administered 2019-12-21 – 2019-12-22 (×6): 250 ug/h via INTRAVENOUS
  Administered 2019-12-23 (×2): 300 ug/h via INTRAVENOUS
  Administered 2019-12-23 (×2): 250 ug/h via INTRAVENOUS
  Administered 2019-12-24: 300 ug/h via INTRAVENOUS
  Filled 2019-12-20 (×15): qty 50

## 2019-12-20 NOTE — Progress Notes (Signed)
Assisted tele visit to patient with family member and interpreter.  Henry [name], [name], RN

## 2019-12-20 NOTE — Progress Notes (Signed)
Head turned to the left.  Arms repositioned.  ETT secured

## 2019-12-20 NOTE — Progress Notes (Signed)
Pt did well over night. Pt proned at 2000 with 2 RT and 3RN. Tolerated well.  Remains on 90% FiO2 and oxygen saturation at 90-92%. Able to wean levophed down through nigh, currently at 34mcg/min with BP 111/57.

## 2019-12-20 NOTE — Progress Notes (Signed)
Video call with pt family including sons and daughter in [place], as well as friend [name]. Answered all questions to best of ability at this time. Family would like to do another video call tomorrow at 8 PM.

## 2019-12-20 NOTE — Progress Notes (Addendum)
.   NAMEChais [name], MRN:  [id number], DOB:  03-30-1956, LOS: 70 ADMISSION DATE:  2019/12/24, CONSULTATION DATE:  12/31 REFERRING MD:  Sherral Hammers, CHIEF COMPLAINT:  Progressive respiratory failure    Brief History   64 year old non-English-speaking male admitted on 12/17 with Covid pneumonia, transferred to the intensive care on 12/31 with acute decompensation  From time of admission to 12/31 Completed 5 days of remdesivir Completed 10 days of systemic steroids Transfused with convalescent plasma Received Actemra Never able to titrate off high flow oxygen Empiric antibiotics initiated on the 27th for presumptive aspiration  Past Medical History  Type 2 diabetes poorly controlled Spanish-speaking only   Significant Hospital Events    12/17 admitted.  Started on remdesivir, and systemic steroids 12/20 transferred out of ICU to PCU 12/21 received 1 unit of convalescent plasma, Actemra x1; still needing high flow oxygen  12/27 Asymmetric airspace disease --> presumptive aspiration event. Unasyn started.  12/31: Progressive worsening of work of breathing, saturations in 70s with respiratory rate in the 40s still on high flow oxygen at 40 L transferred emergently to intensive care intubated initial plateau pressure 52 1/1: Required norepinephrine, had decreased PaO2 while proned. FiO2 0.90, PEEP 12 1/4: vt decreased to 6 cc/kg-->pplat 32.   Consults:  Critical care consulted 12/31 Speech-language pathology  Procedures:  Oral endotracheal tube 12/31>>> Right IJ triple-lumen catheter 12/31>>> Radial A-line 12/31>>  Significant Diagnostic Tests:  CTA chest 12/18 >>1. No pulmonary embolus. 2. Progressive bilateral lung opacities from radiograph yesterday. Multifocal ground-glass opacities with superimposed consolidations in the dependent lower lobes with air bronchograms. Pattern consistent with COVID-19 pneumonia, superimposed bacterial infection in the lower lobes is also  considered. 3. Coronary artery calcifications. Echocardiogram 1/1 >> LVEF 78-29%, grade 1 diastolic dysfunction, moderately elevated PASP with globally normal RV function  Micro Data:  12/17 point of care coronavirus positive Blood 12/17 >> negative MRSA screen 12/18 >> negative Resp culture 12/31 >> Moderate Serratia Marcecens, susceptibilities pending   Antimicrobials:  unasyn 12/27>>12/31 Cefepime 12/31>>> vanc 12/31>>> 1/2 Remdesivir from 12/27 through 12/31 (completed 5 days)  Interim history/subjective:   Currently in prone position.   Objective   Blood pressure 98/72, pulse 68, temperature 97.7 F (36.5 C), resp. rate (Abnormal) 35, height 5\' 5"  (1.651 m), weight 70.4 kg, SpO2 95 %. CVP:  [6 mmHg-12 mmHg] 10 mmHg  Vent Mode: PRVC FiO2 (%):  [60 %-90 %] 90 % Set Rate:  [35 bmp] 35 bmp Vt Set:  [430 mL] 430 mL PEEP:  [14 cmH20] 14 cmH20 Plateau Pressure:  [38 cmH20-42 cmH20] 39 cmH20   Intake/Output Summary (Last 24 hours) at 12/20/2019 0851 Last data filed at 12/20/2019 0800 Gross per 24 hour  Intake 2818.52 ml  Output 954 ml  Net 1864.52 ml   Filed Weights   12/18/19 0500 12/19/19 0500 12/20/19 0500  Weight: 80.3 kg 71.6 kg 70.4 kg    Examination: General-sedated on vent.  pulm decreased t/o; equal chest rise bilaterally  Ventilator assessment: PEEP:14 FiO2:90 Plateau pressure:34-35 (after dropping to 6 ml/kg) Driving pressure: 20 Card regular rate and rhythm  abdomen soft nontender Extremities are warm and dry, capillary refill brisk Neuro sedated and currently on neuromuscular blockade infusion  Resolved Hospital Problem list     Assessment & Plan:  Acute hypoxic respiratory failure secondary to Covid pneumonia with ARDS, further complicated by HCAP (Serratia Marcescens) -Ongoing concern for dysphagia documented Portable chest x-ray personally reviewed: Elevated left hemidiaphragm, endotracheal tube in satisfactory position, rotated and  in prone  position. -Has completed systemic steroids and remdesivir Plan: Continuing full ventilator support, with low tidal volume ventilation, goal 6 to 8 mL/kg predicted body weight, had been at 7 mL/kg due to refractory acidosis, goal is to get him down to 6 mL/kg to ensure limited ventilator associated injury, would except permissive hypercarbia Wean PEEP and FiO2 for target PaO2 55-65 Plateau pressure goal less than 30, driving pressure goal less than 15 Continuing IV diuresis as long as BUN and creatinine will tolerate PAD protocol with RASS goal -4 Change neuromuscular blockade to as needed Continue heavy sedation with midazolam and fentanyl drips, will add oxycodone and clonazepam via tube Day #5 of 7 antibiotics, narrowed to ceftriaxone on 1/3 Chest x-ray today, ABG today Continue VAP bundle  Shock, multifactorial.   -Suspect significant contribution of sedating medications and his acidosis.  Consider also contribution of septic shock due to HCAP Plan Continue to titrate norepinephrine for mean arterial pressure greater than 65 Continuing telemetry monitoring Keeping euvolemic Holding antihypertensives  Diabetes type 2 With hyperglycemia Plan Continue sliding scale insulin Continue current Levemir 17 units daily  fluid and electrolyte balance:hypocalciemia, hypernatremia, hypophosphatemia Plan Repeat am chemistry  Replace lytes as indicated.  Free water replacement, 200 mL every 4 hours   protein calorie malnutrition in setting of prolonged critical illness Plan Cont tubefeeds    Best practice:  Diet: Tube feeds Pain/Anxiety/Delirium protocol (if indicated): Versed, fentanyl VAP protocol (if indicated): 12/31 DVT prophylaxis: Low molecular weight heparin GI prophylaxis: PPI Glucose control: Sliding scale insulin Mobility: Bedrest Code Status: Full code Family Communication:  Lawanna Kobus (family friend->speaking to family in [place] for [place]) Disposition: Critical care  ICU    Critical care time:  CRITICAL CARE Performed by: Shelby Mattocks

## 2019-12-20 NOTE — Progress Notes (Signed)
Pt supined without any complications.  ETT secure.

## 2019-12-20 NOTE — Progress Notes (Signed)
Pt friend updated and all questions answered.

## 2019-12-21 LAB — POCT I-STAT 7, (LYTES, BLD GAS, ICA,H+H)
Acid-Base Excess: 11 mmol/L — ABNORMAL HIGH (ref 0.0–2.0)
Acid-Base Excess: 14 mmol/L — ABNORMAL HIGH (ref 0.0–2.0)
Bicarbonate: 39.9 mmol/L — ABNORMAL HIGH (ref 20.0–28.0)
Bicarbonate: 44 mmol/L — ABNORMAL HIGH (ref 20.0–28.0)
Calcium, Ion: 1.13 mmol/L — ABNORMAL LOW (ref 1.15–1.40)
Calcium, Ion: 1.15 mmol/L (ref 1.15–1.40)
Calcium, Ion: 1.06 mmol/L — ABNORMAL LOW (ref 1.15–1.40)
HCT: 43 % (ref 39.0–52.0)
HCT: 38 % — ABNORMAL LOW (ref 39.0–52.0)
HCT: 41 % (ref 39.0–52.0)
Hemoglobin: 14.6 g/dL (ref 13.0–17.0)
Hemoglobin: 12.9 g/dL — ABNORMAL LOW (ref 13.0–17.0)
Hemoglobin: 13.9 g/dL (ref 13.0–17.0)
O2 Saturation: 86 %
O2 Saturation: 93 %
Patient temperature: 36.8
Potassium: 4.7 mmol/L (ref 3.5–5.1)
Potassium: 4.5 mmol/L (ref 3.5–5.1)
Potassium: 4.3 mmol/L (ref 3.5–5.1)
Sodium: 145 mmol/L (ref 135–145)
Sodium: 146 mmol/L — ABNORMAL HIGH (ref 135–145)
Sodium: 147 mmol/L — ABNORMAL HIGH (ref 135–145)
TCO2: 42 mmol/L — ABNORMAL HIGH (ref 22–32)
TCO2: 47 mmol/L — ABNORMAL HIGH (ref 22–32)
pCO2 arterial: >97 mmHg (ref 32.0–48.0)
pCO2 arterial: 73.5 mmHg (ref 32.0–48.0)
pCO2 arterial: 84.8 mmHg (ref 32.0–48.0)
pH, Arterial: 7.2 — ABNORMAL LOW (ref 7.350–7.450)
pH, Arterial: 7.343 — ABNORMAL LOW (ref 7.350–7.450)
pH, Arterial: 7.322 — ABNORMAL LOW (ref 7.350–7.450)
pO2, Arterial: 73 mmHg — ABNORMAL LOW (ref 83.0–108.0)
pO2, Arterial: 58 mmHg — ABNORMAL LOW (ref 83.0–108.0)
pO2, Arterial: 76 mmHg — ABNORMAL LOW (ref 83.0–108.0)

## 2019-12-21 LAB — BASIC METABOLIC PANEL
Anion gap: 6 (ref 5–15)
BUN: 20 mg/dL (ref 8–23)
CO2: 37 mmol/L — ABNORMAL HIGH (ref 22–32)
Calcium: 7.7 mg/dL — ABNORMAL LOW (ref 8.9–10.3)
Chloride: 103 mmol/L (ref 98–111)
Creatinine, Ser: 0.53 mg/dL — ABNORMAL LOW (ref 0.61–1.24)
GFR calc Af Amer: >60 mL/min (ref >60)
GFR calc non Af Amer: >60 mL/min (ref >60)
Glucose, Bld: 143 mg/dL — ABNORMAL HIGH (ref 70–99)
Potassium: 5.1 mmol/L (ref 3.5–5.1)
Sodium: 146 mmol/L — ABNORMAL HIGH (ref 135–145)

## 2019-12-21 LAB — CBC WITH DIFFERENTIAL/PLATELET
Abs Immature Granulocytes: 0.01 10*3/uL (ref 0.00–0.07)
Basophils Absolute: 0 10*3/uL (ref 0.0–0.1)
Basophils Relative: 0 %
Eosinophils Absolute: 0.2 10*3/uL (ref 0.0–0.5)
Eosinophils Relative: 5 %
HCT: 46.1 % (ref 39.0–52.0)
Hemoglobin: 13.6 g/dL (ref 13.0–17.0)
Immature Granulocytes: 0 %
Lymphocytes Relative: 15 %
Lymphs Abs: 0.5 10*3/uL — ABNORMAL LOW (ref 0.7–4.0)
MCH: 29.5 pg (ref 26.0–34.0)
MCHC: 29.5 g/dL — ABNORMAL LOW (ref 30.0–36.0)
MCV: 100 fL (ref 80.0–100.0)
Monocytes Absolute: 0.2 10*3/uL (ref 0.1–1.0)
Monocytes Relative: 6 %
Neutro Abs: 2.5 10*3/uL (ref 1.7–7.7)
Neutrophils Relative %: 74 %
Platelets: 77 10*3/uL — ABNORMAL LOW (ref 150–400)
RBC: 4.61 MIL/uL (ref 4.22–5.81)
RDW: 15.6 % — ABNORMAL HIGH (ref 11.5–15.5)
WBC: 3.3 10*3/uL — ABNORMAL LOW (ref 4.0–10.5)
nRBC: 0 % (ref 0.0–0.2)

## 2019-12-21 LAB — PHOSPHORUS: Phosphorus: 2.4 mg/dL — ABNORMAL LOW (ref 2.5–4.6)

## 2019-12-21 LAB — MAGNESIUM: Magnesium: 2.3 mg/dL (ref 1.7–2.4)

## 2019-12-21 LAB — GLUCOSE, CAPILLARY
Glucose-Capillary: 148 mg/dL — ABNORMAL HIGH (ref 70–99)
Glucose-Capillary: 125 mg/dL — ABNORMAL HIGH (ref 70–99)
Glucose-Capillary: 185 mg/dL — ABNORMAL HIGH (ref 70–99)
Glucose-Capillary: 119 mg/dL — ABNORMAL HIGH (ref 70–99)
Glucose-Capillary: 119 mg/dL — ABNORMAL HIGH (ref 70–99)

## 2019-12-21 LAB — C-REACTIVE PROTEIN: CRP: 5.2 mg/dL — ABNORMAL HIGH (ref <1.0)

## 2019-12-21 LAB — FERRITIN: Ferritin: 173 ng/mL (ref 24–336)

## 2019-12-21 MED ORDER — FREE WATER
250.0000 mL | Status: DC
  Administered 2019-12-21 – 2020-01-04 (×75): 250 mL

## 2019-12-21 MED ORDER — ARTIFICIAL TEARS OPHTHALMIC OINT
1.0000 "application " | TOPICAL_OINTMENT | Freq: Three times a day (TID) | OPHTHALMIC | Status: DC
  Administered 2019-12-21 – 2020-01-04 (×42): 1 via OPHTHALMIC
  Filled 2019-12-21 (×2): qty 3.5

## 2019-12-21 MED ORDER — CLONAZEPAM 1 MG PO TABS
2.0000 mg | ORAL_TABLET | Freq: Two times a day (BID) | ORAL | Status: DC
  Administered 2019-12-21 – 2020-01-03 (×26): 2 mg
  Filled 2019-12-21 (×27): qty 2

## 2019-12-21 MED ORDER — FUROSEMIDE 10 MG/ML IJ SOLN
40.0000 mg | Freq: Two times a day (BID) | INTRAMUSCULAR | Status: AC
  Administered 2019-12-21 – 2019-12-22 (×2): 40 mg via INTRAVENOUS
  Filled 2019-12-21 (×2): qty 4

## 2019-12-21 MED ORDER — OXYCODONE HCL 5 MG PO TABS
5.0000 mg | ORAL_TABLET | Freq: Four times a day (QID) | ORAL | Status: DC
  Administered 2019-12-21 – 2019-12-31 (×31): 5 mg
  Filled 2019-12-21 (×31): qty 1

## 2019-12-21 MED ORDER — SODIUM CHLORIDE 0.9 % IV SOLN
0.0000 ug/kg/min | INTRAVENOUS | Status: DC
  Administered 2019-12-21: 3 ug/kg/min via INTRAVENOUS
  Administered 2019-12-22 – 2019-12-23 (×3): 3.5 ug/kg/min via INTRAVENOUS
  Administered 2019-12-24: 4 ug/kg/min via INTRAVENOUS
  Administered 2019-12-24: 3.5 ug/kg/min via INTRAVENOUS
  Administered 2019-12-25 (×2): 4 ug/kg/min via INTRAVENOUS
  Administered 2019-12-26: 2 ug/kg/min via INTRAVENOUS
  Administered 2019-12-26 – 2019-12-27 (×2): 4 ug/kg/min via INTRAVENOUS
  Administered 2019-12-28: 05:00:00 4.5 ug/kg/min via INTRAVENOUS
  Administered 2019-12-28: 4 ug/kg/min via INTRAVENOUS
  Administered 2019-12-29: 5.5 ug/kg/min via INTRAVENOUS
  Administered 2019-12-29: 11:00:00 4 ug/kg/min via INTRAVENOUS
  Administered 2019-12-30: 08:00:00 5.5 ug/kg/min via INTRAVENOUS
  Filled 2019-12-21 (×25): qty 20

## 2019-12-21 MED ORDER — CISATRACURIUM BOLUS VIA INFUSION
0.0500 mg/kg | Freq: Once | INTRAVENOUS | Status: AC
  Administered 2019-12-21: 3.5 mg via INTRAVENOUS
  Filled 2019-12-21: qty 4

## 2019-12-21 NOTE — Progress Notes (Signed)
Pt friend [name] updated and all questions answered.

## 2019-12-21 NOTE — Progress Notes (Addendum)
.   NAMEKosisochukwu [name], MRN:  [id number], DOB:  05-21-1956, LOS: 35 ADMISSION DATE:  12/11/2019, CONSULTATION DATE:  12/31 REFERRING MD:  Sherral Hammers, CHIEF COMPLAINT:  Progressive respiratory failure    Brief History   64 year old non-English-speaking male admitted on 12/17 with Covid pneumonia, transferred to the intensive care on 12/31 with acute decompensation  From time of admission to 12/31 Completed 5 days of remdesivir Completed 10 days of systemic steroids Transfused with convalescent plasma Received Actemra Never able to titrate off high flow oxygen Empiric antibiotics initiated on the 27th for presumptive aspiration  Past Medical History  Type 2 diabetes poorly controlled Spanish-speaking only   Significant Hospital Events    12/17 admitted.  Started on remdesivir, and systemic steroids 12/20 transferred out of ICU to PCU 12/21 received 1 unit of convalescent plasma, Actemra x1; still needing high flow oxygen  12/27 Asymmetric airspace disease --> presumptive aspiration event. Unasyn started.  12/31: Progressive worsening of work of breathing, saturations in 70s with respiratory rate in the 40s still on high flow oxygen at 40 L transferred emergently to intensive care intubated initial plateau pressure 52 1/1: Required norepinephrine, had decreased PaO2 while proned. FiO2 0.90, PEEP 12 1/4: vt decreased to 6 cc/kg-->pplat 32.  1/5: Plateau pressures still 34.  Requiring frequent hourly neuromuscular blockade, neuromuscular drip infusion reinitiated Consults:  Critical care consulted 12/31 Speech-language pathology  Procedures:  Oral endotracheal tube 12/31>>> Right IJ triple-lumen catheter 12/31>>> Radial A-line 12/31>>  Significant Diagnostic Tests:  CTA chest 12/18 >>1. No pulmonary embolus. 2. Progressive bilateral lung opacities from radiograph yesterday. Multifocal ground-glass opacities with superimposed consolidations in the dependent lower lobes with air  bronchograms. Pattern consistent with COVID-19 pneumonia, superimposed bacterial infection in the lower lobes is also considered. 3. Coronary artery calcifications. Echocardiogram 1/1 >> LVEF 40-98%, grade 1 diastolic dysfunction, moderately elevated PASP with globally normal RV function  Micro Data:  12/17 point of care coronavirus positive Blood 12/17 >> negative MRSA screen 12/18 >> negative Resp culture 12/31 >> Moderate Serratia Marcecens, susceptibilities pending   Antimicrobials:  unasyn 12/27>>12/31 Cefepime 12/31>>> vanc 12/31>>> 1/2 Remdesivir from 12/27 through 12/31 (completed 5 days)  Interim history/subjective:  No significant change compared to yesterday  Objective   Blood pressure 113/76, pulse 99, temperature 98 F (36.7 C), temperature source Oral, resp. rate (Abnormal) 36, height 5\' 5"  (1.651 m), weight 69.9 kg, SpO2 95 %. CVP:  [6 mmHg-58 mmHg] 6 mmHg  Vent Mode: PRVC FiO2 (%):  [90 %] 90 % Set Rate:  [35 bmp] 35 bmp Vt Set:  [370 mL-430 mL] 370 mL PEEP:  [14 cmH20] 14 cmH20 Plateau Pressure:  [34 cmH20-40 cmH20] 36 cmH20   Intake/Output Summary (Last 24 hours) at 12/21/2019 1191 Last data filed at 12/21/2019 0800 Gross per 24 hour  Intake 2689.08 ml  Output 2390 ml  Net 299.08 ml   Filed Weights   12/19/19 0500 12/20/19 0500 12/21/19 0500  Weight: 71.6 kg 70.4 kg 69.9 kg    Examination: General 64 year old male currently heavily sedated requiring frequent neuromuscular blockade on ventilator HEENT normocephalic atraumatic orally intubated no clear JVD Pulmonary: Scattered rhonchi bilaterally Ventilator assessment: PEEP:14 FiO2:90 Plateau pressure:37 Driving pressure: 23 Cardiac: Regular rate and rhythm Abdomen: Soft nontender no organomegaly Extremity: Warm dry brisk cap refill Neuro: Heavily sedated   Resolved Hospital Problem list   Multifactorial shock off pressors as of 1/5  Assessment & Plan:  Acute hypoxic respiratory failure  secondary to Covid pneumonia with ARDS,  further complicated by HCAP (Serratia Marcescens) -Ongoing concern for dysphagia documented Portable chest x-ray reviewed on 1/4: This actually looks improved aeration wise when comparing film from 12/31 there continues to be diffuse bilateral airspace disease with consolidation in bases however aeration appears somewhat improved Plan: Respiratory acidosis as long as pH is greater than 7.2 Continuing to titrate PEEP and FiO2 with target PaO2 55-65; we have room to go w/peep/fio2 Target plateau pressure less than 30 and driving pressure less than 15 Continue IV diuresis, he is currently 6 L positive would benefit form getting more negative volume status Continue heavy sedation with RASS goal -4, and as needed neuromuscular blockade Add Oxycodone and clonazepam via tube VAP bundle  Mild hypernatremia Plan Adjusting free water to 250 every 4, if remains hypernatremic on 1/6 we will add D5 water  Diabetes type 2 With hyperglycemia Plan Continuing current Levemir at 17 units daily No change in sliding scale  fluid and electrolyte balance:hypocalciemia, hypernatremia, hypophosphatemia Plan Follow-up chemistry and replace electrolytes as indicated  Continue free water replacement at 200 mL every 4 hours   Mild thrombocytopenia Plan Stable but if continues to drop we will need to reevaluate heparin and other medications repeat CBC in a.m.  protein calorie malnutrition in setting of prolonged critical illness Plan Continue tube feeds    Best practice:  Diet: Tube feeds Pain/Anxiety/Delirium protocol (if indicated): Versed, fentanyl VAP protocol (if indicated): 12/31 DVT prophylaxis: Low molecular weight heparin GI prophylaxis: PPI Glucose control: Sliding scale insulin Mobility: Bedrest Code Status: Full code Family Communication:  Lawanna Kobus (family friend->speaking to family in [place] for [place]) Disposition: Critical care ICU.  I think we should  continue aggressive support through the weekend, if no significant improvement in aeration I suspect we will be transitioning to terminal wean    Critical care time: 36 minutes  CRITICAL CARE Performed by: Shelby Mattocks

## 2019-12-21 NOTE — Progress Notes (Signed)
Pt head turned to right.  Arms readjusted.  No complications.

## 2019-12-21 NOTE — Progress Notes (Signed)
Left a message w/ patient's spoke person. [name]. Will reach out on 1/6  Simonne Martinet ACNP-BC [facility] Care Pager # [phone] OR # [phone] if no answer

## 2019-12-22 ENCOUNTER — Other Ambulatory Visit: Payer: Self-pay

## 2019-12-22 LAB — CBC WITH DIFFERENTIAL/PLATELET
Abs Immature Granulocytes: 0.03 10*3/uL (ref 0.00–0.07)
Basophils Absolute: 0 10*3/uL (ref 0.0–0.1)
Basophils Relative: 1 %
Eosinophils Absolute: 0.2 10*3/uL (ref 0.0–0.5)
Eosinophils Relative: 7 %
HCT: 41.9 % (ref 39.0–52.0)
Hemoglobin: 12.6 g/dL — ABNORMAL LOW (ref 13.0–17.0)
Immature Granulocytes: 1 %
Lymphocytes Relative: 23 %
Lymphs Abs: 0.7 10*3/uL (ref 0.7–4.0)
MCH: 29.5 pg (ref 26.0–34.0)
MCHC: 30.1 g/dL (ref 30.0–36.0)
MCV: 98.1 fL (ref 80.0–100.0)
Monocytes Absolute: 0.2 10*3/uL (ref 0.1–1.0)
Monocytes Relative: 6 %
Neutro Abs: 2 10*3/uL (ref 1.7–7.7)
Neutrophils Relative %: 62 %
Platelets: 76 10*3/uL — ABNORMAL LOW (ref 150–400)
RBC: 4.27 MIL/uL (ref 4.22–5.81)
RDW: 15.7 % — ABNORMAL HIGH (ref 11.5–15.5)
WBC: 3.1 10*3/uL — ABNORMAL LOW (ref 4.0–10.5)
nRBC: 0 % (ref 0.0–0.2)

## 2019-12-22 LAB — POCT I-STAT 7, (LYTES, BLD GAS, ICA,H+H)
Acid-Base Excess: 16 mmol/L — ABNORMAL HIGH (ref 0.0–2.0)
Bicarbonate: 43.8 mmol/L — ABNORMAL HIGH (ref 20.0–28.0)
Calcium, Ion: 1.09 mmol/L — ABNORMAL LOW (ref 1.15–1.40)
HCT: 36 % — ABNORMAL LOW (ref 39.0–52.0)
Hemoglobin: 12.2 g/dL — ABNORMAL LOW (ref 13.0–17.0)
O2 Saturation: 92 %
Patient temperature: 36.5
Potassium: 3.7 mmol/L (ref 3.5–5.1)
Sodium: 144 mmol/L (ref 135–145)
TCO2: 46 mmol/L — ABNORMAL HIGH (ref 22–32)
pCO2 arterial: 66.1 mmHg (ref 32.0–48.0)
pH, Arterial: 7.427 (ref 7.350–7.450)
pO2, Arterial: 63 mmHg — ABNORMAL LOW (ref 83.0–108.0)

## 2019-12-22 LAB — GLUCOSE, CAPILLARY
Glucose-Capillary: 110 mg/dL — ABNORMAL HIGH (ref 70–99)
Glucose-Capillary: 112 mg/dL — ABNORMAL HIGH (ref 70–99)
Glucose-Capillary: 135 mg/dL — ABNORMAL HIGH (ref 70–99)
Glucose-Capillary: 139 mg/dL — ABNORMAL HIGH (ref 70–99)
Glucose-Capillary: 175 mg/dL — ABNORMAL HIGH (ref 70–99)
Glucose-Capillary: 65 mg/dL — ABNORMAL LOW (ref 70–99)
Glucose-Capillary: 83 mg/dL (ref 70–99)

## 2019-12-22 LAB — PHOSPHORUS: Phosphorus: 1.9 mg/dL — ABNORMAL LOW (ref 2.5–4.6)

## 2019-12-22 LAB — C-REACTIVE PROTEIN: CRP: 4.1 mg/dL — ABNORMAL HIGH (ref <1.0)

## 2019-12-22 LAB — BASIC METABOLIC PANEL
Anion gap: 8 (ref 5–15)
BUN: 20 mg/dL (ref 8–23)
CO2: 39 mmol/L — ABNORMAL HIGH (ref 22–32)
Calcium: 7.5 mg/dL — ABNORMAL LOW (ref 8.9–10.3)
Chloride: 100 mmol/L (ref 98–111)
Creatinine, Ser: 0.41 mg/dL — ABNORMAL LOW (ref 0.61–1.24)
GFR calc Af Amer: >60 mL/min (ref >60)
GFR calc non Af Amer: >60 mL/min (ref >60)
Glucose, Bld: 71 mg/dL (ref 70–99)
Potassium: 4 mmol/L (ref 3.5–5.1)
Sodium: 147 mmol/L — ABNORMAL HIGH (ref 135–145)

## 2019-12-22 LAB — FERRITIN: Ferritin: 185 ng/mL (ref 24–336)

## 2019-12-22 LAB — MAGNESIUM: Magnesium: 2.3 mg/dL (ref 1.7–2.4)

## 2019-12-22 MED ORDER — ADULT MULTIVITAMIN W/MINERALS CH
1.0000 | ORAL_TABLET | Freq: Every day | ORAL | Status: DC
  Administered 2019-12-22 – 2020-01-03 (×13): 1
  Filled 2019-12-22 (×13): qty 1

## 2019-12-22 MED ORDER — PRO-STAT SUGAR FREE PO LIQD
60.0000 mL | Freq: Three times a day (TID) | ORAL | Status: DC
  Administered 2019-12-22 – 2019-12-29 (×19): 60 mL via ORAL
  Filled 2019-12-22 (×18): qty 60

## 2019-12-22 MED ORDER — SENNOSIDES 8.8 MG/5ML PO SYRP
5.0000 mL | ORAL_SOLUTION | Freq: Two times a day (BID) | ORAL | Status: DC
  Administered 2019-12-22 – 2020-01-02 (×18): 5 mL
  Filled 2019-12-22 (×18): qty 5

## 2019-12-22 MED ORDER — DEXTROSE 50 % IV SOLN
INTRAVENOUS | Status: AC
  Administered 2019-12-22: 05:00:00 25 mL
  Filled 2019-12-22: qty 50

## 2019-12-22 MED ORDER — VITAL 1.5 CAL PO LIQD
1000.0000 mL | ORAL | Status: DC
  Administered 2019-12-22 – 2019-12-29 (×4): 1000 mL

## 2019-12-22 MED ORDER — DOCUSATE SODIUM 50 MG/5ML PO LIQD
100.0000 mg | Freq: Two times a day (BID) | ORAL | Status: DC
  Administered 2019-12-22 – 2020-01-02 (×19): 100 mg
  Filled 2019-12-22 (×19): qty 10

## 2019-12-22 MED ORDER — HEPARIN (PORCINE) 25000 UT/250ML-% IV SOLN
1150.0000 [IU]/h | INTRAVENOUS | Status: DC
  Administered 2019-12-22 – 2019-12-24 (×3): 1000 [IU]/h via INTRAVENOUS
  Administered 2019-12-25 – 2019-12-26 (×2): 1150 [IU]/h via INTRAVENOUS
  Filled 2019-12-22 (×5): qty 250

## 2019-12-22 MED ORDER — DEXTROSE 5 % IV SOLN: INTRAVENOUS | Status: DC

## 2019-12-22 MED ORDER — POTASSIUM PHOSPHATES 15 MMOLE/5ML IV SOLN
30.0000 mmol | Freq: Once | INTRAVENOUS | Status: AC
  Administered 2019-12-22: 12:00:00 30 mmol via INTRAVENOUS
  Filled 2019-12-22: qty 10

## 2019-12-22 MED ORDER — INSULIN ASPART 100 UNIT/ML ~~LOC~~ SOLN
0.0000 [IU] | SUBCUTANEOUS | Status: DC
  Administered 2019-12-22 – 2019-12-24 (×9): 1 [IU] via SUBCUTANEOUS
  Administered 2019-12-24 – 2019-12-25 (×3): 2 [IU] via SUBCUTANEOUS
  Administered 2019-12-25 (×3): 1 [IU] via SUBCUTANEOUS
  Administered 2019-12-26 (×2): 3 [IU] via SUBCUTANEOUS
  Administered 2019-12-26: 2 [IU] via SUBCUTANEOUS
  Administered 2019-12-26 – 2019-12-27 (×4): 1 [IU] via SUBCUTANEOUS
  Administered 2019-12-27 (×2): 2 [IU] via SUBCUTANEOUS
  Administered 2019-12-27 – 2019-12-28 (×2): 1 [IU] via SUBCUTANEOUS
  Administered 2019-12-28: 01:00:00 2 [IU] via SUBCUTANEOUS
  Administered 2019-12-28: 16:00:00 3 [IU] via SUBCUTANEOUS
  Administered 2019-12-28 – 2019-12-29 (×2): 2 [IU] via SUBCUTANEOUS
  Administered 2019-12-29: 03:00:00 0 [IU] via SUBCUTANEOUS
  Administered 2019-12-29 (×3): 1 [IU] via SUBCUTANEOUS
  Administered 2019-12-29: 3 [IU] via SUBCUTANEOUS
  Administered 2019-12-30: 4 [IU] via SUBCUTANEOUS
  Administered 2019-12-30: 04:00:00 3 [IU] via SUBCUTANEOUS
  Administered 2019-12-30 (×2): 2 [IU] via SUBCUTANEOUS

## 2019-12-22 MED ORDER — DEXTROSE 10 % IV SOLN: INTRAVENOUS | Status: DC

## 2019-12-22 MED ORDER — HEPARIN BOLUS VIA INFUSION
3000.0000 [IU] | Freq: Once | INTRAVENOUS | Status: AC
  Administered 2019-12-22: 18:00:00 3000 [IU] via INTRAVENOUS
  Filled 2019-12-22: qty 3000

## 2019-12-22 NOTE — Progress Notes (Signed)
eLink Physician-Brief Progress Note Patient Name: Henry [name] DOB: 22-Mar-1956 MRN: [id number]   Date of Service  12/22/2019  HPI/Events of Note  Hypoglycemia - Blood glucose = 65. Feeding tube clogged. Received Levemir 17 units yesterday.  eICU Interventions  Will order: 1. D10W to run IV at 30 mL/hour.      Intervention Category Major Interventions: Other:  Lenell Antu 12/22/2019, 4:43 AM

## 2019-12-22 NOTE — Progress Notes (Signed)
.   NAMEHolley Wirt, MRN:  [id number], DOB:  [birth year], LOS: 20 ADMISSION DATE:  11/30/2019, CONSULTATION DATE:  12/31 REFERRING MD:  Joseph Art, CHIEF COMPLAINT:  Progressive respiratory failure    Brief History   64 year old non-English-speaking male admitted on 12/17 with Covid pneumonia, transferred to the intensive care on 12/31 with acute decompensation  From time of admission to 12/31 Completed 5 days of remdesivir Completed 10 days of systemic steroids Transfused with convalescent plasma Received Actemra Never able to titrate off high flow oxygen Empiric antibiotics initiated on the 27th for presumptive aspiration  Past Medical History  Type 2 diabetes poorly controlled Spanish-speaking only   Significant Hospital Events    12/17 admitted.  Started on remdesivir, and systemic steroids 12/20 transferred out of ICU to PCU 12/21 received 1 unit of convalescent plasma, Actemra x1; still needing high flow oxygen  12/27 Asymmetric airspace disease --> presumptive aspiration event. Unasyn started.  12/31: Progressive worsening of work of breathing, saturations in 70s with respiratory rate in the 40s still on high flow oxygen at 40 L transferred emergently to intensive care intubated initial plateau pressure 52 1/1: Required norepinephrine, had decreased PaO2 while proned. FiO2 0.90, PEEP 12 1/4: vt decreased to 6 cc/kg-->pplat 32.  1/5: Plateau pressures still 34.  Requiring frequent hourly neuromuscular blockade, neuromuscular drip infusion reinitiated 1/6: Hypoglycemic overnight requiring D10 infusion, P/F ratio remains less than 150 so proning protocol continued.  Placed in the supine position early so new core track could be placed, although one was clogged off. Consults:  Critical care consulted 12/31 Speech-language pathology  Procedures:  Oral endotracheal tube 12/31>>> Right IJ triple-lumen catheter 12/31>>> Radial A-line 12/31>>  Significant Diagnostic Tests:  CTA  chest 12/18 >>1. No pulmonary embolus. 2. Progressive bilateral lung opacities from radiograph yesterday. Multifocal ground-glass opacities with superimposed consolidations in the dependent lower lobes with air bronchograms. Pattern consistent with COVID-19 pneumonia, superimposed bacterial infection in the lower lobes is also considered. 3. Coronary artery calcifications. Echocardiogram 1/1 >> LVEF 60-65%, grade 1 diastolic dysfunction, moderately elevated PASP with globally normal RV function  Micro Data:  12/17 point of care coronavirus positive Blood 12/17 >> negative MRSA screen 12/18 >> negative Resp culture 12/31 >> Moderate Serratia Marcecens, susceptibilities pending   Antimicrobials:  unasyn 12/27>>12/31 Cefepime 12/31>>> vanc 12/31>>> 1/2 Remdesivir from 12/27 through 12/31 (completed 5 days)  Interim history/subjective:  Hypoglycemic last night Objective   Blood pressure (Abnormal) 80/57, pulse 72, temperature 99.1 F (37.3 C), temperature source Oral, resp. rate (Abnormal) 35, height 5\' 5"  (1.651 m), weight 71.5 kg, SpO2 94 %. CVP:  [6 mmHg-71 mmHg] 50 mmHg  Vent Mode: PRVC FiO2 (%):  [80 %-100 %] 100 % Set Rate:  [35 bmp] 35 bmp Vt Set:  [370 mL-440 mL] 440 mL PEEP:  [12 cmH20-14 cmH20] 12 cmH20 Plateau Pressure:  [25 cmH20-35 cmH20] 34 cmH20   Intake/Output Summary (Last 24 hours) at 12/22/2019 0843 Last data filed at 12/22/2019 0600 Gross per 24 hour  Intake 651.5 ml  Output 2340 ml  Net -1688.5 ml   Filed Weights   12/20/19 0500 12/21/19 0500 12/22/19 0409  Weight: 70.4 kg 69.9 kg 71.5 kg    Examination: General 64 year old male patient who remains mechanically ventilated requiring high PEEP and FiO2 HEENT normocephalic atraumatic orally intubated Pulmonary diminished throughout equal chest rise bilaterally Ventilator assessment: PEEP:12 FiO2:80 Plateau pressure:42 Driving pressure: 28 Cardiac regular rate and rhythm  Abdomen soft, tolerating tube  feeds Extremities are warm  and dry with brisk capillary refill Neuro heavily sedated on neuromuscular blockade    Resolved Hospital Problem list   Multifactorial shock off pressors as of 1/5  Assessment & Plan:  Acute hypoxic respiratory failure secondary to Covid pneumonia with ARDS, further complicated by HCAP (Serratia Marcescens) -Ongoing concern for dysphagia documented No significant improvement in regards to oxygen requirements Plan: We will continue low tidal volume ventilation at 6 mL/kg predicted body weight Permissive respiratory acidosis as long as pH greater than 7.2 Continue to titrate PEEP and FiO2 with target plateau pressure less than 30 and target driving pressure less than 15 Target PaO2 55 to 65% We will continue diuresis as long as BUN and creatinine will allow he continues to have a positive fluid balance of over 4 L Continuing his second round of neuromuscular blockade infusion, RASS goal negative for, added oxycodone and clonazepam VAP bundle Day #7 of 7 antibiotics  Fluid and electrolyte imbalance: Mild hypernatremia, hypophosphatemia Plan Continue free water to 50 mL every 4 hours via tube Add D5 water at 50 cc an hour Replace potassium phosphate  Diabetes type 2 With hyperglycemia, more recently hypoglycemia Plan Discontinue Levemir altogether Sliding scale insulin only for now   Mild thrombocytopenia Platelets holding around 77/76 Plan Continue to trend CBC  protein calorie malnutrition in setting of prolonged critical illness Plan Continue tube feeds    Best practice:  Diet: Tube feeds Pain/Anxiety/Delirium protocol (if indicated): Versed, fentanyl VAP protocol (if indicated): 12/31 DVT prophylaxis: Low molecular weight heparin GI prophylaxis: PPI Glucose control: Sliding scale insulin Mobility: Bedrest Code Status: Full code Family Communication:  Glenard Haring (family friend->speaking to family in [place] for [place]) Disposition: Critical care  ICU.  Little progress with little more to offer other than time we will continue current plan of care.  Will update family.    Critical care time: 32  minutes  CRITICAL CARE Performed by: [name]

## 2019-12-22 NOTE — Progress Notes (Signed)
Patient was supined at 2030, tolerated well. Patient remained HDS, HR 80-90, MAP>65. Patient had hypoglycemic episode (65) which was treated with 12.5g of D50, and D10 was started at 30/hr.  Recheck was 135. Patient was placed back in the prone position at 0500 for poor ABG results. Patient tolerated well. Holding all CHG products for suspected allergy. Patient remains RASS -5, and paralyzed on continuous nimbex drip.

## 2019-12-22 NOTE — Procedures (Signed)
Cortrak  Person Inserting Tube:  Lady Wisham C, RD Tube Type:  Cortrak - 43 inches Tube Location:  Left nare Initial Placement:  Postpyloric Secured by: Bridle Technique Used to Measure Tube Placement:  Documented cm marking at nare/ corner of mouth Cortrak Secured At:  76 cm    Cortrak Tube Team Note:  Consult received to place a Cortrak feeding tube.   No x-ray is required. RN may begin using tube.   If the tube becomes dislodged please keep the tube and contact the Cortrak team at [email or web address] ([email or web address]) for replacement.  If after hours and replacement cannot be delayed, place a NG tube and confirm placement with an abdominal x-ray.    Donel Osowski RD, LDN, CNSC [phone] Pager [phone] After Hours Pager   

## 2019-12-22 NOTE — Progress Notes (Signed)
ANTICOAGULATION CONSULT NOTE - Follow Up Consult  Pharmacy Consult for Heparin Indication: Arterial thrombus  Not on File  Patient Measurements: Height: 5\' 5"  (165.1 cm) Weight: 157 lb 10.1 oz (71.5 kg) IBW/kg (Calculated) : 61.5  Vital Signs: Temp: 96.7 F (35.9 C) (01/06 1600) Temp Source: Axillary (01/06 1600) BP: 120/63 (01/06 1507) Pulse Rate: 64 (01/06 1507)  Labs: Recent Labs    12/20/19 0505 12/20/19 1225 12/21/19 0600 12/21/19 2152 12/22/19 0500 12/22/19 1429  HGB 13.1  --  13.6 13.9 12.6* 12.2*  HCT 43.4  --  46.1 41.0 41.9 36.0*  PLT 84*  --  77*  --  76*  --   CREATININE  --  0.39* 0.53*  --  0.41*  --     Estimated Creatinine Clearance: 83.3 mL/min (A) (by C-G formula based on SCr of 0.41 mg/dL (L)).  Assessment: 64 year old male to begin heparin for arterial thrombus Received last dose of Lovenox at 8 am  Goal of Therapy:  Heparin level 0.3-0.7 units/ml Monitor platelets by anticoagulation protocol: Yes   Plan:  Heparin 3000 units iv bolus x 1 Heparin drip at 1000 units / hr Heparin level in 8 hours Daily heparin level, CBC  Thank you 68, PharmD  12/22/2019,4:50 PM

## 2019-12-22 NOTE — Progress Notes (Signed)
RT NOTE:  Pt proned at this time. ETT taped and secured @ 24cm w/ cloth tape. Foam padding placed on cheeks and upper lip. Head turned to the right.

## 2019-12-22 NOTE — Progress Notes (Signed)
Hypoglycemic Event  CBG: 65  Treatment: 12.5g D50, D10@30   Symptoms: none  Follow-up CBG: Time: 0530 CBG Result:135  Possible Reasons for Event:  TF off  Comments/MD notified: Dr. Christianne Borrow

## 2019-12-22 NOTE — Progress Notes (Signed)
 Nutrition Follow-up  DOCUMENTATION CODES:   Not applicable  INTERVENTION:   Tube Feeding: Change to Vital 1.5 at 40 ml/hr Pro-Stat 60 mL TID Provides 2040 kcals, 155 g of protein and 730 mL of free water  Add MVI with Minerals   NUTRITION DIAGNOSIS:   Inadequate oral intake related to inability to eat as evidenced by NPO status.  Being addressed via TF   GOAL:   Patient will meet greater than or equal to 90% of their needs  Progressing  MONITOR:   Diet advancement, Labs, Weight trends  REASON FOR ASSESSMENT:   Consult, Ventilator Enteral/tube feeding initiation and management  ASSESSMENT:   64 year old male who presented to the ED on 12/17 with SOB and abdominal pain. No known PMH. Pt tested positive for COVID-19.  12/17 Admit 12/23 Cortrak placed for poor po, Nocturnal TF 12/27 ?aspiration event 12/31 Intubated 01/05 TF on hold due to misplaced Cortrak 01/06 Cortrak re-placed, post-pyloric  RD working remotely.  Patient is currently intubated on ventilator support; pt has been requiring prone positioning and deep sedation and paralytics MV: 15.8 L/min Temp (24hrs), Avg:97.9 F (36.6 C), Min:96.3 F (35.7 C), Max:99.1 F (37.3 C)  Noted hypoglycemic events; D5 at 50 ml/hr, noted TF had been on hold for Cortrak malpositioning  Glucerna 1.5 at 40 ml/hr; Pro-Stat 60 mL BID; free water 250 mL q 4 hours  No BM since 12/29; noted colace and senokot BID started today, miralax BID started Monday  Noted newly charted stage II pressure injury  Labs: sodium wdl, phosphorus 1.9 (L), CBGs 65-135 Meds: lasix, ss novolog, potassium phosphphate  Diet Order:   Diet Order    None      EDUCATION NEEDS:   No education needs have been identified at this time  Skin:  Skin Assessment: Reviewed RN Assessment  Last BM:  12/29  Height:   Ht Readings from Last 1 Encounters:  12/04/19 5\' 5"  (1.651 m)    Weight:   Wt Readings from Last 1 Encounters:   12/22/19 71.5 kg    Ideal Body Weight:  57 kg  BMI:  Body mass index is 26.23 kg/m.  Estimated Nutritional Needs:   Kcal:  2200-2400  Protein:  115-140 g  Fluid:  >/= 2.0 L    Henry Cookston MS, RDN, LDN, CNSC [phone] Pager  [phone] Weekend/On-Call Pager

## 2019-12-22 NOTE — Progress Notes (Signed)
MD to bedside to evaluate decreased temperature to left lower extremity. Doppler pulses at pedal and tibial sites. Also noted potential EKG changes, EKG exported to chart.

## 2019-12-23 ENCOUNTER — Encounter (HOSPITAL_COMMUNITY): Payer: Self-pay

## 2019-12-23 ENCOUNTER — Other Ambulatory Visit: Payer: Self-pay

## 2019-12-23 LAB — POCT I-STAT 7, (LYTES, BLD GAS, ICA,H+H)
Acid-Base Excess: 12 mmol/L — ABNORMAL HIGH (ref 0.0–2.0)
Acid-Base Excess: 10 mmol/L — ABNORMAL HIGH (ref 0.0–2.0)
Bicarbonate: 40.1 mmol/L — ABNORMAL HIGH (ref 20.0–28.0)
Bicarbonate: 39 mmol/L — ABNORMAL HIGH (ref 20.0–28.0)
Calcium, Ion: 1.07 mmol/L — ABNORMAL LOW (ref 1.15–1.40)
Calcium, Ion: 1.08 mmol/L — ABNORMAL LOW (ref 1.15–1.40)
HCT: 40 % (ref 39.0–52.0)
HCT: 39 % (ref 39.0–52.0)
Hemoglobin: 13.6 g/dL (ref 13.0–17.0)
Hemoglobin: 13.3 g/dL (ref 13.0–17.0)
O2 Saturation: 91 %
O2 Saturation: 90 %
Patient temperature: 97.6
Patient temperature: 97.8
Potassium: 3.5 mmol/L (ref 3.5–5.1)
Potassium: 3.7 mmol/L (ref 3.5–5.1)
Sodium: 144 mmol/L (ref 135–145)
Sodium: 143 mmol/L (ref 135–145)
TCO2: 42 mmol/L — ABNORMAL HIGH (ref 22–32)
TCO2: 41 mmol/L — ABNORMAL HIGH (ref 22–32)
pCO2 arterial: 66.4 mmHg (ref 32.0–48.0)
pCO2 arterial: 70.8 mmHg (ref 32.0–48.0)
pH, Arterial: 7.387 (ref 7.350–7.450)
pH, Arterial: 7.346 — ABNORMAL LOW (ref 7.350–7.450)
pO2, Arterial: 64 mmHg — ABNORMAL LOW (ref 83.0–108.0)
pO2, Arterial: 64 mmHg — ABNORMAL LOW (ref 83.0–108.0)

## 2019-12-23 LAB — CBC WITH DIFFERENTIAL/PLATELET
Abs Immature Granulocytes: 0.02 10*3/uL (ref 0.00–0.07)
Basophils Absolute: 0 10*3/uL (ref 0.0–0.1)
Basophils Relative: 0 %
Eosinophils Absolute: 0.3 10*3/uL (ref 0.0–0.5)
Eosinophils Relative: 11 %
HCT: 41.4 % (ref 39.0–52.0)
Hemoglobin: 12.4 g/dL — ABNORMAL LOW (ref 13.0–17.0)
Immature Granulocytes: 1 %
Lymphocytes Relative: 20 %
Lymphs Abs: 0.5 10*3/uL — ABNORMAL LOW (ref 0.7–4.0)
MCH: 29.1 pg (ref 26.0–34.0)
MCHC: 30 g/dL (ref 30.0–36.0)
MCV: 97.2 fL (ref 80.0–100.0)
Monocytes Absolute: 0.1 10*3/uL (ref 0.1–1.0)
Monocytes Relative: 6 %
Neutro Abs: 1.5 10*3/uL — ABNORMAL LOW (ref 1.7–7.7)
Neutrophils Relative %: 62 %
Platelets: 80 10*3/uL — ABNORMAL LOW (ref 150–400)
RBC: 4.26 MIL/uL (ref 4.22–5.81)
RDW: 15.8 % — ABNORMAL HIGH (ref 11.5–15.5)
WBC: 2.4 10*3/uL — ABNORMAL LOW (ref 4.0–10.5)
nRBC: 0 % (ref 0.0–0.2)

## 2019-12-23 LAB — FERRITIN: Ferritin: 167 ng/mL (ref 24–336)

## 2019-12-23 LAB — BASIC METABOLIC PANEL
Anion gap: 9 (ref 5–15)
BUN: 16 mg/dL (ref 8–23)
CO2: 37 mmol/L — ABNORMAL HIGH (ref 22–32)
Calcium: 7.3 mg/dL — ABNORMAL LOW (ref 8.9–10.3)
Chloride: 96 mmol/L — ABNORMAL LOW (ref 98–111)
Creatinine, Ser: <0.3 mg/dL — ABNORMAL LOW (ref 0.61–1.24)
Glucose, Bld: 166 mg/dL — ABNORMAL HIGH (ref 70–99)
Potassium: 3.7 mmol/L (ref 3.5–5.1)
Sodium: 142 mmol/L (ref 135–145)

## 2019-12-23 LAB — GLUCOSE, CAPILLARY
Glucose-Capillary: 140 mg/dL — ABNORMAL HIGH (ref 70–99)
Glucose-Capillary: 148 mg/dL — ABNORMAL HIGH (ref 70–99)
Glucose-Capillary: 151 mg/dL — ABNORMAL HIGH (ref 70–99)
Glucose-Capillary: 151 mg/dL — ABNORMAL HIGH (ref 70–99)
Glucose-Capillary: 159 mg/dL — ABNORMAL HIGH (ref 70–99)
Glucose-Capillary: 160 mg/dL — ABNORMAL HIGH (ref 70–99)
Glucose-Capillary: 173 mg/dL — ABNORMAL HIGH (ref 70–99)

## 2019-12-23 LAB — TROPONIN I (HIGH SENSITIVITY)
Troponin I (High Sensitivity): 6 ng/L (ref <18)
Troponin I (High Sensitivity): 6 ng/L (ref <18)

## 2019-12-23 LAB — MAGNESIUM: Magnesium: 2.1 mg/dL (ref 1.7–2.4)

## 2019-12-23 LAB — HEPARIN LEVEL (UNFRACTIONATED)
Heparin Unfractionated: 0.52 IU/mL (ref 0.30–0.70)
Heparin Unfractionated: 0.39 IU/mL (ref 0.30–0.70)

## 2019-12-23 LAB — PHOSPHORUS: Phosphorus: 2.6 mg/dL (ref 2.5–4.6)

## 2019-12-23 LAB — C-REACTIVE PROTEIN: CRP: 3.5 mg/dL — ABNORMAL HIGH (ref <1.0)

## 2019-12-23 MED ORDER — FUROSEMIDE 10 MG/ML IJ SOLN
40.0000 mg | Freq: Two times a day (BID) | INTRAMUSCULAR | Status: AC
  Administered 2019-12-23 (×2): 40 mg via INTRAVENOUS
  Filled 2019-12-23 (×2): qty 4

## 2019-12-23 MED ORDER — POTASSIUM CHLORIDE 20 MEQ/15ML (10%) PO SOLN
40.0000 meq | Freq: Two times a day (BID) | ORAL | Status: AC
  Administered 2019-12-23 (×2): 40 meq
  Filled 2019-12-23 (×2): qty 30

## 2019-12-23 MED ORDER — METOPROLOL TARTRATE 25 MG/10 ML ORAL SUSPENSION: 25.0000 mg | Freq: Two times a day (BID) | ORAL | Status: DC

## 2019-12-23 MED ORDER — METOPROLOL TARTRATE 25 MG/10 ML ORAL SUSPENSION
12.5000 mg | Freq: Two times a day (BID) | ORAL | Status: DC
  Filled 2019-12-23: qty 10

## 2019-12-23 MED ORDER — ASPIRIN 81 MG PO CHEW
81.0000 mg | CHEWABLE_TABLET | Freq: Every day | ORAL | Status: DC
  Administered 2019-12-23 – 2020-01-03 (×11): 81 mg
  Filled 2019-12-23 (×11): qty 1

## 2019-12-23 MED ORDER — METOPROLOL TARTRATE 25 MG/10 ML ORAL SUSPENSION
25.0000 mg | Freq: Two times a day (BID) | ORAL | Status: DC
  Administered 2019-12-23 – 2019-12-24 (×3): 25 mg
  Filled 2019-12-23 (×2): qty 10

## 2019-12-23 NOTE — Progress Notes (Addendum)
.   NAMEJaquil [name], MRN:  [id number], DOB:  August 05, 1956, LOS: 21 ADMISSION DATE:  11/28/2019, CONSULTATION DATE:  12/31 REFERRING MD:  Sherral Hammers, CHIEF COMPLAINT:  Progressive respiratory failure    Brief History   64 year old non-English-speaking male admitted on 12/17 with Covid pneumonia, transferred to the intensive care on 12/31 with acute decompensation  From time of admission to 12/31 Completed 5 days of remdesivir Completed 10 days of systemic steroids Transfused with convalescent plasma Received Actemra Never able to titrate off high flow oxygen Empiric antibiotics initiated on the 27th for presumptive aspiration  Past Medical History  Type 2 diabetes poorly controlled Spanish-speaking only   Significant Hospital Events    12/17 admitted.  Started on remdesivir, and systemic steroids 12/20 transferred out of ICU to PCU 12/21 received 1 unit of convalescent plasma, Actemra x1; still needing high flow oxygen  12/27 Asymmetric airspace disease --> presumptive aspiration event. Unasyn started.  12/31: Progressive worsening of work of breathing, saturations in 70s with respiratory rate in the 40s still on high flow oxygen at 40 L transferred emergently to intensive care intubated initial plateau pressure 52 1/1: Required norepinephrine, had decreased PaO2 while proned. FiO2 0.90, PEEP 12 1/4: vt decreased to 6 cc/kg-->pplat 32.  1/5: Plateau pressures still 34.  Requiring frequent hourly neuromuscular blockade, neuromuscular drip infusion reinitiated 1/6: Hypoglycemic overnight requiring D10 infusion, P/F ratio remains less than 150 so proning protocol continued.  Placed in the supine position early so new core track could be placed, although one was clogged off.  Left foot cold to palpation however did have strong posterior pedal and tibial pulses.  Empiric IV heparin started given concern for potential reduced arterial flow.  There is borderline ST elevation in lateral leads  twelve-lead EKG, given maximized oxygen and PEEP requirements not a candidate for any intervention at this point.  Echocardiogram and cardiac enzymes ordered Consults:  Critical care consulted 12/31 Speech-language pathology  Procedures:  Oral endotracheal tube 12/31>>> Right IJ triple-lumen catheter 12/31>>> Radial A-line 12/31>>  Significant Diagnostic Tests:  CTA chest 12/18 >>1. No pulmonary embolus. 2. Progressive bilateral lung opacities from radiograph yesterday. Multifocal ground-glass opacities with superimposed consolidations in the dependent lower lobes with air bronchograms. Pattern consistent with COVID-19 pneumonia, superimposed bacterial infection in the lower lobes is also considered. 3. Coronary artery calcifications. Echocardiogram 1/1 >> LVEF 92-42%, grade 1 diastolic dysfunction, moderately elevated PASP with globally normal RV function  Micro Data:  12/17 point of care coronavirus positive Blood 12/17 >> negative MRSA screen 12/18 >> negative Resp culture 12/31 >> Moderate Serratia Marcecens, susceptibilities pending   Antimicrobials:  unasyn 12/27>>12/31 Cefepime 12/31>>> vanc 12/31>>> 1/2 Remdesivir from 12/27 through 12/31 (completed 5 days)  Interim history/subjective:  Hypoglycemic last night Objective   Blood pressure 110/77, pulse 66, temperature 97.8 F (36.6 C), temperature source Oral, resp. rate (Abnormal) 35, height 5\' 5"  (1.651 m), weight 71.5 kg, SpO2 96 %. CVP:  [13 mmHg-14 mmHg] 13 mmHg  Vent Mode: PRVC FiO2 (%):  [80 %-100 %] 90 % Set Rate:  [35 bmp] 35 bmp Vt Set:  [440 mL] 440 mL PEEP:  [12 cmH20-14 cmH20] 14 cmH20 Plateau Pressure:  [38 cmH20-42 cmH20] 39 cmH20   Intake/Output Summary (Last 24 hours) at 12/23/2019 0832 Last data filed at 12/23/2019 0700 Gross per 24 hour  Intake 2524.87 ml  Output 1050 ml  Net 1474.87 ml   Filed Weights   12/21/19 0500 12/22/19 0409 12/23/19 0453  Weight: 69.9 kg  71.5 kg 71.5 kg     Examination: General 64 year old male remains critically ill. Currently in the prone position HENT NCAT orally intubated.  Ventilator assessment: PEEP:14 FiO2:80 Plateau pressure:40 Driving pressure: 26 Card RRR sr ontele abd soft not tender Ext LLE now warm. Pulses palp some dependent edema Neuro heavily sedated and on NMB infusion    Resolved Hospital Problem list   Multifactorial shock off pressors as of 1/5 Serratia HCAP completed antibiotics on 1/6 Assessment & Plan:  Acute hypoxic respiratory failure secondary to Covid pneumonia with ARDS -Ongoing concern for dysphagia documented Ventilator mechanics remain very concerning, with ongoing elevated plateau pressures Plan: Continue low tidal volume ventilation at 6 mL/kg predicted body weight Excepting permissive hypercarbia given poor ventilator compliance Plateau pressure goal less than 30 and driving pressure goal less than 15, note we have been unable to achieve this Target PaO2 55 to 65% Continue IV diuresis Continue 24 hours more of neuromuscular blockade, note this is his second round, continuing IV sedating drips with supplemental oxycodone and clonazepam VAP bundle Repeat chest x-ray today Continuing proning protocol as long as P/F ratio less than 150 in the supine position  completing CTX today   Possible ST elevation MI, with mild elevation of lateral leads -Hemodynamically currently stable Plan Continue IV heparin Repeat twelve-lead today Add aspirin via tube Low-dose beta-blockade  Repeat echocardiogram I do not see him as a candidate for any sort of interventional cardiac evaluation given his respiratory requirements at this point  Cold left foot Did have decent Doppler signal Plan We will get arterial Doppler of left foot today  Intermittent fluid and electrolyte imbalance: total body overload (+6 liters) Plan Continue free water flushes 250 mL every 4 hours Reduce D5 water to 25 ml/hr Continue to  empirically replace potassium with diuresis   Diabetes type 2 With hyperglycemia, more recently hypoglycemia Plan Continue sliding scale insulin, more stable now that Levemir stopped  Mild thrombocytopenia Platelets holding  Plan Trend CBC  protein calorie malnutrition in setting of prolonged critical illness Plan Continue tube feeds    Best practice:  Diet: Tube feeds Pain/Anxiety/Delirium protocol (if indicated): Versed, fentanyl VAP protocol (if indicated): 12/31 DVT prophylaxis: Low molecular weight heparin GI prophylaxis: PPI Glucose control: Sliding scale insulin Mobility: Bedrest Code Status: Full code Family Communication:  Lawanna Kobus (family friend->speaking to family in [place] for [place]) Disposition: Critical care ICU.  Little progress with little more to offer other than time we will continue current plan of care.  Will update family.    Critical care time: 31  minutes  CRITICAL CARE Performed by: Shelby Mattocks

## 2019-12-23 NOTE — Clinical Social Work Note (Addendum)
Update 2:42pm Patient's son Henry [name] Patient's daughter in law Henry [name] Telephone number [phone]  CSW received email from patient experience requesting assistance from [facility] team to assist with getting patient's son here on an emergency visa from [place].   CSW spoke with patient's friend [name] [phone] who states that she will text CSW the patient's family contact information(name and number) so that CSW is able to collaborate with [name]([facility] intepreter) [phone] to try and get them here.   Ongoing. TOC will continue to follow and assist.

## 2019-12-23 NOTE — Progress Notes (Signed)
RT NOTE:  Pt's head turned to the right.  

## 2019-12-23 NOTE — Progress Notes (Signed)
RT NOTE:  Pt's head turned to the left 

## 2019-12-23 NOTE — Progress Notes (Signed)
Patient on nimbex. vitls stable and does not appear to be in distress. There are no BIS monitors available therefore, pt vitals will be monitored closely

## 2019-12-23 NOTE — Progress Notes (Signed)
VASCULAR LAB    Patient proned at 1200 12/23/19. Also waiting on clarification of order as there are no ABI machines available at [facility] and patient has Dopplerable/Palpable pulses per notes.       Patryk Conant, RVT 12/23/2019, 12:26 PM

## 2019-12-23 NOTE — Progress Notes (Signed)
Patient placed in supine position.  ETT secured.  No breakdown noted.  Patient tolerated well with no complications. 

## 2019-12-23 NOTE — Progress Notes (Signed)
ANTICOAGULATION CONSULT NOTE - Follow Up Consult  Pharmacy Consult for Heparin Indication: Arterial thrombus  Not on File  Patient Measurements: Height: 5\' 5"  (165.1 cm) Weight: 157 lb 10.1 oz (71.5 kg) IBW/kg (Calculated) : 61.5  Vital Signs: Temp: 97.8 F (36.6 C) (01/07 0400) Temp Source: Oral (01/07 0400) BP: 96/71 (01/07 0800) Pulse Rate: 66 (01/07 0845)  Labs: Recent Labs    12/21/19 0600 12/22/19 0500 12/22/19 1429 12/23/19 0430  HGB 13.6 12.6* 12.2* 12.4*  HCT 46.1 41.9 36.0* 41.4  PLT 77* 76*  --  80*  HEPARINUNFRC  --   --   --  0.52  CREATININE 0.53* 0.41*  --  <0.30*    CrCl cannot be calculated (This lab value cannot be used to calculate CrCl because it is not a number: <0.30).  Assessment: 64 year old male on IV heparin for arterial thrombus. Initial HL is therapeutic at 0.52 on IV heparin at 1000 units/hr. H/H low stable, Plt 80k   A repeat confirmatory level at noon remained therapeutic. No overt s/s of bleeding noted   Goal of Therapy:  Heparin level 0.3-0.7 units/ml Monitor platelets by anticoagulation protocol: Yes   Plan:  Continue heparin drip at 1000 units / hr Daily heparin level, CBC  68, PharmD., BCPS Clinical Pharmacist Clinical phone for 12/22/18 until 5pm: [phone]

## 2019-12-24 ENCOUNTER — Inpatient Hospital Stay (HOSPITAL_COMMUNITY): Payer: HRSA Program

## 2019-12-24 DIAGNOSIS — I10 Essential (primary) hypertension: Secondary | ICD-10-CM

## 2019-12-24 LAB — CBC WITH DIFFERENTIAL/PLATELET
Abs Immature Granulocytes: 0.03 10*3/uL (ref 0.00–0.07)
Basophils Absolute: 0 10*3/uL (ref 0.0–0.1)
Basophils Relative: 1 %
Eosinophils Absolute: 0.4 10*3/uL (ref 0.0–0.5)
Eosinophils Relative: 11 %
HCT: 41 % (ref 39.0–52.0)
Hemoglobin: 12.4 g/dL — ABNORMAL LOW (ref 13.0–17.0)
Immature Granulocytes: 1 %
Lymphocytes Relative: 19 %
Lymphs Abs: 0.6 10*3/uL — ABNORMAL LOW (ref 0.7–4.0)
MCH: 29.4 pg (ref 26.0–34.0)
MCHC: 30.2 g/dL (ref 30.0–36.0)
MCV: 97.2 fL (ref 80.0–100.0)
Monocytes Absolute: 0.2 10*3/uL (ref 0.1–1.0)
Monocytes Relative: 6 %
Neutro Abs: 2.1 10*3/uL (ref 1.7–7.7)
Neutrophils Relative %: 62 %
Platelets: 86 10*3/uL — ABNORMAL LOW (ref 150–400)
RBC: 4.22 MIL/uL (ref 4.22–5.81)
RDW: 16 % — ABNORMAL HIGH (ref 11.5–15.5)
WBC: 3.3 10*3/uL — ABNORMAL LOW (ref 4.0–10.5)
nRBC: 0 % (ref 0.0–0.2)

## 2019-12-24 LAB — BASIC METABOLIC PANEL
Anion gap: 7 (ref 5–15)
BUN: 17 mg/dL (ref 8–23)
CO2: 37 mmol/L — ABNORMAL HIGH (ref 22–32)
Calcium: 7.7 mg/dL — ABNORMAL LOW (ref 8.9–10.3)
Chloride: 98 mmol/L (ref 98–111)
Creatinine, Ser: 0.42 mg/dL — ABNORMAL LOW (ref 0.61–1.24)
GFR calc Af Amer: >60 mL/min (ref >60)
GFR calc non Af Amer: >60 mL/min (ref >60)
Glucose, Bld: 195 mg/dL — ABNORMAL HIGH (ref 70–99)
Potassium: 4.5 mmol/L (ref 3.5–5.1)
Sodium: 142 mmol/L (ref 135–145)

## 2019-12-24 LAB — HEPARIN LEVEL (UNFRACTIONATED): Heparin Unfractionated: 0.38 IU/mL (ref 0.30–0.70)

## 2019-12-24 LAB — POCT I-STAT 7, (LYTES, BLD GAS, ICA,H+H)
Acid-Base Excess: 15 mmol/L — ABNORMAL HIGH (ref 0.0–2.0)
Bicarbonate: 43.8 mmol/L — ABNORMAL HIGH (ref 20.0–28.0)
Calcium, Ion: 1.05 mmol/L — ABNORMAL LOW (ref 1.15–1.40)
HCT: 40 % (ref 39.0–52.0)
Hemoglobin: 13.6 g/dL (ref 13.0–17.0)
O2 Saturation: 86 %
Patient temperature: 97.5
Potassium: 4.4 mmol/L (ref 3.5–5.1)
Sodium: 141 mmol/L (ref 135–145)
TCO2: 46 mmol/L — ABNORMAL HIGH (ref 22–32)
pCO2 arterial: 69.7 mmHg (ref 32.0–48.0)
pH, Arterial: 7.403 (ref 7.350–7.450)
pO2, Arterial: 53 mmHg — ABNORMAL LOW (ref 83.0–108.0)

## 2019-12-24 LAB — GLUCOSE, CAPILLARY
Glucose-Capillary: 168 mg/dL — ABNORMAL HIGH (ref 70–99)
Glucose-Capillary: 138 mg/dL — ABNORMAL HIGH (ref 70–99)
Glucose-Capillary: 234 mg/dL — ABNORMAL HIGH (ref 70–99)
Glucose-Capillary: 231 mg/dL — ABNORMAL HIGH (ref 70–99)
Glucose-Capillary: 195 mg/dL — ABNORMAL HIGH (ref 70–99)

## 2019-12-24 LAB — FERRITIN: Ferritin: 159 ng/mL (ref 24–336)

## 2019-12-24 LAB — MAGNESIUM: Magnesium: 2.1 mg/dL (ref 1.7–2.4)

## 2019-12-24 LAB — PHOSPHORUS: Phosphorus: 2.8 mg/dL (ref 2.5–4.6)

## 2019-12-24 LAB — C-REACTIVE PROTEIN: CRP: 3.6 mg/dL — ABNORMAL HIGH (ref <1.0)

## 2019-12-24 MED ORDER — MAGNESIUM SULFATE 2 GM/50ML IV SOLN
2.0000 g | Freq: Once | INTRAVENOUS | Status: AC
  Administered 2019-12-24: 2 g via INTRAVENOUS
  Filled 2019-12-24: qty 50

## 2019-12-24 MED ORDER — HYDROMORPHONE BOLUS VIA INFUSION
0.5000 mg | INTRAVENOUS | Status: DC | PRN
  Administered 2019-12-26 – 2020-01-02 (×3): 0.5 mg via INTRAVENOUS
  Filled 2019-12-24: qty 1

## 2019-12-24 MED ORDER — FUROSEMIDE 10 MG/ML IJ SOLN
80.0000 mg | Freq: Three times a day (TID) | INTRAMUSCULAR | Status: AC
  Administered 2019-12-24 (×3): 80 mg via INTRAVENOUS
  Filled 2019-12-24 (×3): qty 8

## 2019-12-24 MED ORDER — SODIUM CHLORIDE 0.9 % IV SOLN
1.0000 mg/h | INTRAVENOUS | Status: DC
  Administered 2019-12-24: 2 mg/h via INTRAVENOUS
  Administered 2019-12-25 – 2019-12-28 (×7): 4 mg/h via INTRAVENOUS
  Administered 2019-12-28 – 2019-12-29 (×2): 3 mg/h via INTRAVENOUS
  Administered 2019-12-30 – 2020-01-04 (×10): 4 mg/h via INTRAVENOUS
  Filled 2019-12-24 (×28): qty 5

## 2019-12-24 MED ORDER — POTASSIUM CHLORIDE 20 MEQ/15ML (10%) PO SOLN
40.0000 meq | Freq: Two times a day (BID) | ORAL | Status: AC
  Administered 2019-12-24 (×2): 40 meq
  Filled 2019-12-24 (×2): qty 30

## 2019-12-24 MED ORDER — METOPROLOL TARTRATE 25 MG/10 ML ORAL SUSPENSION
50.0000 mg | Freq: Two times a day (BID) | ORAL | Status: DC
  Administered 2019-12-24 – 2020-01-02 (×17): 50 mg
  Filled 2019-12-24 (×18): qty 20

## 2019-12-24 MED ORDER — HYDRALAZINE HCL 25 MG PO TABS
50.0000 mg | ORAL_TABLET | Freq: Four times a day (QID) | ORAL | Status: DC
  Administered 2019-12-24 – 2020-01-02 (×25): 50 mg
  Filled 2019-12-24 (×27): qty 2

## 2019-12-24 MED ORDER — BISACODYL 10 MG RE SUPP
10.0000 mg | Freq: Once | RECTAL | Status: AC
  Administered 2019-12-24: 10 mg via RECTAL
  Filled 2019-12-24: qty 1

## 2019-12-24 NOTE — Progress Notes (Signed)
RT NOTE:  Pt's head turned to the left. ETT secure throughout.  

## 2019-12-24 NOTE — Progress Notes (Signed)
Pt prone at this time with no complications.  ETT taped and secured @ 25 with cloth tape.  Foam padding added to cheeks and upper lip. Head turned to the right.

## 2019-12-24 NOTE — Progress Notes (Addendum)
Wallet with debit card and cell phone with charger sent to security by [employer]. Wallet had no cash inside

## 2019-12-24 NOTE — Progress Notes (Addendum)
.   NAMELuay [name], MRN:  [id number], DOB:  1956/09/06, LOS: 33 ADMISSION DATE:  Dec 24, 2019, CONSULTATION DATE:  12/31 REFERRING MD:  Sherral Hammers, CHIEF COMPLAINT:  Progressive respiratory failure    Brief History   64 year old non-English-speaking male admitted on 12/17 with Covid pneumonia, transferred to the intensive care on 12/31 with acute decompensation  From time of admission to 12/31 Completed 5 days of remdesivir Completed 10 days of systemic steroids Transfused with convalescent plasma Received Actemra Never able to titrate off high flow oxygen Empiric antibiotics initiated on the 27th for presumptive aspiration  Past Medical History  Type 2 diabetes poorly controlled Spanish-speaking only   Significant Hospital Events    12/17 admitted.  Started on remdesivir, and systemic steroids 12/20 transferred out of ICU to PCU 12/21 received 1 unit of convalescent plasma, Actemra x1; still needing high flow oxygen  12/27 Asymmetric airspace disease --> presumptive aspiration event. Unasyn started.  12/31: Progressive worsening of work of breathing, saturations in 70s with respiratory rate in the 40s still on high flow oxygen at 40 L transferred emergently to intensive care intubated initial plateau pressure 52 1/1: Required norepinephrine, had decreased PaO2 while proned. FiO2 0.90, PEEP 12 1/4: vt decreased to 6 cc/kg-->pplat 32.  1/5: Plateau pressures still 34.  Requiring frequent hourly neuromuscular blockade, neuromuscular drip infusion reinitiated 1/6: Hypoglycemic overnight requiring D10 infusion, P/F ratio remains less than 150 so proning protocol continued.  Placed in the supine position early so new core track could be placed, although one was clogged off.  Left foot cold to palpation however did have strong posterior pedal and tibial pulses.  Empiric IV heparin started given concern for potential reduced arterial flow.  There is borderline ST elevation in lateral leads  twelve-lead EKG, given maximized oxygen and PEEP requirements not a candidate for any intervention at this point.  1/7: Cardiac enzymes negative.  Foot now warm. 1/8:added hydralazine and increased beta blocker  Consults:  Critical care consulted 12/31 Speech-language pathology  Procedures:  Oral endotracheal tube 12/31>>> Right IJ triple-lumen catheter 12/31>>> Radial A-line 12/31>>  Significant Diagnostic Tests:  CTA chest 12/18 >>1. No pulmonary embolus. 2. Progressive bilateral lung opacities from radiograph yesterday. Multifocal ground-glass opacities with superimposed consolidations in the dependent lower lobes with air bronchograms. Pattern consistent with COVID-19 pneumonia, superimposed bacterial infection in the lower lobes is also considered. 3. Coronary artery calcifications. Echocardiogram 1/1 >> LVEF 01-09%, grade 1 diastolic dysfunction, moderately elevated PASP with globally normal RV function  Micro Data:  12/17 point of care coronavirus positive Blood 12/17 >> negative MRSA screen 12/18 >> negative Resp culture 12/31 >> Moderate Serratia Marcecens, susceptibilities pending   Antimicrobials:  unasyn 12/27>>12/31 Cefepime 12/31>>> vanc 12/31>>> 1/2 Remdesivir from 12/27 through 12/31 (completed 5 days)  Interim history/subjective:  More hypertensive Objective   Blood pressure 104/72, pulse 78, temperature 98.5 F (36.9 C), temperature source Oral, resp. rate (Abnormal) 35, height 5\' 5"  (1.651 m), weight 71.3 kg, SpO2 93 %. CVP:  [4 mmHg-15 mmHg] 10 mmHg  Vent Mode: PRVC FiO2 (%):  [70 %-80 %] 70 % Set Rate:  [35 bmp] 35 bmp Vt Set:  [440 mL] 440 mL PEEP:  [14 cmH20] 14 cmH20 Plateau Pressure:  [42 cmH20-46 cmH20] 42 cmH20   Intake/Output Summary (Last 24 hours) at 12/24/2019 0815 Last data filed at 12/24/2019 0600 Gross per 24 hour  Intake 2661.69 ml  Output 2345 ml  Net 316.69 ml   Filed Weights   12/22/19 0409  12/23/19 0453 12/24/19 0500  Weight:  71.5 kg 71.5 kg 71.3 kg    Examination: General 64 year old male patient currently sedated and in the prone position HEENT orally intubated Pulmonary diminished bilaterally equal chest rise Ventilator assessment: PEEP:14 FiO2:60 Plateau pressure:50 Driving pressure: 36 Cardiac regular rate and rhythm Abdomen soft Extremities warm dry brisk cap refill pulses both palpable lower extremity edema Neuro heavily sedated GU clear yellow   Resolved Hospital Problem list   Multifactorial shock off pressors as of 1/5 Serratia HCAP completed antibiotics on 1/6 Assessment & Plan:  Acute hypoxic respiratory failure secondary to Covid pneumonia with ARDS -Ongoing concern for dysphagia documented  Plan: Continue low tidal volume ventilation at 46ml/per kilogram predicted body weight Excepting permissive hypercarbia given poor ventilator compliance Plateau pressure goal less than 30, driving pressure goal less than 15, we have been unable to achieve this goal since intubation Target PaO2 55 to 65% Continue aggressive diuresis as long as BUN/creatinine allow He has now status post another 72 hours of neuromuscular blockade, will try to transition him once again to as needed Continue heavy sedation with RASS goal negative for Continue via tube oxycodone and clonazepam VAP bundle Repeat chest x-ray today Continuing proning protocol as long as P/F ratio less than 150 in the supine position  Possible ST elevation MI, with mild elevation of lateral leads HTN -Hemodynamically currently stable, cardiac enzymes negative Plan Continue IV heparin for 24 more hours Continue aspirin via tube  Continue beta-blockade -->increase to 50 bid Add hydralazine vt   Cold left foot Did have decent Doppler signal, now warm with palpable pulse Plan Continue IV heparin  Intermittent fluid and electrolyte imbalance: total body overload  Sodium normalized with free water, BUN/creatinine remain stable, he  still 6.4 L positive Plan Continue IV diuresis, we will change to 80 every 8 hours x3 doses Replace potassium Empirically replace magnesium Check magnesium in a.m. with chemistry   Diabetes type 2 With hyperglycemia, more recently hypoglycemia Plan Continue sliding scale insulin, glycemic control better in regards to hypoglycemic events since stopping Levemir  Mild thrombocytopenia Platelets holding  Plan Plan CBC, platelets actually improving  protein calorie malnutrition in setting of prolonged critical illness Plan Continue tube feeds via postpyloric feeding tube   Best practice:  Diet: Tube feeds Pain/Anxiety/Delirium protocol (if indicated): Versed, fentanyl VAP protocol (if indicated): 12/31 DVT prophylaxis: Low molecular weight heparin GI prophylaxis: PPI Glucose control: Sliding scale insulin Mobility: Bedrest Code Status: Full code Family Communication:  Lawanna Kobus (family friend->speaking to family in [place] for [place]) Disposition: Critical care ICU.  Little progress with little more to offer other than time we will continue current plan of care.  Will update family.    Critical care time:  32 minutes  CRITICAL CARE Performed by: Shelby Mattocks

## 2019-12-24 NOTE — Progress Notes (Signed)
Pt placed supine at this time with no complications. ETT in proper position secured with cloth tape.

## 2019-12-24 NOTE — Progress Notes (Signed)
The patient's contact was updated via phone She has updated the rest of the family.  At this point the family is deferring any decision making until at least over the weekend, they like to give him a little more time.    They would be open to recommendations from the medical staff if there is no improvement over the weekend, his oxygen requirements are less however his plateau pressures are worse.  This could take several weeks to improve assuming no other setbacks  Simonne Martinet ACNP-BC [facility] Pulmonary/Critical Care Pager # [phone] OR # [phone] if no answer

## 2019-12-24 NOTE — Progress Notes (Signed)
Family Update: I was contacted by one of this patient's family members who had requested updates. I had given the family member updates and answered all questions.

## 2019-12-24 NOTE — Clinical Social Work Note (Signed)
Letter written and MD signature obtained asking for exemption to allow patient's son to travel to the [place] to see patient.   CSW called and informed [name]. Had to leave VM.

## 2019-12-24 NOTE — Progress Notes (Signed)
ANTICOAGULATION CONSULT NOTE - Follow Up Consult  Pharmacy Consult for Heparin Indication: Arterial thrombus  Not on File  Patient Measurements: Height: 5\' 5"  (165.1 cm) Weight: 157 lb 3 oz (71.3 kg) IBW/kg (Calculated) : 61.5  Vital Signs: Temp: 98.5 F (36.9 C) (01/08 0700) Temp Source: Oral (01/08 0700) BP: 104/72 (01/08 0700) Pulse Rate: 78 (01/08 0700)  Labs: Recent Labs    12/22/19 0500 12/23/19 0430 12/23/19 1112 12/23/19 1128 12/23/19 1537 12/23/19 1748 12/24/19 0440  HGB 12.6* 12.4*  --  13.6  --  13.3 12.4*  HCT 41.9 41.4  --  40.0  --  39.0 41.0  PLT 76* 80*  --   --   --   --  86*  HEPARINUNFRC  --  0.52 0.39  --   --   --  0.38  CREATININE 0.41* <0.30*  --   --   --   --  0.42*  TROPONINIHS  --   --  6  --  6  --   --     Estimated Creatinine Clearance: 83.3 mL/min (A) (by C-G formula based on SCr of 0.42 mg/dL (L)).  Assessment: 64 year old male on IV heparin for arterial thrombus. AM heparin level remains therapeutic at 0.38. H/H stable. Plt low but stable.   Goal of Therapy:  Heparin level 0.3-0.7 units/ml Monitor platelets by anticoagulation protocol: Yes   Plan:  Continue heparin drip at 1000 units / hr Daily heparin level, CBC  68, PharmD., BCPS Clinical Pharmacist Clinical phone for 12/23/18 until 5pm: [phone]

## 2019-12-25 LAB — CBC WITH DIFFERENTIAL/PLATELET
Abs Immature Granulocytes: 0.08 10*3/uL — ABNORMAL HIGH (ref 0.00–0.07)
Basophils Absolute: 0 10*3/uL (ref 0.0–0.1)
Basophils Relative: 1 %
Eosinophils Absolute: 0.3 10*3/uL (ref 0.0–0.5)
Eosinophils Relative: 7 %
HCT: 43.9 % (ref 39.0–52.0)
Hemoglobin: 13.3 g/dL (ref 13.0–17.0)
Immature Granulocytes: 2 %
Lymphocytes Relative: 17 %
Lymphs Abs: 0.8 10*3/uL (ref 0.7–4.0)
MCH: 29.7 pg (ref 26.0–34.0)
MCHC: 30.3 g/dL (ref 30.0–36.0)
MCV: 98 fL (ref 80.0–100.0)
Monocytes Absolute: 0.3 10*3/uL (ref 0.1–1.0)
Monocytes Relative: 5 %
Neutro Abs: 3.5 10*3/uL (ref 1.7–7.7)
Neutrophils Relative %: 68 %
Platelets: 103 10*3/uL — ABNORMAL LOW (ref 150–400)
RBC: 4.48 MIL/uL (ref 4.22–5.81)
RDW: 16.6 % — ABNORMAL HIGH (ref 11.5–15.5)
WBC: 5 10*3/uL (ref 4.0–10.5)
nRBC: 0 % (ref 0.0–0.2)

## 2019-12-25 LAB — C-REACTIVE PROTEIN: CRP: 3.9 mg/dL — ABNORMAL HIGH (ref <1.0)

## 2019-12-25 LAB — GLUCOSE, CAPILLARY
Glucose-Capillary: 123 mg/dL — ABNORMAL HIGH (ref 70–99)
Glucose-Capillary: 140 mg/dL — ABNORMAL HIGH (ref 70–99)
Glucose-Capillary: 148 mg/dL — ABNORMAL HIGH (ref 70–99)
Glucose-Capillary: 195 mg/dL — ABNORMAL HIGH (ref 70–99)
Glucose-Capillary: 196 mg/dL — ABNORMAL HIGH (ref 70–99)
Glucose-Capillary: 197 mg/dL — ABNORMAL HIGH (ref 70–99)
Glucose-Capillary: 221 mg/dL — ABNORMAL HIGH (ref 70–99)

## 2019-12-25 LAB — FERRITIN: Ferritin: 157 ng/mL (ref 24–336)

## 2019-12-25 LAB — MAGNESIUM: Magnesium: 2.2 mg/dL (ref 1.7–2.4)

## 2019-12-25 LAB — HEPARIN LEVEL (UNFRACTIONATED)
Heparin Unfractionated: 0.23 IU/mL — ABNORMAL LOW (ref 0.30–0.70)
Heparin Unfractionated: 0.33 IU/mL (ref 0.30–0.70)

## 2019-12-25 LAB — PHOSPHORUS: Phosphorus: 2.7 mg/dL (ref 2.5–4.6)

## 2019-12-25 NOTE — Progress Notes (Signed)
ANTICOAGULATION CONSULT NOTE - Follow Up Consult  Pharmacy Consult for Heparin Indication: Arterial thrombus  Not on File  Patient Measurements: Height: 5\' 5"  (165.1 cm) Weight: 155 lb 10.3 oz (70.6 kg) IBW/kg (Calculated) : 61.5  Vital Signs: Temp: 96.6 F (35.9 C) (01/09 0801) Temp Source: Esophageal (01/09 0801) BP: 95/66 (01/09 0800) Pulse Rate: 72 (01/09 0800)  Labs: Recent Labs    12/23/19 0430 12/23/19 0430 12/23/19 1112 12/23/19 1537 12/24/19 0440 12/24/19 1736 12/25/19 0430  HGB 12.4*   < >  --   --  12.4* 13.6 13.3  HCT 41.4   < >  --   --  41.0 40.0 43.9  PLT 80*  --   --   --  86*  --  103*  HEPARINUNFRC 0.52  --  0.39  --  0.38  --  0.23*  CREATININE <0.30*  --   --   --  0.42*  --   --   TROPONINIHS  --   --  6 6  --   --   --    < > = values in this interval not displayed.    Estimated Creatinine Clearance: 83.3 mL/min (A) (by C-G formula based on SCr of 0.42 mg/dL (L)).  Assessment: 64 year old male on IV heparin for arterial thrombus.   Heparin level this AM is subtherapeutic at 0.23. H/H wnl. Plt improving. No s/s of overt bleeding per RN   Goal of Therapy:  Heparin level 0.3-0.7 units/ml Monitor platelets by anticoagulation protocol: Yes   Plan:  Increase heparin drip to 1150 units / hr F/u 6 hr HL  Daily heparin level, CBC  68, PharmD., BCPS Clinical Pharmacist Clinical phone for 12/24/18 until 5pm: [phone]

## 2019-12-25 NOTE — Progress Notes (Signed)
ANTICOAGULATION CONSULT NOTE - Follow Up Consult  Pharmacy Consult for Heparin Indication: Arterial thrombus  Not on File  Patient Measurements: Height: 5\' 5"  (165.1 cm) Weight: 155 lb 10.3 oz (70.6 kg) IBW/kg (Calculated) : 61.5  Vital Signs: Temp: 100 F (37.8 C) (01/09 1900) Temp Source: Esophageal (01/09 1900) BP: 108/75 (01/09 1900) Pulse Rate: 86 (01/09 1900)  Labs: Recent Labs    12/23/19 0430 12/23/19 0430 12/23/19 1112 12/23/19 1537 12/24/19 0440 12/24/19 1736 12/25/19 0430 12/25/19 1825  HGB 12.4*   < >  --   --  12.4* 13.6 13.3  --   HCT 41.4   < >  --   --  41.0 40.0 43.9  --   PLT 80*  --   --   --  86*  --  103*  --   HEPARINUNFRC 0.52  --  0.39  --  0.38  --  0.23* 0.33  CREATININE <0.30*  --   --   --  0.42*  --   --   --   TROPONINIHS  --   --  6 6  --   --   --   --    < > = values in this interval not displayed.    Estimated Creatinine Clearance: 83.3 mL/min (A) (by C-G formula based on SCr of 0.42 mg/dL (L)).  Assessment: 64 year old male on IV heparin for arterial thrombus.   Heparin level this AM is subtherapeutic at 0.23. H/H wnl. Plt improving. No s/s of overt bleeding per RN   HL came back in range tonight at 0.33. We will continue rate and confirm level in AM.   Goal of Therapy:  Heparin level 0.3-0.7 units/ml Monitor platelets by anticoagulation protocol: Yes   Plan:  Continue heparin drip 1150 units / hr Daily heparin level, CBC  68, PharmD, BCIDP, AAHIVP, CPP Infectious Disease Pharmacist 12/25/2019 7:45 PM

## 2019-12-25 NOTE — Progress Notes (Signed)
.   NAMECrist [name], MRN:  [id number], DOB:  [birth year], LOS: 23 ADMISSION DATE:  12/09/2019, CONSULTATION DATE:  12/31 REFERRING MD:  Joseph Art, CHIEF COMPLAINT:  Progressive respiratory failure    Brief History   64 year old non-English-speaking male admitted on 12/17 with Covid pneumonia, transferred to the intensive care on 12/31 with acute decompensation  From time of admission to 12/31 Completed 5 days of remdesivir Completed 10 days of systemic steroids Transfused with convalescent plasma Received Actemra Never able to titrate off high flow oxygen Empiric antibiotics initiated on the 27th for presumptive aspiration  Past Medical History  Type 2 diabetes poorly controlled Spanish-speaking only   Significant Hospital Events    12/17 admitted.  Started on remdesivir, and systemic steroids 12/20 transferred out of ICU to PCU 12/21 received 1 unit of convalescent plasma, Actemra x1; still needing high flow oxygen  12/27 Asymmetric airspace disease --> presumptive aspiration event. Unasyn started.  12/31: Progressive worsening of work of breathing, saturations in 70s with respiratory rate in the 40s still on high flow oxygen at 40 L transferred emergently to intensive care intubated initial plateau pressure 52 1/1: Required norepinephrine, had decreased PaO2 while proned. FiO2 0.90, PEEP 12 1/4: vt decreased to 6 cc/kg-->pplat 32.  1/5: Plateau pressures still 34.  Requiring frequent hourly neuromuscular blockade, neuromuscular drip infusion reinitiated 1/6: Hypoglycemic overnight requiring D10 infusion, P/F ratio remains less than 150 so proning protocol continued.  Placed in the supine position early so new core track could be placed, although one was clogged off.  Left foot cold to palpation however did have strong posterior pedal and tibial pulses.  Empiric IV heparin started given concern for potential reduced arterial flow.  There is borderline ST elevation in lateral leads  twelve-lead EKG, given maximized oxygen and PEEP requirements not a candidate for any intervention at this point.  1/7: Cardiac enzymes negative.  Foot now warm. 1/8:added hydralazine and increased beta blocker  Consults:  Critical care consulted 12/31 Speech-language pathology  Procedures:  Oral endotracheal tube 12/31>>> Right IJ triple-lumen catheter 12/31>>> Radial A-line 12/31>>  Significant Diagnostic Tests:  CTA chest 12/18 >>1. No pulmonary embolus. 2. Progressive bilateral lung opacities from radiograph yesterday. Multifocal ground-glass opacities with superimposed consolidations in the dependent lower lobes with air bronchograms. Pattern consistent with COVID-19 pneumonia, superimposed bacterial infection in the lower lobes is also considered. 3. Coronary artery calcifications. Echocardiogram 1/1 >> LVEF 60-65%, grade 1 diastolic dysfunction, moderately elevated PASP with globally normal RV function  Micro Data:  12/17 point of care coronavirus positive Blood 12/17 >> negative MRSA screen 12/18 >> negative Resp culture 12/31 >> Moderate Serratia Marcecens, susceptibilities pending   Antimicrobials:  unasyn 12/27>>12/31 Cefepime 12/31>>> vanc 12/31>>> 1/2 Remdesivir from 12/27 through 12/31 (completed 5 days)  Interim history/subjective:  No acute events o/n.  Objective   Blood pressure 92/68, pulse 78, temperature (!) 96.6 F (35.9 C), temperature source Esophageal, resp. rate (!) 35, height 5\' 5"  (1.651 m), weight 70.6 kg, SpO2 92 %. CVP:  [4 mmHg-15 mmHg] 15 mmHg  Vent Mode: PRVC FiO2 (%):  [60 %-80 %] 80 % Set Rate:  [35 bmp] 35 bmp Vt Set:  [440 mL] 440 mL PEEP:  [14 cmH20] 14 cmH20 Plateau Pressure:  [42 cmH20-44 cmH20] 42 cmH20   Intake/Output Summary (Last 24 hours) at 12/25/2019 1143 Last data filed at 12/25/2019 0800 Gross per 24 hour  Intake 1861.15 ml  Output 3600 ml  Net -1738.85 ml   02/22/2020  12/23/19 0453 12/24/19 0500 12/25/19 0454   Weight: 71.5 kg 71.3 kg 70.6 kg    Examination: General 64 year old male patient currently sedated HEENT orally intubated Pulmonary Crackles throughout bilaterally Ventilator assessment: PEEP:14 FiO2:60 Plateau pressure:50 Driving pressure: 36 Cardiac regular rate and rhythm Abdomen soft Extremities warm dry brisk cap refill pulses both palpable lower extremity edema Neuro heavily sedated GU clear yellow   Resolved Hospital Problem list   Multifactorial shock off pressors as of 1/5 Serratia HCAP completed antibiotics on 1/6 Assessment & Plan:  Acute hypoxic respiratory failure secondary to Covid pneumonia with ARDS -Ongoing concern for dysphagia documented  Plan: Continue low tidal volume ventilation at 47ml/per kilogram predicted body weight Excepting permissive hypercarbia given poor ventilator compliance Plateau pressure goal less than 30, driving pressure goal less than 15, we have been unable to achieve this goal since intubation Target PaO2 55 to 65% Continue aggressive diuresis as long as BUN/creatinine allow He has now status post another 72 hours of neuromuscular blockade, will try to transition him once again to as needed Continue heavy sedation with RASS goal negative  Continue via tube oxycodone and clonazepam VAP bundle Repeat chest x-ray today Continuing proning protocol as long as P/F ratio less than 150 in the supine position  Possible ST elevation MI, with mild elevation of lateral leads HTN -Hemodynamically currently stable, cardiac enzymes negative Plan D/c heparin Continue aspirin via tube  Continue beta-blockade -->increase to 50 bid Hydralazine PRN   Cold left foot Did have decent Doppler signal, now warm with palpable pulse Plan Follow;  Intermittent fluid and electrolyte imbalance: total body overload  Sodium normalized with free water, BUN/creatinine remain stable, he still 6.4 L positive Plan Continue IV diuresis Replace potassium  Empirically replace magnesium Check magnesium in a.m. with chemistry   Diabetes type 2 With hyperglycemia, more recently hypoglycemia Plan Continue sliding scale insulin, glycemic control better in regards to hypoglycemic events since stopping Levemir  Mild thrombocytopenia Platelets holding  Plan Plan CBC, platelets actually improving  protein calorie malnutrition in setting of prolonged critical illness Plan Continue tube feeds via postpyloric feeding tube   Best practice:  Diet: Tube feeds Pain/Anxiety/Delirium protocol (if indicated): Versed, fentanyl VAP protocol (if indicated): 12/31 DVT prophylaxis: Low molecular weight heparin GI prophylaxis: PPI Glucose control: Sliding scale insulin Mobility: Bedrest Code Status: Full code Family Communication:  Glenard Haring (family friend->speaking to family in [place] for [place]) Disposition: Critical care ICU.  Little progress with little more to offer other than time we will continue current plan of care.  Will update family.    Critical care time:  38 minutes  CRITICAL CARE Performed by: Bonna Gains MD, PhD

## 2019-12-25 NOTE — Progress Notes (Signed)
Commercial tube holder placed.  ETT secured.

## 2019-12-26 LAB — CBC WITH DIFFERENTIAL/PLATELET
Abs Immature Granulocytes: 0.11 10*3/uL — ABNORMAL HIGH (ref 0.00–0.07)
Basophils Absolute: 0 10*3/uL (ref 0.0–0.1)
Basophils Relative: 1 %
Eosinophils Absolute: 0.4 10*3/uL (ref 0.0–0.5)
Eosinophils Relative: 7 %
HCT: 43.5 % (ref 39.0–52.0)
Hemoglobin: 13.1 g/dL (ref 13.0–17.0)
Immature Granulocytes: 2 %
Lymphocytes Relative: 18 %
Lymphs Abs: 0.9 10*3/uL (ref 0.7–4.0)
MCH: 29.8 pg (ref 26.0–34.0)
MCHC: 30.1 g/dL (ref 30.0–36.0)
MCV: 98.9 fL (ref 80.0–100.0)
Monocytes Absolute: 0.4 10*3/uL (ref 0.1–1.0)
Monocytes Relative: 7 %
Neutro Abs: 3.4 10*3/uL (ref 1.7–7.7)
Neutrophils Relative %: 65 %
Platelets: 115 10*3/uL — ABNORMAL LOW (ref 150–400)
RBC: 4.4 MIL/uL (ref 4.22–5.81)
RDW: 16.9 % — ABNORMAL HIGH (ref 11.5–15.5)
WBC: 5.2 10*3/uL (ref 4.0–10.5)
nRBC: 0.4 % — ABNORMAL HIGH (ref 0.0–0.2)

## 2019-12-26 LAB — MAGNESIUM: Magnesium: 2.1 mg/dL (ref 1.7–2.4)

## 2019-12-26 LAB — COMPREHENSIVE METABOLIC PANEL
ALT: 69 U/L — ABNORMAL HIGH (ref 0–44)
AST: 41 U/L (ref 15–41)
Albumin: 2.4 g/dL — ABNORMAL LOW (ref 3.5–5.0)
Alkaline Phosphatase: 108 U/L (ref 38–126)
Anion gap: 5 (ref 5–15)
BUN: 17 mg/dL (ref 8–23)
CO2: 38 mmol/L — ABNORMAL HIGH (ref 22–32)
Calcium: 8 mg/dL — ABNORMAL LOW (ref 8.9–10.3)
Chloride: 97 mmol/L — ABNORMAL LOW (ref 98–111)
Creatinine, Ser: 0.38 mg/dL — ABNORMAL LOW (ref 0.61–1.24)
GFR calc Af Amer: >60 mL/min (ref >60)
GFR calc non Af Amer: >60 mL/min (ref >60)
Glucose, Bld: 144 mg/dL — ABNORMAL HIGH (ref 70–99)
Potassium: 4.4 mmol/L (ref 3.5–5.1)
Sodium: 140 mmol/L (ref 135–145)
Total Bilirubin: 0.7 mg/dL (ref 0.3–1.2)
Total Protein: 5.9 g/dL — ABNORMAL LOW (ref 6.5–8.1)

## 2019-12-26 LAB — HEPARIN LEVEL (UNFRACTIONATED): Heparin Unfractionated: 0.4 IU/mL (ref 0.30–0.70)

## 2019-12-26 LAB — GLUCOSE, CAPILLARY
Glucose-Capillary: 128 mg/dL — ABNORMAL HIGH (ref 70–99)
Glucose-Capillary: 158 mg/dL — ABNORMAL HIGH (ref 70–99)
Glucose-Capillary: 194 mg/dL — ABNORMAL HIGH (ref 70–99)
Glucose-Capillary: 215 mg/dL — ABNORMAL HIGH (ref 70–99)
Glucose-Capillary: 259 mg/dL — ABNORMAL HIGH (ref 70–99)
Glucose-Capillary: 293 mg/dL — ABNORMAL HIGH (ref 70–99)

## 2019-12-26 LAB — C-REACTIVE PROTEIN: CRP: 10.6 mg/dL — ABNORMAL HIGH (ref <1.0)

## 2019-12-26 LAB — PHOSPHORUS: Phosphorus: 3.2 mg/dL (ref 2.5–4.6)

## 2019-12-26 LAB — FERRITIN: Ferritin: 137 ng/mL (ref 24–336)

## 2019-12-26 NOTE — Progress Notes (Signed)
.   NAMEWerner Labella, MRN:  [id number], DOB:  08-01-1956, LOS: 24 ADMISSION DATE:  2019-12-18, CONSULTATION DATE:  12/31 REFERRING MD:  Sherral Hammers, CHIEF COMPLAINT:  Progressive respiratory failure    Brief History   64 year old non-English-speaking male admitted on 12/17 with Covid pneumonia, transferred to the intensive care on 12/31 with acute decompensation  From time of admission to 12/31 Completed 5 days of remdesivir Completed 10 days of systemic steroids Transfused with convalescent plasma Received Actemra Never able to titrate off high flow oxygen Empiric antibiotics initiated on the 27th for presumptive aspiration  Past Medical History  Type 2 diabetes poorly controlled Spanish-speaking only   Significant Hospital Events    12/17 admitted.  Started on remdesivir, and systemic steroids 12/20 transferred out of ICU to PCU 12/21 received 1 unit of convalescent plasma, Actemra x1; still needing high flow oxygen  12/27 Asymmetric airspace disease --> presumptive aspiration event. Unasyn started.  12/31: Progressive worsening of work of breathing, saturations in 70s with respiratory rate in the 40s still on high flow oxygen at 40 L transferred emergently to intensive care intubated initial plateau pressure 52 1/1: Required norepinephrine, had decreased PaO2 while proned. FiO2 0.90, PEEP 12 1/4: vt decreased to 6 cc/kg-->pplat 32.  1/5: Plateau pressures still 34.  Requiring frequent hourly neuromuscular blockade, neuromuscular drip infusion reinitiated 1/6: Hypoglycemic overnight requiring D10 infusion, P/F ratio remains less than 150 so proning protocol continued.  Placed in the supine position early so new core track could be placed, although one was clogged off.  Left foot cold to palpation however did have strong posterior pedal and tibial pulses.  Empiric IV heparin started given concern for potential reduced arterial flow.  There is borderline ST elevation in lateral leads  twelve-lead EKG, given maximized oxygen and PEEP requirements not a candidate for any intervention at this point.  1/7: Cardiac enzymes negative.  Foot now warm. 1/8:added hydralazine and increased beta blocker  Consults:  Critical care consulted 12/31 Speech-language pathology  Procedures:  Oral endotracheal tube 12/31>>> Right IJ triple-lumen catheter 12/31>>> Radial A-line 12/31>>  Significant Diagnostic Tests:  CTA chest 12/18 >>1. No pulmonary embolus. 2. Progressive bilateral lung opacities from radiograph yesterday. Multifocal ground-glass opacities with superimposed consolidations in the dependent lower lobes with air bronchograms. Pattern consistent with COVID-19 pneumonia, superimposed bacterial infection in the lower lobes is also considered. 3. Coronary artery calcifications. Echocardiogram 1/1 >> LVEF 41-93%, grade 1 diastolic dysfunction, moderately elevated PASP with globally normal RV function  Micro Data:  12/17 point of care coronavirus positive Blood 12/17 >> negative MRSA screen 12/18 >> negative Resp culture 12/31 >> Moderate Serratia Marcecens, susceptibilities pending   Antimicrobials:  unasyn 12/27>>12/31 Cefepime 12/31>>> vanc 12/31>>> 1/2 Remdesivir from 12/27 through 12/31 (completed 5 days)  Interim history/subjective:  No acute events o/n.  Objective   Blood pressure 128/81, pulse 94, temperature 98.6 F (37 C), resp. rate (!) 35, height 5\' 5"  (1.651 m), weight 79.5 kg, SpO2 93 %. CVP:  [5 mmHg-17 mmHg] 5 mmHg  Vent Mode: PRVC FiO2 (%):  [80 %] 80 % Set Rate:  [35 bmp] 35 bmp Vt Set:  [400 mL-440 mL] 400 mL PEEP:  [14 cmH20] 14 cmH20 Plateau Pressure:  [41 cmH20-44 cmH20] 44 cmH20   Intake/Output Summary (Last 24 hours) at 12/26/2019 1131 Last data filed at 12/26/2019 0700 Gross per 24 hour  Intake 1834.86 ml  Output 730 ml  Net 1104.86 ml   Filed Weights   12/24/19 0500  12/25/19 0454 12/26/19 0453  Weight: 71.3 kg 70.6 kg 79.5 kg     Examination: General 64 year old male patient currently sedated HEENT orally intubated Pulmonary Crackles throughout bilaterally Ventilator assessment: PEEP:14 FiO2:60 Plateau pressure:50 Driving pressure: 34 Cardiac regular rate and rhythm Abdomen soft Extremities warm dry brisk cap refill pulses both palpable lower extremity edema Neuro heavily sedated GU clear yellow   Resolved Hospital Problem list   Multifactorial shock off pressors as of 1/5 Serratia HCAP completed antibiotics on 1/6 Assessment & Plan:  Acute hypoxic respiratory failure secondary to Covid pneumonia with ARDS -Ongoing concern for dysphagia documented  Plan: Continue low tidal volume ventilation at 37ml/per kilogram predicted body weight Excepting permissive hypercarbia given poor ventilator compliance Plateau pressure goal less than 30, driving pressure goal less than 15, we have been unable to achieve this goal since intubation Target PaO2 55 to 65% Continue aggressive diuresis as long as BUN/creatinine allow Continue heavy sedation with RASS goal negative  Continue via tube oxycodone and clonazepam VAP bundle Repeat chest x-ray today Continuing proning protocol as long as P/F ratio less than 150 in the supine position  Possible ST elevation MI, with mild elevation of lateral leads HTN -Hemodynamically currently stable, cardiac enzymes negative Plan D/c heparin Continue aspirin via tube  Continue beta-blockade -->increase to 50 bid Hydralazine PRN   Cold left foot Did have decent Doppler signal, now warm with palpable pulse Plan Follow;  Intermittent fluid and electrolyte imbalance: total body overload  Sodium normalized with free water, BUN/creatinine remain stable, he still volume positive Plan Continue IV diuresis Replace potassium Empirically replace magnesium Check magnesium in a.m. with chemistry   Diabetes type 2 With hyperglycemia, more recently hypoglycemia Plan Continue  sliding scale insulin, glycemic control better in regards to hypoglycemic events since stopping Levemir  Mild thrombocytopenia Platelets holding  Plan Plan CBC, platelets actually improving  protein calorie malnutrition in setting of prolonged critical illness Plan Continue tube feeds via postpyloric feeding tube   Best practice:  Diet: Tube feeds Pain/Anxiety/Delirium protocol (if indicated): Versed, fentanyl VAP protocol (if indicated): 12/31 DVT prophylaxis: Low molecular weight heparin GI prophylaxis: PPI Glucose control: Sliding scale insulin Mobility: Bedrest Code Status: Full code Family Communication:  Lawanna Kobus (family friend->speaking to family in [place] for [place]) Disposition: Critical care ICU.  Little progress with little more to offer other than time we will continue current plan of care.     Critical care time:  32 minutes  CRITICAL CARE Performed by: Gwynne Edinger MD, PhD 12/26/19 11:33 AM

## 2019-12-26 NOTE — Progress Notes (Addendum)
ANTICOAGULATION CONSULT NOTE - Follow Up Consult  Pharmacy Consult for Heparin Indication: Arterial thrombus  Not on File  Patient Measurements: Height: 5\' 5"  (165.1 cm) Weight: 175 lb 4.3 oz (79.5 kg) IBW/kg (Calculated) : 61.5  Vital Signs: Temp: 98.6 F (37 C) (01/10 0400) Temp Source: Esophageal (01/09 2345) BP: 128/81 (01/10 0700) Pulse Rate: 94 (01/10 0700)  Labs: Recent Labs    12/23/19 1112 12/23/19 1537 12/24/19 0440 12/24/19 0440 12/24/19 1736 12/25/19 0430 12/25/19 1825 12/26/19 0430  HGB  --   --  12.4*   < > 13.6 13.3  --  13.1  HCT  --   --  41.0  --  40.0 43.9  --  43.5  PLT  --   --  86*  --   --  103*  --  115*  HEPARINUNFRC 0.39  --  0.38  --   --  0.23* 0.33 0.40  CREATININE  --   --  0.42*  --   --   --   --  0.38*  TROPONINIHS 6 6  --   --   --   --   --   --    < > = values in this interval not displayed.    Estimated Creatinine Clearance: 93 mL/min (A) (by C-G formula based on SCr of 0.38 mg/dL (L)).  Assessment: 64 year old male on IV heparin for arterial thrombus.   Heparin level AM remains therapeutic at 0.4. H/H wnl. Plt improving.   Goal of Therapy:  Heparin level 0.3-0.7 units/ml Monitor platelets by anticoagulation protocol: Yes   Plan:  Continue heparin drip 1150 units / hr Daily heparin level, CBC  64, PharmD., BCPS Clinical Pharmacist Clinical phone for 12/26/19 until 5pm: [phone]  Addendum:  Patient is now having bleeding from his mouth and nose. IV heparin held. MD notified. Will need to follow up restarting heparin.  [birth year]-18-1972, PharmD., BCPS Clinical Pharmacist

## 2019-12-26 NOTE — Progress Notes (Signed)
JVD noted, Dr Ollen Bowl at bedsid to assess, no new orders given, will monitor.  Friend of family, [name] called, update given. Stated she will call family and update them. Will call tomorrow with decision on pallative care or not

## 2019-12-26 NOTE — Progress Notes (Signed)
Rhino rocket placed by Dr Ollen Bowl, pt tolerated well

## 2019-12-26 NOTE — Progress Notes (Signed)
Small, but continuous Bleeding noted coming from mouth and nose, charge nurse notified, MD notified. Heparin drip stopped.

## 2019-12-27 DIAGNOSIS — J156 Pneumonia due to other aerobic Gram-negative bacteria: Secondary | ICD-10-CM

## 2019-12-27 LAB — CBC WITH DIFFERENTIAL/PLATELET
Abs Immature Granulocytes: 0.11 10*3/uL — ABNORMAL HIGH (ref 0.00–0.07)
Basophils Absolute: 0 10*3/uL (ref 0.0–0.1)
Basophils Relative: 1 %
Eosinophils Absolute: 0.2 10*3/uL (ref 0.0–0.5)
Eosinophils Relative: 5 %
HCT: 39.5 % (ref 39.0–52.0)
Hemoglobin: 11.8 g/dL — ABNORMAL LOW (ref 13.0–17.0)
Immature Granulocytes: 3 %
Lymphocytes Relative: 16 %
Lymphs Abs: 0.7 10*3/uL (ref 0.7–4.0)
MCH: 29.6 pg (ref 26.0–34.0)
MCHC: 29.9 g/dL — ABNORMAL LOW (ref 30.0–36.0)
MCV: 99 fL (ref 80.0–100.0)
Monocytes Absolute: 0.4 10*3/uL (ref 0.1–1.0)
Monocytes Relative: 8 %
Neutro Abs: 3 10*3/uL (ref 1.7–7.7)
Neutrophils Relative %: 67 %
Platelets: 105 10*3/uL — ABNORMAL LOW (ref 150–400)
RBC: 3.99 MIL/uL — ABNORMAL LOW (ref 4.22–5.81)
RDW: 17 % — ABNORMAL HIGH (ref 11.5–15.5)
WBC: 4.4 10*3/uL (ref 4.0–10.5)
nRBC: 0.5 % — ABNORMAL HIGH (ref 0.0–0.2)

## 2019-12-27 LAB — PHOSPHORUS: Phosphorus: 2.9 mg/dL (ref 2.5–4.6)

## 2019-12-27 LAB — MAGNESIUM: Magnesium: 2.1 mg/dL (ref 1.7–2.4)

## 2019-12-27 LAB — GLUCOSE, CAPILLARY
Glucose-Capillary: 154 mg/dL — ABNORMAL HIGH (ref 70–99)
Glucose-Capillary: 173 mg/dL — ABNORMAL HIGH (ref 70–99)
Glucose-Capillary: 206 mg/dL — ABNORMAL HIGH (ref 70–99)
Glucose-Capillary: 165 mg/dL — ABNORMAL HIGH (ref 70–99)
Glucose-Capillary: 218 mg/dL — ABNORMAL HIGH (ref 70–99)

## 2019-12-27 LAB — FERRITIN: Ferritin: 209 ng/mL (ref 24–336)

## 2019-12-27 LAB — C-REACTIVE PROTEIN: CRP: 20.3 mg/dL — ABNORMAL HIGH (ref <1.0)

## 2019-12-27 MED ORDER — FUROSEMIDE 10 MG/ML IJ SOLN
40.0000 mg | Freq: Two times a day (BID) | INTRAMUSCULAR | Status: AC
  Administered 2019-12-27 – 2019-12-28 (×2): 40 mg via INTRAVENOUS
  Filled 2019-12-27 (×2): qty 4

## 2019-12-27 NOTE — Progress Notes (Signed)
ANTICOAGULATION CONSULT NOTE - Follow Up Consult  Pharmacy Consult for Heparin Indication: Arterial thrombus  Not on File  Patient Measurements: Height: 5\' 5"  (165.1 cm) Weight: 181 lb 10.5 oz (82.4 kg) IBW/kg (Calculated) : 61.5  Vital Signs: Temp: 98.4 F (36.9 C) (01/11 0900) Temp Source: Oral (01/11 0800) BP: 98/69 (01/11 0900) Pulse Rate: 76 (01/11 0900)  Labs: Recent Labs    12/25/19 0430 12/25/19 1825 12/26/19 0430 12/27/19 0430  HGB 13.3  --  13.1 11.8*  HCT 43.9  --  43.5 39.5  PLT 103*  --  115* 105*  HEPARINUNFRC 0.23* 0.33 0.40  --   CREATININE  --   --  0.38*  --     Estimated Creatinine Clearance: 94.7 mL/min (A) (by C-G formula based on SCr of 0.38 mg/dL (L)).  Assessment: 64 year old male started on IV heparin for arterial thrombus.   CBC: Hgb downt o 11.8, Plt remain low/stabe at 105k Patient reported to have bleeding from his mouth and nose on 1/10 with IV heparin held 1/10 at 15:00. MD notified. No further bleeding reported at this time.  Goal of Therapy:  Heparin level 0.3-0.7 units/ml Monitor platelets by anticoagulation protocol: Yes   Plan:  Continue to hold anticoagulation per Dr 3/10. Daily heparin level, CBC Follow up anticoagulation plans.   Vassie Loll PharmD, BCPS Clinical pharmacist phone 7am- 5pm: [phone] 12/27/2019 10:50 AM

## 2019-12-27 NOTE — Progress Notes (Signed)
.   NAMEJasiel [name], MRN:  [id number], DOB:  1956-10-07, LOS: 25 ADMISSION DATE:  11/23/2019, CONSULTATION DATE:  12/31 REFERRING MD:  Henry [name], CHIEF COMPLAINT:  Progressive respiratory failure    Brief History   64 year old non-English-speaking male admitted on 12/17 with COVID PNA, transferred to the intensive care on 12/31 with acute decompensation.    From time of admission to 12/31 Completed 5 days of remdesivir Completed 10 days of systemic steroids Transfused with convalescent plasma Received Actemra Never able to titrate off high flow oxygen Empiric antibiotics initiated on the 27th for presumptive aspiration  Past Medical History  Type 2 diabetes poorly controlled Spanish-speaking only   Significant Hospital Events   12/17 Admitted.  Started on remdesivir, and systemic steroids 12/20 Transferred out of ICU to PCU 12/21 Received 1 unit of convalescent plasma, Actemra x1; still needing high flow oxygen  12/27 Asymmetric airspace disease --> presumptive aspiration event. Unasyn started.  12/31 Progressive WOB, saturations in 70s with respiratory rate in the 40s still on high flow oxygen at 40 L transferred emergently to intensive care intubated initial plateau pressure 52 1/01 Required norepinephrine, had decreased PaO2 while proned. FiO2 0.90, PEEP 12 1/04 Vt decreased to 6 cc/kg-->pplat 32 1/05 Plateau pressures 34.  Requiring frequent hourly NMB, neuromuscular drip infusion reinitiated 1/06 Hypoglycemia requiring D10, P/F ratio <150, proning protocol continued.  Left foot cold to palpation however did have strong posterior pedal and tibial pulses.  Empiric IV heparin started given concern for potential reduced arterial flow.  There is borderline ST elevation in lateral leads twelve-lead EKG, given maximized oxygen and PEEP requirements not a candidate for any intervention at this point.  1/07 Cardiac enzymes negative.  Foot now warm. 1/08 Added hydralazine and increased beta  blocker   Consults:  PCCM 12/31  SLP   Procedures:  ETT 12/31 >> Right IJ TLC 12/31 >> Radial A-line 12/31 >>  Significant Diagnostic Tests:  CTA chest 12/18 >> Neg for PE, progressive bilateral lung opacities. Multifocal ground-glass opacities with superimposed consolidations in the dependent lower lobes with air bronchograms. Pattern consistent with COVID-19 pneumonia, superimposed bacterial infection in the lower lobes is also considered. Coronary artery calcifications. Echocardiogram 1/1 >> LVEF 60-65%, grade 1 diastolic dysfunction, moderately elevated PASP with globally normal RV function  Micro Data:  POC COVID 12/17 >> positive BCx2 12/17 >> negative  MRSA screen 12/18 >> negative Resp culture 12/31 >> Moderate Serratia Marcecens >> S- ceftriaxone, R-cefazolin  Antimicrobials:  Unasyn 12/27 >> 12/31 Cefepime 12/31 >> 1/3  Vanc 12/31 >> 1/2 Remdesivir from 12/27 through 12/31 (completed 5 days)  Interim history/subjective:  Febrile to 101.3 on 1/10, now 97.7 / WBC stable ~5 Glucose range - 123-260 I/O - 1.5L UOP, 1L positive in last 24h, remains + balance overall PEEP 14, FiO2 70%, Peak 43, Pplat 42 Nose bleeding with nasal packing in place. Remains on versed at 10, dilaudid at 4, nimbex 3 Family reportedly trying to get emergency visitation [place]  Objective   Blood pressure 114/73, pulse 85, temperature 97.7 F (36.5 C), resp. rate (!) 35, height 5\' 5"  (1.651 m), weight 82.4 kg, SpO2 91 %. CVP:  [12 mmHg-17 mmHg] 13 mmHg  Vent Mode: PRVC FiO2 (%):  [80 %] 80 % Set Rate:  [35 bmp] 35 bmp Vt Set:  [400 mL-440 mL] 440 mL PEEP:  [14 cmH20] 14 cmH20 Plateau Pressure:  [41 cmH20-42 cmH20] 42 cmH20   Intake/Output Summary (Last 24 hours)  at 12/27/2019 0819 Last data filed at 12/27/2019 0700 Gross per 24 hour  Intake 2801.76 ml  Output 1381 ml  Net 1420.76 ml   Filed Weights   12/25/19 0454 12/26/19 0453 12/27/19 0500  Weight: 70.6 kg 79.5 kg 82.4  kg    Examination: General: critically ill appearing adult male lying in bed on vent in NAD HEENT: MM pink/moist, ETT Neuro: sedate / paralyzed on nimbex  CV: s1s2 RRR, no m/r/g PULM:  Non-labored/synchronous on vent, lungs bilaterally coarse GI: soft, bsx4 active  Extremities: warm/dry, generalized 1+ edema  Skin: no rashes or lesions, healed sacral wound  Resolved Hospital Problem list   Multifactorial shock off pressors as of 1/5 Serratia HCAP completed antibiotics on 1/6  Assessment & Plan:   Acute Hypoxic Respiratory Failure secondary to COVID PNA with ARDS Ongoing concern for dysphagia documented.  High Peak/plateau since intubation -low Vt ventilation 4-8cc/kg -goal plateau pressure <30, driving pressure <30 cm H2O -target PaO2 55-65, titrate PEEP/FiO2 per ARDS protocol  -if P/F ratio <150, consider prone therapy for 16 hours per day -goal CVP <4, diuresis as necessary -VAP prevention measures  -follow intermittent CXR   Sedation Needs while on Vent  -continue PAD protocol with NMB -PT klonopin and oxycodone   Possible ST elevation MI, with mild elevation of lateral leads HTN Hemodynamically stable, cardiac enzymes negative -monitor off heparin gtt -ASA per tube  -beta blocker, lopressor 50 mg BID -PRN hydralazine   Concern for Cold L Foot Did have decent doppler signal, foot remains warm with palpable pulse -follow clinical exam   Intermittent fluid and electrolyte imbalance: total body overload  Sodium normalized with free water, BUN/creatinine remain stable, he still volume positive -Trend BMP / urinary output -continue free water PT -Replace electrolytes as indicated -Avoid nephrotoxic agents, ensure adequate renal perfusion -lasix 40 mg BID x2 doses  DM II with Hyperglycemia Episodes of hypoglycemia, glucoses improved off levemir  -SSI   Mild Thrombocytopenia -trend CBC   Moderate Protein Calorie Malnutrition in setting of Prolonged Critical  Illness -continue post-pyloric TF   Best practice:  Diet: Tube feeds Pain/Anxiety/Delirium protocol (if indicated): Versed, fentanyl VAP protocol (if indicated): 12/31 DVT prophylaxis: Low molecular weight heparin GI prophylaxis: PPI Glucose control: Sliding scale insulin Mobility: Bedrest Code Status: Full code Family Communication:  Glenard Haring (family friend->has been speaking to family in [place] for [place]).  Will work to arrange family meeting with translator this week to discuss goals of care with Son.   Disposition: ICU.    Critical care time:  58 minutes    Noe Gens, MSN, NP-C  Pulmonary & Critical Care 12/27/2019, 8:19 AM   Please see [email or web address] for pager details.

## 2019-12-27 NOTE — Progress Notes (Signed)
Art line positional, no blood draw back, not correlated with cuff. DC'ed at this time. noninvasice BP stable.

## 2019-12-28 ENCOUNTER — Inpatient Hospital Stay (HOSPITAL_COMMUNITY): Payer: HRSA Program

## 2019-12-28 LAB — GLUCOSE, CAPILLARY
Glucose-Capillary: 205 mg/dL — ABNORMAL HIGH (ref 70–99)
Glucose-Capillary: 145 mg/dL — ABNORMAL HIGH (ref 70–99)
Glucose-Capillary: 128 mg/dL — ABNORMAL HIGH (ref 70–99)
Glucose-Capillary: 237 mg/dL — ABNORMAL HIGH (ref 70–99)
Glucose-Capillary: 254 mg/dL — ABNORMAL HIGH (ref 70–99)
Glucose-Capillary: 176 mg/dL — ABNORMAL HIGH (ref 70–99)

## 2019-12-28 LAB — POCT I-STAT 7, (LYTES, BLD GAS, ICA,H+H)
Acid-Base Excess: 11 mmol/L — ABNORMAL HIGH (ref 0.0–2.0)
Bicarbonate: 39.5 mmol/L — ABNORMAL HIGH (ref 20.0–28.0)
Calcium, Ion: 1.13 mmol/L — ABNORMAL LOW (ref 1.15–1.40)
HCT: 38 % — ABNORMAL LOW (ref 39.0–52.0)
Hemoglobin: 12.9 g/dL — ABNORMAL LOW (ref 13.0–17.0)
O2 Saturation: 91 %
Patient temperature: 38
Potassium: 4.4 mmol/L (ref 3.5–5.1)
Sodium: 140 mmol/L (ref 135–145)
TCO2: 42 mmol/L — ABNORMAL HIGH (ref 22–32)
pCO2 arterial: 72.4 mmHg (ref 32.0–48.0)
pH, Arterial: 7.349 — ABNORMAL LOW (ref 7.350–7.450)
pO2, Arterial: 70 mmHg — ABNORMAL LOW (ref 83.0–108.0)

## 2019-12-28 LAB — COMPREHENSIVE METABOLIC PANEL
ALT: 61 U/L — ABNORMAL HIGH (ref 0–44)
AST: 44 U/L — ABNORMAL HIGH (ref 15–41)
Albumin: 2.1 g/dL — ABNORMAL LOW (ref 3.5–5.0)
Alkaline Phosphatase: 108 U/L (ref 38–126)
Anion gap: 9 (ref 5–15)
BUN: 17 mg/dL (ref 8–23)
CO2: 38 mmol/L — ABNORMAL HIGH (ref 22–32)
Calcium: 8.2 mg/dL — ABNORMAL LOW (ref 8.9–10.3)
Chloride: 94 mmol/L — ABNORMAL LOW (ref 98–111)
Creatinine, Ser: 0.37 mg/dL — ABNORMAL LOW (ref 0.61–1.24)
GFR calc Af Amer: >60 mL/min (ref >60)
GFR calc non Af Amer: >60 mL/min (ref >60)
Glucose, Bld: 138 mg/dL — ABNORMAL HIGH (ref 70–99)
Potassium: 4.3 mmol/L (ref 3.5–5.1)
Sodium: 141 mmol/L (ref 135–145)
Total Bilirubin: 0.7 mg/dL (ref 0.3–1.2)
Total Protein: 6 g/dL — ABNORMAL LOW (ref 6.5–8.1)

## 2019-12-28 LAB — CBC
HCT: 40.8 % (ref 39.0–52.0)
Hemoglobin: 12.1 g/dL — ABNORMAL LOW (ref 13.0–17.0)
MCH: 29.6 pg (ref 26.0–34.0)
MCHC: 29.7 g/dL — ABNORMAL LOW (ref 30.0–36.0)
MCV: 99.8 fL (ref 80.0–100.0)
Platelets: 116 10*3/uL — ABNORMAL LOW (ref 150–400)
RBC: 4.09 MIL/uL — ABNORMAL LOW (ref 4.22–5.81)
RDW: 17.1 % — ABNORMAL HIGH (ref 11.5–15.5)
WBC: 5.6 10*3/uL (ref 4.0–10.5)
nRBC: 0.4 % — ABNORMAL HIGH (ref 0.0–0.2)

## 2019-12-28 LAB — MAGNESIUM: Magnesium: 2 mg/dL (ref 1.7–2.4)

## 2019-12-28 LAB — PHOSPHORUS: Phosphorus: 3.1 mg/dL (ref 2.5–4.6)

## 2019-12-28 LAB — FERRITIN: Ferritin: 263 ng/mL (ref 24–336)

## 2019-12-28 NOTE — Progress Notes (Addendum)
.   NAMEEliezer [name], MRN:  [id number], DOB:  01/16/1956, LOS: 26 ADMISSION DATE:  12-11-2019, CONSULTATION DATE:  12/31 REFERRING MD:  Henry [name], CHIEF COMPLAINT:  Progressive respiratory failure    Brief History   64 year old non-English-speaking male admitted on 12/17 with COVID PNA, transferred to the intensive care on 12/31 with acute decompensation.    From time of admission to 12/31 Completed 5 days of remdesivir Completed 10 days of systemic steroids Transfused with convalescent plasma Received Actemra Never able to titrate off high flow oxygen Empiric antibiotics initiated on the 27th for presumptive aspiration  Past Medical History  Type 2 diabetes poorly controlled Spanish-speaking only   Significant Hospital Events   12/17 Admitted.  Started on remdesivir, and systemic steroids 12/20 Transferred out of ICU to PCU 12/21 Received 1 unit of convalescent plasma, Actemra x1; still needing high flow oxygen  12/27 Asymmetric airspace disease --> presumptive aspiration event. Unasyn started.  12/31 Progressive WOB, saturations in 70s with respiratory rate in the 40s still on high flow oxygen at 40 L transferred emergently to intensive care intubated initial plateau pressure 52 1/01 Required norepinephrine, had decreased PaO2 while proned. FiO2 0.90, PEEP 12 1/04 Vt decreased to 6 cc/kg-->pplat 32 1/05 Plateau pressures 34.  Requiring frequent hourly NMB, neuromuscular drip infusion reinitiated 1/06 Hypoglycemia requiring D10, P/F ratio <150, proning protocol continued.  Left foot cold to palpation however did have strong posterior pedal and tibial pulses.  Empiric IV heparin started given concern for potential reduced arterial flow.  There is borderline ST elevation in lateral leads twelve-lead EKG, given maximized oxygen and PEEP requirements not a candidate for any intervention at this point.  1/07 Cardiac enzymes negative.  Foot now warm. 1/08 Added hydralazine and increased beta  blocker   Consults:  PCCM 12/31  SLP   Procedures:  ETT 12/31 >> Right IJ TLC 12/31 >> Radial A-line 12/31 >>  Significant Diagnostic Tests:  CTA chest 12/18 >> Neg for PE, progressive bilateral lung opacities. Multifocal ground-glass opacities with superimposed consolidations in the dependent lower lobes with air bronchograms. Pattern consistent with COVID-19 pneumonia, superimposed bacterial infection in the lower lobes is also considered. Coronary artery calcifications. Echocardiogram 1/1 >> LVEF 60-65%, grade 1 diastolic dysfunction, moderately elevated PASP with globally normal RV function  Micro Data:  POC COVID 12/17 >> positive BCx2 12/17 >> negative  MRSA screen 12/18 >> negative Resp culture 12/31 >> Moderate Serratia Henry [name] >> S- ceftriaxone, R-cefazolin  Antimicrobials:  Unasyn 12/27 >> 12/31 Cefepime 12/31 >> 1/3  Vanc 12/31 >> 1/2 Remdesivir from 12/27 through 12/31 (completed 5 days)  Interim history/subjective:  Tmax 100.6  Glucose range - 138-254 I/O - 2.6L UOP, net negative in last 24h Less bleeding from nose / packing remains in place Remains on versed, dilaudid, nimbex No acute events overnight.   Objective   Blood pressure 104/72, pulse (!) 110, temperature 99.7 F (37.6 C), resp. rate (!) 35, height 5\' 5"  (1.651 m), weight 82.4 kg, SpO2 90 %. CVP:  [7 mmHg-13 mmHg] 7 mmHg  Vent Mode: PRVC FiO2 (%):  [70 %-100 %] 90 % Set Rate:  [35 bmp] 35 bmp Vt Set:  [440 mL] 440 mL PEEP:  [14 cmH20] 14 cmH20 Plateau Pressure:  [38 cmH20-40 cmH20] 40 cmH20   Intake/Output Summary (Last 24 hours) at 12/28/2019 1623 Last data filed at 12/28/2019 1600 Gross per 24 hour  Intake 2098.1 ml  Output 4310 ml  Net -2211.9 ml   Filed  Weights   12/25/19 0454 12/26/19 0453 12/27/19 0500  Weight: 70.6 kg 79.5 kg 82.4 kg    Examination: General: critically ill appearing adult male lying in bed on vent in NAD HEENT: MM pink/moist, ETT Neuro: sedate /  paralyzed on nimbex  CV: s1s2 RRR, no m/r/g PULM:  Non-labored/synchronous on vent, lungs bilaterally coarse GI: soft, bsx4 active  Extremities: warm/dry, generalized 1+ edema  Skin: no rashes or lesions, healed sacral wound  Resolved Hospital Problem list   Multifactorial shock off pressors as of 1/5 Serratia HCAP completed antibiotics on 1/6  Assessment & Plan:   Acute Hypoxic Respiratory Failure secondary to COVID PNA with ARDS Ongoing concern for dysphagia documented.  High Peak/plateau since intubation -low Vt ventilation 4-8cc/kg -goal plateau pressure <30, driving pressure <19 cm H2O -target PaO2 55-65, titrate PEEP/FiO2 per ARDS protocol  -if P/F ratio <150, consider prone therapy for 16 hours per day -goal CVP <4, diuresis as necessary -VAP prevention measures  -follow intermittent CXR  -abg now   Sedation Needs while on Vent  -continue PAD protocol, NMB  -continue klonopin, oxycodone  Possible ST elevation MI, with mild elevation of lateral leads HTN Hemodynamically stable, cardiac enzymes negative -monitor off heparin -continue ASA, beta blocker -PRN hydralazine  Concern for Cold L Foot Did have decent doppler signal, foot remains warm with palpable pulse -follow clinically   Intermittent fluid and electrolyte imbalance: total body overload  Sodium normalized with free water, BUN/creatinine remain stable, he still volume positive -continue free water  -Trend BMP / urinary output -Replace electrolytes as indicated -Avoid nephrotoxic agents, ensure adequate renal perfusion  DM II with Hyperglycemia Episodes of hypoglycemia, glucoses improved off levemir  -SSI  Mild Thrombocytopenia -Trend CBC  Moderate Protein Calorie Malnutrition in setting of Prolonged Critical Illness -continue TF  Best practice:  Diet: Tube feeds Pain/Anxiety/Delirium protocol (if indicated): Versed, fentanyl VAP protocol (if indicated): 12/31 DVT prophylaxis: Low molecular  weight heparin GI prophylaxis: PPI Glucose control: Sliding scale insulin Mobility: Bedrest Code Status: Full code Family Communication:  Son - Henry [name], Daughter - Henry [name] & daughter-in-law Henry [name] updated via phone 1/12 with intrepreter utilized.  Henry [name] Dy (family friend->has been speaking to family in [place] for [place]).   Informed family that he has been critically ill for some time but currently has single organ failure with possibility of ~ 50% survival with long road for recovery. They would be accepting of tracheostomy and verbally gave permission over the phone.  Reviewed that he is too ill at this point to perform the procedure and they indicate understanding. Reviewed concept of CPR with family and they indicate they would want CPR in the event of cardiac arrest if we as medical providers felt it would make a difference.  Disposition: ICU.     Daughter in [place] email:  [email or web address] .Gregg (daughter) needs letter for travel.  Initial letter went to wife who was declared too old to travel.   Critical care time:  22 minutes    Noe Gens, MSN, NP-C [facility] 12/28/2019, 4:23 PM   Please see [email or web address] for pager details.

## 2019-12-28 NOTE — Progress Notes (Signed)
Spoke to family friend [name] and gave pt updates.Also was able to get number for daughter in law in [place], [place]. # [phone]. Notified NP

## 2019-12-28 NOTE — Progress Notes (Signed)
Became asynchronous with vent, desat to 84%. Nimbex restarted, fiO2 turned to 100%. Sat revocered to 94% RT notified. Pt stable in no distress

## 2019-12-29 ENCOUNTER — Inpatient Hospital Stay: Payer: Self-pay

## 2019-12-29 LAB — GLUCOSE, CAPILLARY
Glucose-Capillary: 155 mg/dL — ABNORMAL HIGH (ref 70–99)
Glucose-Capillary: 164 mg/dL — ABNORMAL HIGH (ref 70–99)
Glucose-Capillary: 188 mg/dL — ABNORMAL HIGH (ref 70–99)
Glucose-Capillary: 188 mg/dL — ABNORMAL HIGH (ref 70–99)
Glucose-Capillary: 245 mg/dL — ABNORMAL HIGH (ref 70–99)
Glucose-Capillary: 260 mg/dL — ABNORMAL HIGH (ref 70–99)

## 2019-12-29 LAB — CBC
HCT: 36.3 % — ABNORMAL LOW (ref 39.0–52.0)
Hemoglobin: 10.8 g/dL — ABNORMAL LOW (ref 13.0–17.0)
MCH: 29.4 pg (ref 26.0–34.0)
MCHC: 29.8 g/dL — ABNORMAL LOW (ref 30.0–36.0)
MCV: 98.9 fL (ref 80.0–100.0)
Platelets: 135 10*3/uL — ABNORMAL LOW (ref 150–400)
RBC: 3.67 MIL/uL — ABNORMAL LOW (ref 4.22–5.81)
RDW: 16.8 % — ABNORMAL HIGH (ref 11.5–15.5)
WBC: 5.7 10*3/uL (ref 4.0–10.5)
nRBC: 0 % (ref 0.0–0.2)

## 2019-12-29 LAB — BASIC METABOLIC PANEL
Anion gap: 7 (ref 5–15)
BUN: 19 mg/dL (ref 8–23)
CO2: 38 mmol/L — ABNORMAL HIGH (ref 22–32)
Calcium: 7.8 mg/dL — ABNORMAL LOW (ref 8.9–10.3)
Chloride: 94 mmol/L — ABNORMAL LOW (ref 98–111)
Creatinine, Ser: 0.41 mg/dL — ABNORMAL LOW (ref 0.61–1.24)
GFR calc Af Amer: >60 mL/min (ref >60)
GFR calc non Af Amer: >60 mL/min (ref >60)
Glucose, Bld: 163 mg/dL — ABNORMAL HIGH (ref 70–99)
Potassium: 4 mmol/L (ref 3.5–5.1)
Sodium: 139 mmol/L (ref 135–145)

## 2019-12-29 LAB — POCT I-STAT 7, (LYTES, BLD GAS, ICA,H+H)
Acid-Base Excess: 12 mmol/L — ABNORMAL HIGH (ref 0.0–2.0)
Bicarbonate: 39.8 mmol/L — ABNORMAL HIGH (ref 20.0–28.0)
Calcium, Ion: 1.1 mmol/L — ABNORMAL LOW (ref 1.15–1.40)
HCT: 38 % — ABNORMAL LOW (ref 39.0–52.0)
Hemoglobin: 12.9 g/dL — ABNORMAL LOW (ref 13.0–17.0)
O2 Saturation: 95 %
Patient temperature: 37.5
Potassium: 4.6 mmol/L (ref 3.5–5.1)
Sodium: 137 mmol/L (ref 135–145)
TCO2: 42 mmol/L — ABNORMAL HIGH (ref 22–32)
pCO2 arterial: 65.3 mmHg (ref 32.0–48.0)
pH, Arterial: 7.395 (ref 7.350–7.450)
pO2, Arterial: 81 mmHg — ABNORMAL LOW (ref 83.0–108.0)

## 2019-12-29 LAB — PHOSPHORUS: Phosphorus: 2.8 mg/dL (ref 2.5–4.6)

## 2019-12-29 LAB — FERRITIN: Ferritin: 318 ng/mL (ref 24–336)

## 2019-12-29 LAB — MAGNESIUM: Magnesium: 1.9 mg/dL (ref 1.7–2.4)

## 2019-12-29 MED ORDER — POTASSIUM CHLORIDE 20 MEQ/15ML (10%) PO SOLN
40.0000 meq | Freq: Once | ORAL | Status: AC
  Administered 2019-12-29: 16:00:00 40 meq
  Filled 2019-12-29: qty 30

## 2019-12-29 MED ORDER — FUROSEMIDE 10 MG/ML IJ SOLN
40.0000 mg | Freq: Two times a day (BID) | INTRAMUSCULAR | Status: AC
  Administered 2019-12-29 – 2019-12-30 (×2): 40 mg via INTRAVENOUS
  Filled 2019-12-29 (×2): qty 4

## 2019-12-29 MED ORDER — PRO-STAT SUGAR FREE PO LIQD
60.0000 mL | Freq: Two times a day (BID) | ORAL | Status: DC
  Administered 2019-12-29 – 2020-01-03 (×11): 60 mL via ORAL
  Filled 2019-12-29 (×11): qty 60

## 2019-12-29 MED ORDER — VITAL 1.5 CAL PO LIQD
1000.0000 mL | ORAL | Status: DC
  Administered 2019-12-29 – 2020-01-01 (×4): 1000 mL

## 2019-12-29 NOTE — Progress Notes (Signed)
Pt becoming increasing tachycardic up to 130s. O2 demand increasing as sat dropped to 80% on 70% FIO2. Pt seems to have become tolerant to paralytic medication so this RN titrated per MD order. Pt now back synchronized with ventilator breaths. Pt heart rate remains elevated at 130. FIO2 is still at 100 % and O2 sat is at 89%. In-line suction catheter passed through tube with no problem and minimal secretions. Bladder scanned at this time to check for retention after Foley removed and lasix given. Found to have 200cc and pt voiding spontaneously. Increased analgesic medication per protocol. RT called at this time. Dr. Vassie Loll paged at this time.

## 2019-12-29 NOTE — Progress Notes (Signed)
Nutrition Follow-up  DOCUMENTATION CODES:   Not applicable  INTERVENTION:   Increase tube Feeding: -Vital 1.5 at 55 ml/hr via Cortrak  -60 ml Prostat QID -Free water flushes 250 ml Q4 hours- per CCM  Provides 2380 kcals, 149 g of protein and 1008 mL of free water (2508 ml with flushes)   Continue MVI with Minerals  NUTRITION DIAGNOSIS:   Inadequate oral intake related to inability to eat as evidenced by NPO status.  Ongoing  GOAL:   Patient will meet greater than or equal to 90% of their needs  Addressed via TF  MONITOR:   Diet advancement, Labs, Weight trends  REASON FOR ASSESSMENT:   Consult, Ventilator Enteral/tube feeding initiation and management  ASSESSMENT:   64 year old male who presented to the ED on 12/17 with SOB and abdominal pain. No known PMH. Pt tested positive for COVID-19.  12/17 Admit 12/23 Cortrak placed for poor po, Nocturnal TF 12/27 ?aspiration event 12/31 Intubated 01/05 TF on hold due to misplaced Cortrak 01/06 Cortrak re-placed, post-pyloric  Per RN pt hasn't been proned in three days. Heading towards trach placement per CCM. R nasal packing removed.  Left ischemic foot improved. Remains on nimbex. Rectal tube in place, stools soft but not formed. Hypoglycemia resolved. Tolerating tube feeding at goal rate.   Admission weight: 78.6 kg  Current weight: 83.3 kg (up 4 kg in last 3 days)  Patient remains intubated on ventilator support MV: 15.3 L/min Temp (24hrs), Avg:99.4 F (37.4 C), Min:98.1 F (36.7 C), Max:100.4 F (38 C)    I/O: +6,998 ml since 12/30  UOP: 2,270 ml x 24 hrs Stool: 1,150 ml x 24 hrs   Drips: nimbex, versed Medications: colace (given this am), SS novolog, MVI with minerals, miralax (given this am), senokot  Labs: calcium ionized 1.13 (L) CBG 155-254  Diet Order:   Diet Order    None      EDUCATION NEEDS:   No education needs have been identified at this time  Skin:  Skin Assessment: Skin  Integrity Issues: Skin Integrity Issues:: Stage II Stage II: buttocks  Last BM:  1/13  Height:   Ht Readings from Last 1 Encounters:  12/04/19 5\' 5"  (1.651 m)    Weight:   Wt Readings from Last 1 Encounters:  12/29/19 83.3 kg    Ideal Body Weight:  57 kg  BMI:  Body mass index is 30.56 kg/m.  Estimated Nutritional Needs:   Kcal:  2200-2400  Protein:  115-140 g  Fluid:  >/= 2.0 L  12/31/19 RD, LDN Clinical Nutrition Pager # [phone]

## 2019-12-29 NOTE — Progress Notes (Signed)
Spoke with [name], friend via phone. Appreciated update and will pass information on to pt family.

## 2019-12-29 NOTE — Progress Notes (Signed)
Securechat with Toni Amend, RN regarding PICC.  Aware that PICC will be placed tomorrow.  Gasper Lloyd, RN

## 2019-12-29 NOTE — Progress Notes (Signed)
Nasal packing removed from right nare at this time per Vassie Loll, MD. No bleeding after removal.

## 2019-12-29 NOTE — Progress Notes (Signed)
.   NAMEJeanmarc [name], MRN:  [id number], DOB:  04-Jun-1956, LOS: 27 ADMISSION DATE:  20-Dec-2019, CONSULTATION DATE:  12/31 REFERRING MD:  Henry [name], CHIEF COMPLAINT:  Progressive respiratory failure    Brief History   64 year old non-English-speaking male admitted on 12/17 with COVID PNA, transferred to the intensive care on 12/31 with acute decompensation.    From time of admission to 1/13 Completed 5 days of remdesivir Completed 10 days of systemic steroids Transfused with convalescent plasma Received Actemra Never able to titrate off high flow oxygen Empiric antibiotics initiated on the 27th for presumptive aspiration Treated for serratia HCAP Vent settings have remained high, no real change with prone therapy   Past Medical History  Type 2 diabetes poorly controlled Spanish-speaking only   Significant Hospital Events   12/17 Admitted.  Started on remdesivir, and systemic steroids 12/20 Transferred out of ICU to PCU 12/21 Received 1 unit of convalescent plasma, Actemra x1; still needing high flow oxygen  12/27 Asymmetric airspace disease --> presumptive aspiration event. Unasyn started.  12/31 Progressive WOB, saturations in 70s with respiratory rate in the 40s still on high flow oxygen at 40 L transferred emergently to intensive care intubated initial plateau pressure 52 1/01 Required norepinephrine, had decreased PaO2 while proned. FiO2 0.90, PEEP 12 1/04 Vt decreased to 6 cc/kg-->pplat 32 1/05 Plateau pressures 34.  Requiring frequent hourly NMB, neuromuscular drip infusion reinitiated 1/06 Hypoglycemia requiring D10, P/F ratio <150, proning protocol continued.  Left foot cold to palpation however did have strong posterior pedal and tibial pulses.  Empiric IV heparin started given concern for potential reduced arterial flow.  There is borderline ST elevation in lateral leads twelve-lead EKG, given maximized oxygen and PEEP requirements not a candidate for any intervention at this  point.  1/07 Cardiac enzymes negative.  Foot now warm. 1/08 Added hydralazine and increased beta blocker  1/12 Long family discussion regarding plan of care with son, two daughters.  No clinical change.   Consults:  PCCM 12/31  SLP   Procedures:  ETT 12/31 >> Right IJ TLC 12/31 >> Radial A-line 12/31 >> removed  Significant Diagnostic Tests:  CTA chest 12/18 >> Neg for PE, progressive bilateral lung opacities. Multifocal ground-glass opacities with superimposed consolidations in the dependent lower lobes with air bronchograms. Pattern consistent with COVID-19 pneumonia, superimposed bacterial infection in the lower lobes is also considered. Coronary artery calcifications. Echocardiogram 1/1 >> LVEF 60-65%, grade 1 diastolic dysfunction, moderately elevated PASP with globally normal RV function  Micro Data:  POC COVID 12/17 >> positive BCx2 12/17 >> negative  MRSA screen 12/18 >> negative Resp culture 12/31 >> Moderate Serratia Henry [name] >> S- ceftriaxone, R-cefazolin  Antimicrobials:  Unasyn 12/27 >> 12/31 Cefepime 12/31 >> 1/3  Vanc 12/31 >> 1/2 Remdesivir from 12/27 through 12/31 (completed 5 days)  Interim history/subjective:  Tmax 99.9 Glucose range - 138-237 I/O - 2.2L UOP, net neg 2.2 L in 24 hours PEEP 14, FiO2 90%, Peak 30, Pplat 26 Did not tolerate stopping of NMB infusion > sats dropped into the 70's   Objective   Blood pressure 107/69, pulse 89, temperature 99.9 F (37.7 C), resp. rate (!) 35, height 5\' 5"  (1.651 m), weight 83.3 kg, SpO2 (!) 88 %. CVP:  [7 mmHg-14 mmHg] 14 mmHg  Vent Mode: PRVC FiO2 (%):  [90 %] 90 % Set Rate:  [35 bmp] 35 bmp Vt Set:  [440 mL] 440 mL PEEP:  [14 cmH20] 14 cmH20 Plateau Pressure:  [40 cmH20-42 cmH20] 40  cmH20   Intake/Output Summary (Last 24 hours) at 12/29/2019 0824 Last data filed at 12/29/2019 0800 Gross per 24 hour  Intake 1303.7 ml  Output 3280 ml  Net -1976.3 ml   Filed Weights   12/26/19 0453 12/27/19 0500  12/29/19 0500  Weight: 79.5 kg 82.4 kg 83.3 kg    Examination: General: Critically ill-appearing adult male lying in bed on vent in no acute distress HEENT: MM pink/moist, ETT, nasal packing Neuro: Sedate, in MV infusion CV: s1s2 RRR, no m/r/g PULM: Synchronous, diminished breath sounds bilaterally GI: soft, bsx4 active  Extremities: warm/dry, 1+ generalized edema  Skin: no rashes or lesions  Resolved Hospital Problem list   Multifactorial shock off pressors as of 1/5 Serratia HCAP completed antibiotics on 1/6  Assessment & Plan:   Acute Hypoxic Respiratory Failure secondary to COVID PNA with ARDS Ongoing concern for dysphagia documented.  High Peak/plateau since intubation -low Vt ventilation 4-8cc/kg -goal plateau pressure <30, driving pressure <81 cm W2X -target PaO2 55-65, titrate PEEP/FiO2 per ARDS protocol  -VAP prevention measures  -follow intermittent CXR  -Lasix 40 mg twice daily for 2 doses with KCL -Will need tracheostomy once weaned to a point where he can tolerate procedure  Sedation Needs while on Vent  -continue PAD protocol -would like to trial NMB holiday, PRN.   -continue PT oxycodone, klonopin   Possible ST elevation MI, with mild elevation of lateral leads HTN Hemodynamically stable, cardiac enzymes negative -continue ASA, beta blocker  -PRN hydralazine   Concern for Cold L Foot Did have decent doppler signal, foot remains warm with palpable pulse -monitor clinical exam   Epistaxis  -Remove nasal packing  Intermittent fluid and electrolyte imbalance: total body overload  Sodium normalized with free water, BUN/creatinine remain stable, he still volume positive -continue free water  -Trend BMP / urinary output -Replace electrolytes as indicated -Avoid nephrotoxic agents, ensure adequate renal perfusion  DM II with Hyperglycemia Episodes of hypoglycemia, glucoses improved off levemir  -SSI, very sensitive scale   Mild  Thrombocytopenia -Trend CBC  Moderate Protein Calorie Malnutrition in setting of Prolonged Critical Illness -continue TF per Nutrition   IV access -Place PICC line, discontinue central line  Best practice:  Diet: TF Pain/Anxiety/Delirium protocol (if indicated): Versed, dilaudid VAP protocol (if indicated): in place DVT prophylaxis: Low molecular weight heparin GI prophylaxis: PPI Glucose control: Sliding scale insulin Mobility: Bedrest Code Status: Full code Family Communication:  Family update 1/12 >Son - Yolanda Bonine, Daughter - [name] & daughter-in-law [name] updated via phone 1/12 with intrepreter utilized.  [name] (family friend->has been speaking to family in [place] for [place] and they give permission for this).  Informed family that he has been critically ill for some time but currently has single organ failure with possibility of ~ 50% survival with long road for recovery. They would be accepting of tracheostomy and verbally gave permission over the phone.  Reviewed that he is too ill at this point to perform the procedure and they indicate understanding. Reviewed concept of CPR with family and they indicate they would want CPR in the event of cardiac arrest if we as medical providers felt it would make a difference.  Disposition: ICU.     Daughter in [place] email:  [email or web address]   Hart Robinsons [place] (daughter) needs letter for travel.  Initial letter went to wife who was declared too old to travel.   Critical care time:  32 minutes    Canary Brim, MSN, NP-C [facility] 12/29/2019, 8:24 AM   Please see [email or web address] for pager details.

## 2019-12-30 ENCOUNTER — Inpatient Hospital Stay (HOSPITAL_COMMUNITY): Payer: HRSA Program

## 2019-12-30 DIAGNOSIS — J1282 Pneumonia due to coronavirus disease 2019: Secondary | ICD-10-CM

## 2019-12-30 LAB — BASIC METABOLIC PANEL
Anion gap: 7 (ref 5–15)
BUN: 28 mg/dL — ABNORMAL HIGH (ref 8–23)
CO2: 36 mmol/L — ABNORMAL HIGH (ref 22–32)
Calcium: 7.6 mg/dL — ABNORMAL LOW (ref 8.9–10.3)
Chloride: 94 mmol/L — ABNORMAL LOW (ref 98–111)
Creatinine, Ser: 0.89 mg/dL (ref 0.61–1.24)
GFR calc Af Amer: >60 mL/min (ref >60)
GFR calc non Af Amer: >60 mL/min (ref >60)
Glucose, Bld: 276 mg/dL — ABNORMAL HIGH (ref 70–99)
Potassium: 4.8 mmol/L (ref 3.5–5.1)
Sodium: 137 mmol/L (ref 135–145)

## 2019-12-30 LAB — GLUCOSE, CAPILLARY
Glucose-Capillary: 219 mg/dL — ABNORMAL HIGH (ref 70–99)
Glucose-Capillary: 235 mg/dL — ABNORMAL HIGH (ref 70–99)
Glucose-Capillary: 244 mg/dL — ABNORMAL HIGH (ref 70–99)
Glucose-Capillary: 282 mg/dL — ABNORMAL HIGH (ref 70–99)
Glucose-Capillary: 285 mg/dL — ABNORMAL HIGH (ref 70–99)
Glucose-Capillary: 326 mg/dL — ABNORMAL HIGH (ref 70–99)

## 2019-12-30 LAB — CBC
HCT: 37.9 % — ABNORMAL LOW (ref 39.0–52.0)
Hemoglobin: 11.5 g/dL — ABNORMAL LOW (ref 13.0–17.0)
MCH: 29.4 pg (ref 26.0–34.0)
MCHC: 30.3 g/dL (ref 30.0–36.0)
MCV: 96.9 fL (ref 80.0–100.0)
Platelets: 166 10*3/uL (ref 150–400)
RBC: 3.91 MIL/uL — ABNORMAL LOW (ref 4.22–5.81)
RDW: 16.9 % — ABNORMAL HIGH (ref 11.5–15.5)
WBC: 7.8 10*3/uL (ref 4.0–10.5)
nRBC: 0 % (ref 0.0–0.2)

## 2019-12-30 LAB — PHOSPHORUS: Phosphorus: 4 mg/dL (ref 2.5–4.6)

## 2019-12-30 LAB — FERRITIN: Ferritin: 379 ng/mL — ABNORMAL HIGH (ref 24–336)

## 2019-12-30 LAB — MAGNESIUM: Magnesium: 2.2 mg/dL (ref 1.7–2.4)

## 2019-12-30 MED ORDER — INSULIN ASPART 100 UNIT/ML ~~LOC~~ SOLN
2.0000 [IU] | SUBCUTANEOUS | Status: DC
  Administered 2019-12-30 – 2020-01-02 (×18): 2 [IU] via SUBCUTANEOUS

## 2019-12-30 MED ORDER — FUROSEMIDE 10 MG/ML IJ SOLN
40.0000 mg | Freq: Two times a day (BID) | INTRAMUSCULAR | Status: AC
  Administered 2019-12-30 – 2019-12-31 (×2): 40 mg via INTRAVENOUS
  Filled 2019-12-30 (×2): qty 4

## 2019-12-30 MED ORDER — HEPARIN SODIUM (PORCINE) 10000 UNIT/ML IJ SOLN
7500.0000 [IU] | Freq: Three times a day (TID) | INTRAMUSCULAR | Status: DC
  Administered 2019-12-30 – 2020-01-04 (×14): 7500 [IU] via SUBCUTANEOUS
  Filled 2019-12-30 (×14): qty 1

## 2019-12-30 MED ORDER — SODIUM CHLORIDE 0.9% FLUSH: 10.0000 mL | INTRAVENOUS | Status: DC | PRN

## 2019-12-30 MED ORDER — SODIUM CHLORIDE 0.9% FLUSH
10.0000 mL | Freq: Two times a day (BID) | INTRAVENOUS | Status: DC
  Administered 2019-12-30 – 2020-01-03 (×9): 10 mL

## 2019-12-30 MED ORDER — SODIUM CHLORIDE 0.9 % IV SOLN
1.0000 mg/kg/h | INTRAVENOUS | Status: DC
  Administered 2019-12-30 (×2): 0.5 mg/kg/h via INTRAVENOUS
  Administered 2019-12-31 (×2): 1 mg/kg/h via INTRAVENOUS
  Administered 2019-12-31: 10:00:00 0.5 mg/kg/h via INTRAVENOUS
  Administered 2020-01-01 (×2): 1 mg/kg/h via INTRAVENOUS
  Filled 2019-12-30 (×14): qty 5

## 2019-12-30 MED ORDER — INSULIN ASPART 100 UNIT/ML ~~LOC~~ SOLN
0.0000 [IU] | SUBCUTANEOUS | Status: DC
  Administered 2019-12-30 (×2): 5 [IU] via SUBCUTANEOUS
  Administered 2019-12-31: 2 [IU] via SUBCUTANEOUS
  Administered 2019-12-31: 3 [IU] via SUBCUTANEOUS
  Administered 2019-12-31 (×2): 5 [IU] via SUBCUTANEOUS
  Administered 2019-12-31: 8 [IU] via SUBCUTANEOUS
  Administered 2019-12-31: 08:00:00 3 [IU] via SUBCUTANEOUS
  Administered 2020-01-01: 11 [IU] via SUBCUTANEOUS
  Administered 2020-01-01 – 2020-01-02 (×6): 8 [IU] via SUBCUTANEOUS
  Administered 2020-01-02: 11 [IU] via SUBCUTANEOUS
  Administered 2020-01-02 (×2): 8 [IU] via SUBCUTANEOUS

## 2019-12-30 NOTE — Progress Notes (Signed)
Pt was with sats in 80s and HR in 120's at shift change. Pt appeared same yesterday at this time.  Nimbex was restarted and Metoprolol given (early).  Pt now with sats in 90s and HR in upper 80s.

## 2019-12-30 NOTE — Progress Notes (Signed)
Peripherally Inserted Central Catheter/Midline Placement  The IV Nurse has discussed with the patient and/or persons authorized to consent for the patient, the purpose of this procedure and the potential benefits and risks involved with this procedure.  The benefits include less needle sticks, lab draws from the catheter, and the patient may be discharged home with the catheter. Risks include, but not limited to, infection, bleeding, blood clot (thrombus formation), and puncture of an artery; nerve damage and irregular heartbeat and possibility to perform a PICC exchange if needed/ordered by physician.  Alternatives to this procedure were also discussed.  Bard Power PICC patient education guide, fact sheet on infection prevention and patient information card has been provided to patient /or left at bedside. PICC placed as medically necessary as per Dr. Kendrick Fries MD   PICC/Midline Placement Documentation  PICC Double Lumen 12/30/19 PICC Right Basilic 45 cm 1 cm (Active)  Indication for Insertion or Continuance of Line Vasoactive infusions 12/30/19 2034  Exposed Catheter (cm) 1 cm 12/30/19 2034  Site Assessment Clean;Dry;Intact 12/30/19 2034  Lumen #1 Status Flushed;Saline locked;Blood return noted 12/30/19 2034  Lumen #2 Status Flushed;Saline locked;Blood return noted 12/30/19 2034  Dressing Type Transparent 12/30/19 2034  Dressing Status Clean;Dry;Intact;Antimicrobial disc in place 12/30/19 2034  Dressing Change Due 01/06/20 12/30/19 2034       [name] 12/30/2019, 8:37 PM

## 2019-12-30 NOTE — Care Management (Addendum)
CM acknowledges consult for an additonal letter for family to come to [place] for visitation for patient.  Letter in progress  Update:  CM emailed letter to [facility] for MD to sign.    Update 1350: CM received signed letter from [facility] for pts daughter Henry [name].   CM spoke with PCCM NP: email address was confirmed via translator during GOC call.  CM emailed signed note from Dr Kendrick Fries as requested to email on consult.  CM attempted to call Henry [name]/family with the assistance of an interpretor however was unable to reach them - interpretor left a VM requesting a callback to CM.  Attending plans to have another GOC discussion with family today if possible.

## 2019-12-30 NOTE — Progress Notes (Signed)
.   NAMECandy [name], MRN:  [id number], DOB:  [birth year], LOS: 48 ADMISSION DATE:  11/18/2019, CONSULTATION DATE:  12/31 REFERRING MD:  Sherral Hammers, CHIEF COMPLAINT:  Progressive respiratory failure    Brief History   64 year old non-English-speaking male admitted on 12/17 with COVID PNA, transferred to the intensive care on 12/31 with acute decompensation.    From time of admission to 1/13 Completed 5 days of remdesivir Completed 10 days of systemic steroids Transfused with convalescent plasma Received Actemra Never able to titrate off high flow oxygen Empiric antibiotics initiated on the 27th for presumptive aspiration Treated for serratia HCAP Vent settings have remained high, no real change with prone therapy   Past Medical History  Type 2 diabetes poorly controlled Spanish-speaking only   Significant Hospital Events   12/17 Admitted.  Started on remdesivir, and systemic steroids 12/20 Transferred out of ICU to PCU 12/21 Received 1 unit of convalescent plasma, Actemra x1; still needing high flow oxygen  12/27 Asymmetric airspace disease --> presumptive aspiration event. Unasyn started.  12/31 Progressive WOB, saturations in 70s with respiratory rate in the 40s still on high flow oxygen at 40 L transferred emergently to intensive care intubated initial plateau pressure 52 1/01 Required norepinephrine, had decreased PaO2 while proned. FiO2 0.90, PEEP 12 1/04 Vt decreased to 6 cc/kg-->pplat 32 1/05 Plateau pressures 34.  Requiring frequent hourly NMB, neuromuscular drip infusion reinitiated 1/06 Hypoglycemia requiring D10, P/F ratio <150, proning protocol continued.  Left foot cold to palpation however did have strong posterior pedal and tibial pulses.  Empiric IV heparin started given concern for potential reduced arterial flow.  There is borderline ST elevation in lateral leads twelve-lead EKG, given maximized oxygen and PEEP requirements not a candidate for any intervention at this  point.  1/07 Cardiac enzymes negative.  Foot now warm. 1/08 Added hydralazine and increased beta blocker  1/12 Long family discussion regarding plan of care with son, two daughters.  Did not tolerate NMB break  Consults:  PCCM 12/31  SLP   Procedures:  ETT 12/31 >> Right IJ TLC 12/31 >> Radial A-line 12/31 >> removed  Significant Diagnostic Tests:  CTA chest 12/18 >> Neg for PE, progressive bilateral lung opacities. Multifocal ground-glass opacities with superimposed consolidations in the dependent lower lobes with air bronchograms. Pattern consistent with COVID-19 pneumonia, superimposed bacterial infection in the lower lobes is also considered. Coronary artery calcifications. Echocardiogram 1/1 >> LVEF 19-41%, grade 1 diastolic dysfunction, moderately elevated PASP with globally normal RV function  Micro Data:  POC COVID 12/17 >> positive BCx2 12/17 >> negative  MRSA screen 12/18 >> negative Resp culture 12/31 >> Moderate Serratia Marcecens >> S- ceftriaxone, R-cefazolin  Antimicrobials:  Unasyn 12/27 >> 12/31 Cefepime 12/31 >> 1/3  Vanc 12/31 >> 1/2 Remdesivir from 12/27 through 12/31 (completed 5 days)  Interim history/subjective:  Tmax 97.3  Glucose range - 145-285 I/O - UOP 914ml, +1.5L in 24h PEEP 14, FiO2 100%, Peak 46, Pplat 43  Objective   Blood pressure 139/85, pulse 96, temperature (!) 97.3 F (36.3 C), temperature source Esophageal, resp. rate (!) 34, height 5\' 5"  (1.651 m), weight 84.2 kg, SpO2 95 %. CVP:  [10 mmHg-19 mmHg] 16 mmHg  Vent Mode: PRVC FiO2 (%):  [70 %-100 %] 100 % Set Rate:  [35 bmp] 35 bmp Vt Set:  [440 mL] 440 mL PEEP:  [14 cmH20] 14 cmH20 Plateau Pressure:  [38 cmH20] 38 cmH20   Intake/Output Summary (Last 24 hours) at 12/30/2019 0829 Last data  filed at 12/30/2019 0800 Gross per 24 hour  Intake 3234.32 ml  Output 1465 ml  Net 1769.32 ml   Filed Weights   12/27/19 0500 12/29/19 0500 12/30/19 0448  Weight: 82.4 kg 83.3 kg 84.2 kg     Examination: General: critically ill appearing adult male lying in bed on vent in NAD HEENT: MM pink/moist, ETT Neuro: sedate, paralyzed  CV: s1s2 RRR, no m/r/g PULM:  Non-labored, equal breath sounds bilaterally, coarse  GI: soft, bsx4 active  Extremities: warm/dry, generalized 1+ edema  Skin: no rashes or lesions  Resolved Hospital Problem list   Multifactorial shock off pressors as of 1/5 Serratia HCAP completed antibiotics on 1/6  Assessment & Plan:   Acute Hypoxic Respiratory Failure secondary to COVID PNA with ARDS Ongoing concern for dysphagia documented.  High Peak/plateau since intubation -low Vt ventilation 4-8cc/kg -goal plateau pressure <30, driving pressure <35 cm T6R -target PaO2 55-65, titrate PEEP/FiO2 per ARDS protocol  -VAP prevention measures  -follow intermittent CXR  -lasix 40 mg IV BID x2 doses -will need tracheostomy once vent settings weaned to a point where he will tolerate the procedure  Sedation Needs while on Vent  -PAD protocol  -repeat trial NMB infusion break 1/14  -PT oxycodone, klonopin   Possible ST elevation MI, with mild elevation of lateral leads HTN Hemodynamically stable, cardiac enzymes negative -continue ASA, beta blocker -PRN hydralazine   Concern for Cold L Foot Did have decent doppler signal, foot remains warm with palpable pulse -follow clinical exam   Epistaxis  Packing removed 1/13  -follow with nasal packing removed   Intermittent fluid and electrolyte imbalance: total body overload  Sodium normalized with free water, BUN/creatinine remain stable, he still volume positive -continue free water -Trend BMP / urinary output -Replace electrolytes as indicated -Avoid nephrotoxic agents, ensure adequate renal perfusion  DM II with Hyperglycemia Episodes of hypoglycemia, glucoses improved off levemir  -SSI, very sensitive scale   Mild Thrombocytopenia -trend CBC  Moderate Protein Calorie Malnutrition in setting  of Prolonged Critical Illness -TF per Nutrition  IV access -pending PICC line placement  Best practice:  Diet: TF Pain/Anxiety/Delirium protocol (if indicated): Versed, dilaudid VAP protocol (if indicated): in place DVT prophylaxis: Low molecular weight heparin GI prophylaxis: PPI Glucose control: Sliding scale insulin Mobility: Bedrest Code Status: Full code Family Communication: Family updated 1/14 per Dr. Kendrick Fries.   Family update 1/12 >Son - Yolanda Bonine, Daughter - [name] & daughter-in-law Farrell Ours updated via phone 1/12 with intrepreter utilized.  [name] (family friend->has been speaking to family in [place] for [place] and they give permission for this).  Informed family that he has been critically ill for some time but currently has single organ failure with possibility of ~ 50% survival with long road for recovery. They would be accepting of tracheostomy and verbally gave permission over the phone.  Reviewed that he is too ill at this point to perform the procedure and they indicate understanding. Reviewed concept of CPR with family and they indicate they would want CPR in the event of cardiac arrest if we as medical providers felt it would make a difference.  Disposition: ICU.     Daughter in [place] email:  [email or web address]   Hart Robinsons Lillie (daughter) needs letter for travel > RN case mgr sent on 1/14.  Initial letter went to wife who was declared too old to travel.   Critical care time:  32 minutes    Canary Brim, MSN, NP-C [facility] 12/30/2019, 8:29 AM   Please see [email or web address] for pager details.

## 2019-12-31 ENCOUNTER — Inpatient Hospital Stay (HOSPITAL_COMMUNITY): Payer: HRSA Program

## 2019-12-31 LAB — CBC
HCT: 37.5 % — ABNORMAL LOW (ref 39.0–52.0)
Hemoglobin: 11.1 g/dL — ABNORMAL LOW (ref 13.0–17.0)
MCH: 29.5 pg (ref 26.0–34.0)
MCHC: 29.6 g/dL — ABNORMAL LOW (ref 30.0–36.0)
MCV: 99.7 fL (ref 80.0–100.0)
Platelets: 189 10*3/uL (ref 150–400)
RBC: 3.76 MIL/uL — ABNORMAL LOW (ref 4.22–5.81)
RDW: 17 % — ABNORMAL HIGH (ref 11.5–15.5)
WBC: 9.4 10*3/uL (ref 4.0–10.5)
nRBC: 0 % (ref 0.0–0.2)

## 2019-12-31 LAB — GLUCOSE, CAPILLARY
Glucose-Capillary: 143 mg/dL — ABNORMAL HIGH (ref 70–99)
Glucose-Capillary: 174 mg/dL — ABNORMAL HIGH (ref 70–99)
Glucose-Capillary: 182 mg/dL — ABNORMAL HIGH (ref 70–99)
Glucose-Capillary: 225 mg/dL — ABNORMAL HIGH (ref 70–99)
Glucose-Capillary: 240 mg/dL — ABNORMAL HIGH (ref 70–99)
Glucose-Capillary: 259 mg/dL — ABNORMAL HIGH (ref 70–99)
Glucose-Capillary: 309 mg/dL — ABNORMAL HIGH (ref 70–99)

## 2019-12-31 LAB — BASIC METABOLIC PANEL
Anion gap: 8 (ref 5–15)
BUN: 44 mg/dL — ABNORMAL HIGH (ref 8–23)
CO2: 38 mmol/L — ABNORMAL HIGH (ref 22–32)
Calcium: 7.6 mg/dL — ABNORMAL LOW (ref 8.9–10.3)
Chloride: 94 mmol/L — ABNORMAL LOW (ref 98–111)
Creatinine, Ser: 1.2 mg/dL (ref 0.61–1.24)
GFR calc Af Amer: >60 mL/min (ref >60)
GFR calc non Af Amer: >60 mL/min (ref >60)
Glucose, Bld: 157 mg/dL — ABNORMAL HIGH (ref 70–99)
Potassium: 5 mmol/L (ref 3.5–5.1)
Sodium: 140 mmol/L (ref 135–145)

## 2019-12-31 LAB — PHOSPHORUS: Phosphorus: 5.8 mg/dL — ABNORMAL HIGH (ref 2.5–4.6)

## 2019-12-31 LAB — FERRITIN: Ferritin: 442 ng/mL — ABNORMAL HIGH (ref 24–336)

## 2019-12-31 LAB — MAGNESIUM: Magnesium: 2.5 mg/dL — ABNORMAL HIGH (ref 1.7–2.4)

## 2019-12-31 MED ORDER — OXYCODONE HCL 5 MG PO TABS
10.0000 mg | ORAL_TABLET | Freq: Four times a day (QID) | ORAL | Status: DC
  Administered 2019-12-31 – 2020-01-04 (×14): 10 mg
  Filled 2019-12-31 (×14): qty 2

## 2019-12-31 MED ORDER — SODIUM ZIRCONIUM CYCLOSILICATE 5 G PO PACK
5.0000 g | PACK | Freq: Once | ORAL | Status: AC
  Administered 2019-12-31: 5 g via ORAL
  Filled 2019-12-31: qty 1

## 2019-12-31 MED ORDER — VECURONIUM BROMIDE 10 MG IV SOLR
0.1000 mg/kg | INTRAVENOUS | Status: DC | PRN
  Administered 2019-12-31 – 2020-01-02 (×15): 8.5 mg via INTRAVENOUS
  Filled 2019-12-31 (×16): qty 10

## 2019-12-31 NOTE — Progress Notes (Addendum)
.   NAMEElzy [name], MRN:  [id number], DOB:  11/25/1956, LOS: 37 ADMISSION DATE:  12-26-19, CONSULTATION DATE:  12/31 REFERRING MD:  Sherral Hammers, CHIEF COMPLAINT:  Progressive respiratory failure    Brief History   64 year old non-English-speaking male admitted on 12/17 with COVID PNA, transferred to the intensive care on 12/31 with acute decompensation.    From time of admission to 1/13 Completed 5 days of remdesivir Completed 10 days of systemic steroids Transfused with convalescent plasma Received Actemra Never able to titrate off high flow oxygen Empiric antibiotics initiated on the 27th for presumptive aspiration Treated for serratia HCAP Vent settings have remained high, no real change with prone therapy.  Past Medical History  Type 2 diabetes poorly controlled Spanish-speaking only   Significant Hospital Events   12/17 Admitted.  Started on remdesivir, and systemic steroids 12/20 Transferred out of ICU to PCU 12/21 Received 1 unit of convalescent plasma, Actemra x1; still needing high flow oxygen  12/27 Asymmetric airspace disease --> presumptive aspiration event. Unasyn started.  12/31 Progressive WOB, saturations in 70s with respiratory rate in the 40s still on high flow oxygen at 40 L transferred emergently to intensive care intubated initial plateau pressure 52 1/01 Required norepinephrine, had decreased PaO2 while proned. FiO2 0.90, PEEP 12 1/04 Vt decreased to 6 cc/kg-->pplat 32 1/05 Plateau pressures 34.  Requiring frequent hourly NMB, neuromuscular drip infusion reinitiated 1/06 Hypoglycemia requiring D10, P/F ratio <150, proning protocol continued.  Left foot cold to palpation however did have strong posterior pedal and tibial pulses.  Empiric IV heparin started given concern for potential reduced arterial flow.  There is borderline ST elevation in lateral leads twelve-lead EKG, given maximized oxygen and PEEP requirements not a candidate for any intervention at this  point.  1/07 Cardiac enzymes negative.  Foot now warm. 1/08 Added hydralazine and increased beta blocker  1/12 Long family discussion regarding plan of care with son, two daughters.  Did not tolerate NMB break 1/15 NMB stopped, PRN initiated, ketamine dosing increased   Consults:  PCCM 12/31  SLP   Procedures:  ETT 12/31 >> Right IJ TLC 12/31 >> Radial A-line 12/31 >> removed RUE PICC 1/14 >>   Significant Diagnostic Tests:  CTA chest 12/18 >> Neg for PE, progressive bilateral lung opacities. Multifocal ground-glass opacities with superimposed consolidations in the dependent lower lobes with air bronchograms. Pattern consistent with COVID-19 pneumonia, superimposed bacterial infection in the lower lobes is also considered. Coronary artery calcifications. Echocardiogram 1/1 >> LVEF 64-40%, grade 1 diastolic dysfunction, moderately elevated PASP with globally normal RV function  Micro Data:  POC COVID 12/17 >> positive BCx2 12/17 >> negative  MRSA screen 12/18 >> negative Resp culture 12/31 >> Moderate Serratia Marcecens >> S- ceftriaxone, R-cefazolin  Antimicrobials:  Unasyn 12/27 >> 12/31 Cefepime 12/31 >> 1/3  Vanc 12/31 >> 1/2 Remdesivir from 12/27 through 12/31 (completed 5 days)  Interim history/subjective:  Tmax 100.6  Glucose range - 140-285 I/O - UOP 2.5L, +233ml in 24h PEEP 14, FiO2 100%, Peak 43, Pplat 42 Desaturation episodes intermittently.   Objective   Blood pressure 119/78, pulse 99, temperature 98.8 F (37.1 C), resp. rate (!) 35, height 5\' 5"  (1.651 m), weight 84.6 kg, SpO2 93 %. CVP:  [4 mmHg-18 mmHg] 14 mmHg  Vent Mode: PRVC FiO2 (%):  [90 %] 90 % Set Rate:  [35 bmp] 35 bmp Vt Set:  [370 mL-440 mL] 370 mL PEEP:  [14 cmH20] 14 cmH20 Plateau Pressure:  [27 cmH20-43 cmH20]  35 cmH20   Intake/Output Summary (Last 24 hours) at 12/31/2019 0800 Last data filed at 12/31/2019 0600 Gross per 24 hour  Intake 2827.88 ml  Output 2845 ml  Net -17.12 ml    Filed Weights   12/29/19 0500 12/30/19 0448 12/31/19 0443  Weight: 83.3 kg 84.2 kg 84.6 kg    Examination: General: adult male lying in bed on vent, critically ill  HEENT: MM pink/moist, ETT, mild scleral edema, pupils reactive  Neuro: sedate  CV: s1s2 RRR, no m/r/g PULM:  Non-labored on vent, lungs bilaterally diminished but equal breath sounds GI: soft, bsx4 active  Extremities: warm/dry, trace to 1+ edema, feet warm with equal pulses Skin: no rashes or lesions  CXR 1/15 - images personally reviewed, ETT, PICC line and feeding tube in good position, diffuse bilateral opacities (appearance of fibrotic phase of ARDS)  Resolved Hospital Problem list   Multifactorial shock off pressors as of 1/5 Serratia HCAP completed antibiotics on 1/6 Mild Thrombocytopenia Epistaxis - packing removed 1/13, no further bleeding  Concern for Pulseless Foot - doppler positive, foot warm with pulses  Assessment & Plan:   Acute Hypoxic Respiratory Failure secondary to COVID PNA with ARDS Ongoing concern for dysphagia documented.  High Peak/plateau since intubation. Prone therapy did not help with oxygenation.  -low Vt ventilation 4-8cc/kg -goal plateau pressure <30, driving pressure <73 cm Z3G -target PaO2 55-65, titrate PEEP/FiO2 per ARDS protocol  -goal CVP <4, diuresis as necessary -VAP prevention measures  -follow intermittent CXR  -will need trach once vent needs weaned to point where he can safely undergo procedure  -hold lasix 1/15  Sedation Needs while on Vent  -PAD protocol with dilaudid, ketamine and versed -change NMB to PRN dosing -PT klonopin, increase oxycodone to 10mg  Q6  Possible ST elevation MI, with mild elevation of lateral leads HTN Hemodynamically stable, cardiac enzymes negative -ASA, beta blocker -PRN hydralazine   Intermittent fluid and electrolyte imbalance: total body overload  Sodium normalized with free water, BUN/creatinine remain stable, he still volume  positive balance.   -continue free water  -hold lasix 1/15 -lokelma x1  -Trend BMP / urinary output -Replace electrolytes as indicated -Avoid nephrotoxic agents, ensure adequate renal perfusion  DM II with Hyperglycemia Episodes of hypoglycemia, glucoses improved off levemir  -SSI   Moderate Protein Calorie Malnutrition in setting of Prolonged Critical Illness -TF per Nutrition   Best practice:  Diet: TF Pain/Anxiety/Delirium protocol (if indicated): Versed, dilaudid VAP protocol (if indicated): in place DVT prophylaxis: Low molecular weight heparin GI prophylaxis: PPI Glucose control: Sliding scale insulin Mobility: Bedrest Code Status: Full code Family Communication: 2/15 updated 1/15 Son - 2/15, Daughter - [name] & daughter-in-law Nohemi. Yolanda Bonine (family friend->has been speaking to family in [name] for [place] and they give permission for this).  Disposition: ICU.    Critical care time: 30 minutes    [place], MSN, NP-C [facility] 12/31/2019, 8:00 AM   Please see [email or web address] for pager details.

## 2020-01-01 LAB — MAGNESIUM: Magnesium: 2.7 mg/dL — ABNORMAL HIGH (ref 1.7–2.4)

## 2020-01-01 LAB — BASIC METABOLIC PANEL
Anion gap: 6 (ref 5–15)
BUN: 48 mg/dL — ABNORMAL HIGH (ref 8–23)
CO2: 39 mmol/L — ABNORMAL HIGH (ref 22–32)
Calcium: 7.9 mg/dL — ABNORMAL LOW (ref 8.9–10.3)
Chloride: 96 mmol/L — ABNORMAL LOW (ref 98–111)
Creatinine, Ser: 0.89 mg/dL (ref 0.61–1.24)
GFR calc Af Amer: >60 mL/min (ref >60)
GFR calc non Af Amer: >60 mL/min (ref >60)
Glucose, Bld: 286 mg/dL — ABNORMAL HIGH (ref 70–99)
Potassium: 5.3 mmol/L — ABNORMAL HIGH (ref 3.5–5.1)
Sodium: 141 mmol/L (ref 135–145)

## 2020-01-01 LAB — CBC
HCT: 35.7 % — ABNORMAL LOW (ref 39.0–52.0)
Hemoglobin: 10.1 g/dL — ABNORMAL LOW (ref 13.0–17.0)
MCH: 29 pg (ref 26.0–34.0)
MCHC: 28.3 g/dL — ABNORMAL LOW (ref 30.0–36.0)
MCV: 102.6 fL — ABNORMAL HIGH (ref 80.0–100.0)
Platelets: 229 10*3/uL (ref 150–400)
RBC: 3.48 MIL/uL — ABNORMAL LOW (ref 4.22–5.81)
RDW: 16.6 % — ABNORMAL HIGH (ref 11.5–15.5)
WBC: 11.2 10*3/uL — ABNORMAL HIGH (ref 4.0–10.5)
nRBC: 0 % (ref 0.0–0.2)

## 2020-01-01 LAB — GLUCOSE, CAPILLARY
Glucose-Capillary: 256 mg/dL — ABNORMAL HIGH (ref 70–99)
Glucose-Capillary: 300 mg/dL — ABNORMAL HIGH (ref 70–99)
Glucose-Capillary: 264 mg/dL — ABNORMAL HIGH (ref 70–99)
Glucose-Capillary: 270 mg/dL — ABNORMAL HIGH (ref 70–99)

## 2020-01-01 LAB — FERRITIN: Ferritin: 449 ng/mL — ABNORMAL HIGH (ref 24–336)

## 2020-01-01 LAB — PHOSPHORUS: Phosphorus: 2.9 mg/dL (ref 2.5–4.6)

## 2020-01-01 MED ORDER — SODIUM CHLORIDE 0.9 % IV SOLN
1.0000 mg/kg/h | INTRAVENOUS | Status: DC
  Administered 2020-01-01: 19:00:00 1 mg/kg/h via INTRAVENOUS
  Filled 2020-01-01 (×2): qty 25

## 2020-01-01 MED ORDER — FUROSEMIDE 10 MG/ML IJ SOLN
40.0000 mg | Freq: Every day | INTRAMUSCULAR | Status: DC
  Administered 2020-01-01 – 2020-01-03 (×3): 40 mg via INTRAVENOUS
  Filled 2020-01-01 (×3): qty 4

## 2020-01-01 MED ORDER — SODIUM ZIRCONIUM CYCLOSILICATE 10 G PO PACK
10.0000 g | PACK | Freq: Once | ORAL | Status: AC
  Administered 2020-01-01: 10 g via ORAL
  Filled 2020-01-01: qty 1

## 2020-01-01 NOTE — Progress Notes (Signed)
.   NAMEUsbaldo [name], MRN:  [id number], DOB:  30-Dec-1955, LOS: 30 ADMISSION DATE:  12/01/2019, CONSULTATION DATE:  12/31 REFERRING MD:  Joseph Art, CHIEF COMPLAINT:  Progressive respiratory failure    Brief History   64 year old non-English-speaking male admitted on 12/17 with COVID PNA, transferred to the intensive care on 12/31 with acute decompensation.    Completed 5 days of remdesivir Completed 10 days of systemic steroids Transfused with convalescent plasma Received Actemra Never able to titrate off high flow oxygen Empiric antibiotics initiated on the 27th for presumptive aspiration Treated for serratia HCAP PEEP/FIO2 & plateau pressures have remained high, no real change with prone therapy.  Past Medical History  Type 2 diabetes poorly controlled Spanish-speaking only   Significant Hospital Events   12/17 Admitted.  Started on remdesivir, and systemic steroids 12/20 Transferred out of ICU to PCU 12/21 Received 1 unit of convalescent plasma, Actemra x1; still needing high flow oxygen  12/27 Asymmetric airspace disease --> presumptive aspiration event. Unasyn started.  12/31 Progressive WOB, saturations in 70s with respiratory rate in the 40s still on high flow oxygen at 40 L transferred emergently to intensive care intubated initial plateau pressure 52 1/01 Required norepinephrine, had decreased PaO2 while proned. FiO2 0.90, PEEP 12 1/04 Vt decreased to 6 cc/kg-->pplat 32 1/05 Plateau pressures 34.  Requiring frequent hourly NMB, neuromuscular drip infusion reinitiated 1/06 Hypoglycemia requiring D10, P/F ratio <150, proning protocol continued.  Left foot cold to palpation however did have strong posterior pedal and tibial pulses.  Empiric IV heparin started given concern for potential reduced arterial flow.  There is borderline ST elevation in lateral leads twelve-lead EKG, given maximized oxygen and PEEP requirements not a candidate for any intervention at this point.  1/07  Cardiac enzymes negative.  Foot now warm. 1/08 Added hydralazine and increased beta blocker  1/12 Long family discussion regarding plan of care with son, two daughters.  Did not tolerate NMB break 1/15 NMB stopped, PRN initiated, ketamine dosing increased   Consults:  PCCM 12/31  SLP   Procedures:  ETT 12/31 >> Right IJ TLC 12/31 >> Radial A-line 12/31 >> removed RUE PICC 1/14 >>   Significant Diagnostic Tests:  CTA chest 12/18 >> Neg for PE, progressive bilateral lung opacities. Multifocal ground-glass opacities with superimposed consolidations in the dependent lower lobes with air bronchograms. Pattern consistent with COVID-19 pneumonia, superimposed bacterial infection in the lower lobes is also considered. Coronary artery calcifications. Echocardiogram 1/1 >> LVEF 60-65%, grade 1 diastolic dysfunction, moderately elevated PASP with globally normal RV function  Micro Data:  POC COVID 12/17 >> positive BCx2 12/17 >> negative  MRSA screen 12/18 >> negative Resp culture 12/31 >> Moderate Serratia Marcecens >> S- ceftriaxone, R-cefazolin  Antimicrobials:  Unasyn 12/27 >> 12/31 Cefepime 12/31 >> 1/3  Vanc 12/31 >> 1/2 Remdesivir 12/27 through 12/31  Interim history/subjective:   Remains critically ill. Afebrile, intubated, high peak and plateau pressures 42 and 38 On 100%/PEEP of 14 RT reports desaturation this morning Received intermittent vecuronium per RN  Objective   Blood pressure 126/74, pulse (!) 127, temperature 99.5 F (37.5 C), temperature source Oral, resp. rate (!) 39, height 5\' 5"  (1.651 m), weight 86.2 kg, SpO2 (!) 85 %.    Vent Mode: PRVC FiO2 (%):  [100 %] 100 % Set Rate:  [35 bmp] 35 bmp Vt Set:  [3 mL-370 mL] 370 mL PEEP:  [14 cmH20] 14 cmH20 Plateau Pressure:  [36 cmH20-42 cmH20] 42 cmH20   Intake/Output Summary (  Last 24 hours) at 01/01/2020 1226 Last data filed at 01/01/2020 0600 Gross per 24 hour  Intake 1869.59 ml  Output 3450 ml  Net -1580.41  ml   Filed Weights   12/30/19 0448 12/31/19 0443 01/01/20 0500  Weight: 84.2 kg 84.6 kg 86.2 kg    Examination: General: adult male lying in bed on vent, critically ill  HEENT: MM pink/moist, oral ETT, mild scleral edema, pupils reactive  Neuro: RASS -5, synchronous with vent CV: s1s2 RRR, no m/r/g PULM: No accessory muscle use, lungs bilaterally diminished but equal breath sounds GI: soft, bsx4 active  Extremities: warm/dry, 2+ edema, feet warm with equal pulses Skin: Rash over legs?  Chlorhexidine induced per RN  CXR 1/15 personally reviewed-stable bilateral hazy multifocal airspace disease.  Labs show mild hyperkalemia, hyperglycemia, improved creatinine, mild leukocytosis, stable anemia  Resolved Hospital Problem list   Multifactorial shock off pressors as of 1/5 Serratia HCAP completed antibiotics on 1/6 Mild Thrombocytopenia Epistaxis - packing removed 1/13, no further bleeding  Concern for Pulseless Foot - doppler positive, foot warm with pulses  Assessment & Plan:   ARDS due to to COVID PNA  High Peak/plateau pressures since intubation. Prone therapy did not help with oxygenation.  -Continue lung protective ventilation, tidal volume has been lowered to 6 cc/kg but plateau pressures remain high -Use intermittent paralytic for vent synchrony -VAP prevention measures  -will need trach once vent needs weaned to point where he can safely undergo procedure   Sedation Needs while on Vent  -PAD protocol with dilaudid, ketamine and versed, goal RASS -5 -change NMB to PRN dosing -PT klonopin, increase oxycodone to 10mg  Q6  Possible ST elevation MI, with mild elevation of lateral leads HTN Hemodynamically stable, cardiac enzymes negative -ASA, beta blocker -PRN hydralazine   Intermittent fluid and electrolyte imbalance: 4 L positive -continue free water  -resume lasix 1/16 -Received lokelma x1 on 1/15 for mild hyperkalemia, repeat today -Trend BMP / urinary  output -Replace electrolytes as indicated -Avoid nephrotoxic agents, ensure adequate renal perfusion  Abdominal distention -x-ray abdomen in a.m.  DM II with Hyperglycemia Episodes of hypoglycemia, glucoses improved off levemir  -SSI   Moderate Protein Calorie Malnutrition in setting of Prolonged Critical Illness -TF per Nutrition   Best practice:  Diet: TF Pain/Anxiety/Delirium protocol (if indicated): Versed, dilaudid, ketamine, RASS goal -5 VAP protocol (if indicated): in place DVT prophylaxis: Low molecular weight heparin GI prophylaxis: PPI Glucose control: Sliding scale insulin Mobility: Bedrest Code Status: Full code Family Communication: Donell Beers updated 1/15 Son - Fortino Sic, Daughter - [name] & daughter-in-law Nohemi. [name] Dy (family friend->has been speaking to family in [place] for [place] and they give permission for this).  Disposition: ICU.     The patient is critically ill with multiple organ systems failure and requires high complexity decision making for assessment and support, frequent evaluation and titration of therapies, application of advanced monitoring technologies and extensive interpretation of multiple databases. Critical Care Time devoted to patient care services described in this note independent of APP/resident  time is 35 minutes.   Kara Mead MD. Shade Flood. [facility]  If no response to pager , please call [phone]   01/01/2020

## 2020-01-01 NOTE — Progress Notes (Signed)
Spoke with pt friend [name], who has been updating family. Discussed pt status. All nursing questions answered.

## 2020-01-02 ENCOUNTER — Inpatient Hospital Stay (HOSPITAL_COMMUNITY): Payer: HRSA Program

## 2020-01-02 LAB — CBC
HCT: 36.4 % — ABNORMAL LOW (ref 39.0–52.0)
Hemoglobin: 10.1 g/dL — ABNORMAL LOW (ref 13.0–17.0)
MCH: 29.4 pg (ref 26.0–34.0)
MCHC: 27.7 g/dL — ABNORMAL LOW (ref 30.0–36.0)
MCV: 106.1 fL — ABNORMAL HIGH (ref 80.0–100.0)
Platelets: 249 10*3/uL (ref 150–400)
RBC: 3.43 MIL/uL — ABNORMAL LOW (ref 4.22–5.81)
RDW: 16.7 % — ABNORMAL HIGH (ref 11.5–15.5)
WBC: 14.1 10*3/uL — ABNORMAL HIGH (ref 4.0–10.5)
nRBC: 0 % (ref 0.0–0.2)

## 2020-01-02 LAB — GLUCOSE, CAPILLARY
Glucose-Capillary: 285 mg/dL — ABNORMAL HIGH (ref 70–99)
Glucose-Capillary: 258 mg/dL — ABNORMAL HIGH (ref 70–99)
Glucose-Capillary: 293 mg/dL — ABNORMAL HIGH (ref 70–99)
Glucose-Capillary: 291 mg/dL — ABNORMAL HIGH (ref 70–99)
Glucose-Capillary: 253 mg/dL — ABNORMAL HIGH (ref 70–99)

## 2020-01-02 LAB — BASIC METABOLIC PANEL
Anion gap: 10 (ref 5–15)
BUN: 48 mg/dL — ABNORMAL HIGH (ref 8–23)
CO2: 41 mmol/L — ABNORMAL HIGH (ref 22–32)
Calcium: 8.2 mg/dL — ABNORMAL LOW (ref 8.9–10.3)
Chloride: 96 mmol/L — ABNORMAL LOW (ref 98–111)
Creatinine, Ser: 0.75 mg/dL (ref 0.61–1.24)
GFR calc Af Amer: >60 mL/min (ref >60)
GFR calc non Af Amer: >60 mL/min (ref >60)
Glucose, Bld: 300 mg/dL — ABNORMAL HIGH (ref 70–99)
Potassium: 5.4 mmol/L — ABNORMAL HIGH (ref 3.5–5.1)
Sodium: 147 mmol/L — ABNORMAL HIGH (ref 135–145)

## 2020-01-02 LAB — MAGNESIUM: Magnesium: 2.7 mg/dL — ABNORMAL HIGH (ref 1.7–2.4)

## 2020-01-02 LAB — PHOSPHORUS: Phosphorus: 2.6 mg/dL (ref 2.5–4.6)

## 2020-01-02 LAB — FERRITIN: Ferritin: 481 ng/mL — ABNORMAL HIGH (ref 24–336)

## 2020-01-02 MED ORDER — STERILE WATER FOR INJECTION IJ SOLN
INTRAMUSCULAR | Status: AC
  Administered 2020-01-02: 13:00:00 8.5 mL
  Filled 2020-01-02: qty 10

## 2020-01-02 MED ORDER — DOCUSATE SODIUM 50 MG/5ML PO LIQD: 100.0000 mg | Freq: Two times a day (BID) | ORAL | Status: DC | PRN

## 2020-01-02 MED ORDER — INSULIN ASPART 100 UNIT/ML ~~LOC~~ SOLN
6.0000 [IU] | SUBCUTANEOUS | Status: DC
  Administered 2020-01-02 – 2020-01-04 (×7): 6 [IU] via SUBCUTANEOUS

## 2020-01-02 MED ORDER — INSULIN ASPART 100 UNIT/ML ~~LOC~~ SOLN
0.0000 [IU] | SUBCUTANEOUS | Status: DC
  Administered 2020-01-02 (×2): 11 [IU] via SUBCUTANEOUS
  Administered 2020-01-03 (×2): 7 [IU] via SUBCUTANEOUS
  Administered 2020-01-03: 11 [IU] via SUBCUTANEOUS
  Administered 2020-01-04: 4 [IU] via SUBCUTANEOUS
  Administered 2020-01-04: 05:00:00 3 [IU] via SUBCUTANEOUS

## 2020-01-02 MED ORDER — STERILE WATER FOR INJECTION IJ SOLN
INTRAMUSCULAR | Status: AC
  Filled 2020-01-02: qty 10

## 2020-01-02 MED ORDER — SODIUM CHLORIDE 0.9 % IV SOLN
1.0000 mg/kg/h | INTRAVENOUS | Status: DC
  Administered 2020-01-03 – 2020-01-04 (×5): 1 mg/kg/h via INTRAVENOUS
  Filled 2020-01-02 (×11): qty 5

## 2020-01-02 MED ORDER — METOPROLOL TARTRATE 25 MG/10 ML ORAL SUSPENSION
25.0000 mg | Freq: Two times a day (BID) | ORAL | Status: DC
  Administered 2020-01-02: 25 mg
  Filled 2020-01-02 (×2): qty 10

## 2020-01-02 MED ORDER — SODIUM CHLORIDE 0.9 % IV SOLN
0.0000 ug/kg/min | INTRAVENOUS | Status: DC
  Administered 2020-01-02: 3 ug/kg/min via INTRAVENOUS
  Administered 2020-01-03: 4.501 ug/kg/min via INTRAVENOUS
  Administered 2020-01-03: 2 ug/kg/min via INTRAVENOUS
  Filled 2020-01-02 (×4): qty 20

## 2020-01-02 MED ORDER — SENNOSIDES 8.8 MG/5ML PO SYRP: 5.0000 mL | ORAL_SOLUTION | Freq: Every evening | ORAL | Status: DC | PRN

## 2020-01-02 NOTE — Progress Notes (Signed)
.   NAMESostenes [name], MRN:  [id number], DOB:  [birth year], LOS: 67 ADMISSION DATE:  11/22/2019, CONSULTATION DATE:  12/31 REFERRING MD:  Sherral Hammers, CHIEF COMPLAINT:  Progressive respiratory failure    Brief History   64 year old non-English-speaking male admitted on 12/17 with COVID PNA, transferred to the intensive care on 12/31 with acute decompensation.    Completed 5 days of remdesivir Completed 10 days of systemic steroids Transfused with convalescent plasma Received Actemra Never able to titrate off high flow oxygen Empiric antibiotics initiated on the 27th for presumptive aspiration Treated for serratia HCAP PEEP/FIO2 & plateau pressures have remained high, no real change with prone therapy.  Past Medical History  Type 2 diabetes poorly controlled Spanish-speaking only   Significant Hospital Events   12/17 Admitted.  Started on remdesivir, and systemic steroids 12/20 Transferred out of ICU to PCU 12/21 Received 1 unit of convalescent plasma, Actemra x1; still needing high flow oxygen  12/27 Asymmetric airspace disease --> presumptive aspiration event. Unasyn started.  12/31 Progressive WOB, saturations in 70s with respiratory rate in the 40s still on high flow oxygen at 40 L transferred emergently to intensive care intubated initial plateau pressure 52 1/01 Required norepinephrine, had decreased PaO2 while proned. FiO2 0.90, PEEP 12 1/04 Vt decreased to 6 cc/kg-->pplat 32 1/05 Plateau pressures 34.  Requiring frequent hourly NMB, neuromuscular drip infusion reinitiated 1/06 Hypoglycemia requiring D10, P/F ratio <150, proning protocol continued.  Left foot cold to palpation however did have strong posterior pedal and tibial pulses.  Empiric IV heparin started given concern for potential reduced arterial flow.  There is borderline ST elevation in lateral leads twelve-lead EKG, given maximized oxygen and PEEP requirements not a candidate for any intervention at this point.  1/07  Cardiac enzymes negative.  Foot now warm. 1/08 Added hydralazine and increased beta blocker  1/12 Long family discussion regarding plan of care with son, two daughters.  Did not tolerate NMB break 1/15 NMB stopped, PRN initiated, ketamine dosing increased  1/16 intermittent desaturation in 100%/PEEP of 14  Consults:  PCCM 12/31  SLP   Procedures:  ETT 12/31 >> Right IJ TLC 12/31 >> Radial A-line 12/31 >> removed RUE PICC 1/14 >>   Significant Diagnostic Tests:  CTA chest 12/18 >> Neg for PE, progressive bilateral lung opacities. Multifocal ground-glass opacities with superimposed consolidations in the dependent lower lobes with air bronchograms. Pattern consistent with COVID-19 pneumonia, superimposed bacterial infection in the lower lobes is also considered. Coronary artery calcifications. Echocardiogram 1/1 >> LVEF 89-21%, grade 1 diastolic dysfunction, moderately elevated PASP with globally normal RV function  Micro Data:  POC COVID 12/17 >> positive BCx2 12/17 >> negative  MRSA screen 12/18 >> negative Resp culture 12/31 >> Moderate Serratia Marcecens >> S- ceftriaxone, R-cefazolin  Antimicrobials:  Unasyn 12/27 >> 12/31 Cefepime 12/31 >> 1/3  Vanc 12/31 >> 1/2 Remdesivir 12/27 through 12/31  Interim history/subjective:   Remains critically ill, intubated 100%/PEEP of 16 Requiring intermittent paralytic per RN Diuresed well with Lasix  Objective   Blood pressure 103/66, pulse (!) 102, temperature 98.6 F (37 C), temperature source Oral, resp. rate (!) 45, height 5\' 5"  (1.651 m), weight 84.8 kg, SpO2 (!) 89 %.    Vent Mode: PRVC FiO2 (%):  [100 %] 100 % Set Rate:  [35 bmp] 35 bmp Vt Set:  [370 mL] 370 mL PEEP:  [16 cmH20] 16 cmH20 Plateau Pressure:  [43 cmH20-48 cmH20] 46 cmH20   Intake/Output Summary (Last 24 hours) at 01/02/2020  1615 Last data filed at 01/02/2020 1400 Gross per 24 hour  Intake 2478 ml  Output 3555 ml  Net -1077 ml   Filed Weights    12/31/19 0443 01/01/20 0500 01/02/20 0500  Weight: 84.6 kg 86.2 kg 84.8 kg    Examination: General: adult male lying in bed on vent, critically ill appearing HEENT: MM pink/moist, oral ETT, no icterus, scleral edema, no JVD Neuro: RASS -5, synchronous with vent CV: s1s2 RRR, no m/r/g PULM: No accessory muscle use, lungs bilaterally diminished but equal breath sounds GI: soft, bsx4 active  Extremities: warm/dry, 2+ edema, feet warm with equal pulses Skin: Rash over legs improved  CXR 1/16 personally reviewed-unchanged bilateral multifocal infiltrates  Labs show persistent hyperkalemia, hyperglycemia, mild leukocytosis  Resolved Hospital Problem list   Multifactorial shock off pressors as of 1/5 Serratia HCAP completed antibiotics on 1/6 Mild Thrombocytopenia Epistaxis - packing removed 1/13, no further bleeding  Concern for Pulseless Foot - doppler positive, foot warm with pulses  Assessment & Plan:   ARDS due to to COVID PNA  High Peak/plateau pressures since intubation. Prone therapy did not help with oxygenation.  -Continue lung protective ventilation, tidal volume has been lowered to 6 cc/kg but plateau pressures remain high -Use intermittent paralytic for vent synchrony -VAP prevention measures  -will need trach once FiO2/PEEP requirements decreased  Sedation Needs while on Vent  -PAD protocol with dilaudid, ketamine and versed, goal RASS -5 -Frequent paralytic dosing, resume Nimbex continuous -ct klonopin, oxycodone to 10mg  Q6   HTN -Soft BP, cardiac enzymes negative -ASA, lower metoprolol to 25 twice daily with hold parameters for soft BP   Hypernatremia Hyperkalemia -continue free water  -Continue Lasix 40 daily -Received lokelma, last 2 days -Trend BMP / urinary output -Replace electrolytes as indicated -Avoid nephrotoxic agents, ensure adequate renal perfusion   DM II with Hyperglycemia Episodes of hypoglycemia, glucoses improved off levemir  -SSI    Moderate Protein Calorie Malnutrition in setting of Prolonged Critical Illness -TF per Nutrition  -Hold laxatives due to diarrhea  Summary -prognosis poor due to severe refractory ARDS, now in third week of mechanical ventilation without any signs of progress  Best practice:  Diet: TF Pain/Anxiety/Delirium protocol (if indicated): Versed, dilaudid, ketamine, RASS goal -5 VAP protocol (if indicated): in place DVT prophylaxis: Low molecular weight heparin GI prophylaxis: PPI Glucose control: Sliding scale insulin Mobility: Bedrest Code Status: Full code Family Communication: updated 1/15 Son - 2/15, Daughter - [name] & daughter-in-law Nohemi. Yolanda Bonine (family friend->has been speaking to family in [name] for [place] and they give permission for this).  Disposition: ICU.    The patient is critically ill with multiple organ systems failure and requires high complexity decision making for assessment and support, frequent evaluation and titration of therapies, application of advanced monitoring technologies and extensive interpretation of multiple databases. Critical Care Time devoted to patient care services described in this note independent of APP/resident  time is 35 minutes.     [place] MD. Cyril Mourning. [facility]  If no response to pager , please call [phone]   01/02/2020

## 2020-01-03 LAB — CBC
HCT: 37.6 % — ABNORMAL LOW (ref 39.0–52.0)
Hemoglobin: 10.1 g/dL — ABNORMAL LOW (ref 13.0–17.0)
MCH: 29.4 pg (ref 26.0–34.0)
MCHC: 26.9 g/dL — ABNORMAL LOW (ref 30.0–36.0)
MCV: 109.3 fL — ABNORMAL HIGH (ref 80.0–100.0)
Platelets: 237 10*3/uL (ref 150–400)
RBC: 3.44 MIL/uL — ABNORMAL LOW (ref 4.22–5.81)
RDW: 16.6 % — ABNORMAL HIGH (ref 11.5–15.5)
WBC: 12.3 10*3/uL — ABNORMAL HIGH (ref 4.0–10.5)
nRBC: 0.7 % — ABNORMAL HIGH (ref 0.0–0.2)

## 2020-01-03 LAB — GLUCOSE, CAPILLARY
Glucose-Capillary: 105 mg/dL — ABNORMAL HIGH (ref 70–99)
Glucose-Capillary: 109 mg/dL — ABNORMAL HIGH (ref 70–99)
Glucose-Capillary: 127 mg/dL — ABNORMAL HIGH (ref 70–99)
Glucose-Capillary: 139 mg/dL — ABNORMAL HIGH (ref 70–99)
Glucose-Capillary: 141 mg/dL — ABNORMAL HIGH (ref 70–99)
Glucose-Capillary: 204 mg/dL — ABNORMAL HIGH (ref 70–99)
Glucose-Capillary: 226 mg/dL — ABNORMAL HIGH (ref 70–99)
Glucose-Capillary: 243 mg/dL — ABNORMAL HIGH (ref 70–99)
Glucose-Capillary: 258 mg/dL — ABNORMAL HIGH (ref 70–99)
Glucose-Capillary: 265 mg/dL — ABNORMAL HIGH (ref 70–99)
Glucose-Capillary: 315 mg/dL — ABNORMAL HIGH (ref 70–99)
Glucose-Capillary: 83 mg/dL (ref 70–99)
Glucose-Capillary: 95 mg/dL (ref 70–99)

## 2020-01-03 LAB — MAGNESIUM: Magnesium: 2.4 mg/dL (ref 1.7–2.4)

## 2020-01-03 LAB — BASIC METABOLIC PANEL
Anion gap: 5 (ref 5–15)
BUN: 41 mg/dL — ABNORMAL HIGH (ref 8–23)
CO2: 45 mmol/L — ABNORMAL HIGH (ref 22–32)
Calcium: 8.4 mg/dL — ABNORMAL LOW (ref 8.9–10.3)
Chloride: 99 mmol/L (ref 98–111)
Creatinine, Ser: 0.45 mg/dL — ABNORMAL LOW (ref 0.61–1.24)
GFR calc Af Amer: >60 mL/min (ref >60)
GFR calc non Af Amer: >60 mL/min (ref >60)
Glucose, Bld: 91 mg/dL (ref 70–99)
Potassium: 5 mmol/L (ref 3.5–5.1)
Sodium: 149 mmol/L — ABNORMAL HIGH (ref 135–145)

## 2020-01-03 LAB — PHOSPHORUS: Phosphorus: 1.7 mg/dL — ABNORMAL LOW (ref 2.5–4.6)

## 2020-01-03 MED ORDER — DEXTROSE 50 % IV SOLN
1.0000 | INTRAVENOUS | Status: DC | PRN
  Administered 2020-01-03: 50 mL via INTRAVENOUS
  Filled 2020-01-03: qty 50

## 2020-01-03 MED ORDER — PANCRELIPASE (LIP-PROT-AMYL) 12000-38000 UNITS PO CPEP
12000.0000 [IU] | ORAL_CAPSULE | Freq: Once | ORAL | Status: AC
  Administered 2020-01-03: 15:00:00 12000 [IU] via ORAL
  Filled 2020-01-03: qty 1

## 2020-01-03 MED ORDER — SODIUM BICARBONATE 650 MG PO TABS
650.0000 mg | ORAL_TABLET | Freq: Once | ORAL | Status: AC
  Administered 2020-01-03: 15:00:00 650 mg via ORAL
  Filled 2020-01-03: qty 1

## 2020-01-03 MED ORDER — SODIUM BICARBONATE 650 MG PO TABS: 650.0000 mg | ORAL_TABLET | Freq: Once | ORAL | Status: DC

## 2020-01-03 MED ORDER — ALBUMIN HUMAN 25 % IV SOLN
12.5000 g | Freq: Once | INTRAVENOUS | Status: AC
  Administered 2020-01-03: 12.5 g via INTRAVENOUS
  Filled 2020-01-03: qty 50

## 2020-01-03 MED ORDER — FUROSEMIDE 10 MG/ML IJ SOLN: 40.0000 mg | Freq: Every day | INTRAMUSCULAR | Status: DC

## 2020-01-03 MED ORDER — PANCRELIPASE (LIP-PROT-AMYL) 10440-39150 UNITS PO TABS: 20880.0000 [IU] | ORAL_TABLET | Freq: Once | ORAL | Status: DC

## 2020-01-03 MED ORDER — FUROSEMIDE 10 MG/ML IJ SOLN
40.0000 mg | Freq: Two times a day (BID) | INTRAMUSCULAR | Status: DC
  Administered 2020-01-03 (×2): 40 mg via INTRAVENOUS
  Filled 2020-01-03 (×2): qty 4

## 2020-01-03 NOTE — Progress Notes (Addendum)
RN paged oncall hospitalist due to NG clog despite efforts to unclog. TF held currently waiting on orders to place new NG.  RN paged Hospitalist again due to clogged NG and patients BG dropping. RN ordered to give D50 not and check BG @30  for 4 checks and space them out after that.

## 2020-01-03 NOTE — Progress Notes (Signed)
.   NAMEMung [name], MRN:  [id number], DOB:  Jul 22, 1956, LOS: 45 ADMISSION DATE:  11/30/2019, CONSULTATION DATE:  12/31 REFERRING MD:  Sherral Hammers, CHIEF COMPLAINT:  Progressive respiratory failure    Brief History   64 year old non-English-speaking male admitted on 12/17 with COVID PNA, transferred to the intensive care on 12/31 with acute decompensation.    Completed 5 days of remdesivir Completed 10 days of systemic steroids Transfused with convalescent plasma Received Actemra Never able to titrate off high flow oxygen Empiric antibiotics initiated on the 27th for presumptive aspiration Treated for serratia HCAP PEEP/FIO2 & plateau pressures have remained high, no real change with prone therapy.  Past Medical History  Type 2 diabetes poorly controlled Spanish-speaking only   Significant Hospital Events   12/17 Admitted.  Started on remdesivir, and systemic steroids 12/20 Transferred out of ICU to PCU 12/21 Received 1 unit of convalescent plasma, Actemra x1; still needing high flow oxygen  12/27 Asymmetric airspace disease --> presumptive aspiration event. Unasyn started.  12/31 Progressive WOB, saturations in 70s with respiratory rate in the 40s still on high flow oxygen at 40 L transferred emergently to intensive care intubated initial plateau pressure 52 1/01 Required norepinephrine, had decreased PaO2 while proned. FiO2 0.90, PEEP 12 1/04 Vt decreased to 6 cc/kg-->pplat 32 1/05 Plateau pressures 34.  Requiring frequent hourly NMB, neuromuscular drip infusion reinitiated 1/06 Hypoglycemia requiring D10, P/F ratio <150, proning protocol continued.  Left foot cold to palpation however did have strong posterior pedal and tibial pulses.  Empiric IV heparin started given concern for potential reduced arterial flow.  There is borderline ST elevation in lateral leads twelve-lead EKG, given maximized oxygen and PEEP requirements not a candidate for any intervention at this point.  1/07  Cardiac enzymes negative.  Foot now warm. 1/08 Added hydralazine and increased beta blocker  1/12 Long family discussion regarding plan of care with son, two daughters.  Did not tolerate NMB break 1/15 NMB stopped, PRN initiated, ketamine dosing increased  1/16 intermittent desaturation in 100%/PEEP of 14  1/18 letter settings unchanged.  Consults:  PCCM 12/31  SLP   Procedures:  ETT 12/31 >> Right IJ TLC 12/31 >> Radial A-line 12/31 >> removed RUE PICC 1/14 >>   Significant Diagnostic Tests:  CTA chest 12/18 >> Neg for PE, progressive bilateral lung opacities. Multifocal ground-glass opacities with superimposed consolidations in the dependent lower lobes with air bronchograms. Pattern consistent with COVID-19 pneumonia, superimposed bacterial infection in the lower lobes is also considered. Coronary artery calcifications. Echocardiogram 1/1 >> LVEF 38-75%, grade 1 diastolic dysfunction, moderately elevated PASP with globally normal RV function  Micro Data:  POC COVID 12/17 >> positive BCx2 12/17 >> negative  MRSA screen 12/18 >> negative Resp culture 12/31 >> Moderate Serratia Marcecens >> S- ceftriaxone, R-cefazolin  Antimicrobials:  Unasyn 12/27 >> 12/31 Cefepime 12/31 >> 1/3  Vanc 12/31 >> 1/2 Remdesivir 12/27 through 12/31  Interim history/subjective:   Periods of dyssynchrony with desaturation overnight requiring occasional increases in paralytic.  Objective   Blood pressure 111/71, pulse (!) 119, temperature 100 F (37.8 C), temperature source Axillary, resp. rate (!) 35, height 5\' 5"  (1.651 m), weight 84.8 kg, SpO2 91 %.    Vent Mode: PRVC FiO2 (%):  [80 %-100 %] 80 % Set Rate:  [35 bmp] 35 bmp Vt Set:  [370 mL] 370 mL PEEP:  [14 cmH20-16 cmH20] 14 cmH20 Plateau Pressure:  [41 cmH20-49 cmH20] 41 cmH20   Intake/Output Summary (Last 24 hours) at  01/03/2020 1328 Last data filed at 01/03/2020 1151 Gross per 24 hour  Intake 1560.42 ml  Output 2900 ml  Net  -1339.58 ml   Filed Weights   12/31/19 0443 01/01/20 0500 01/02/20 0500  Weight: 84.6 kg 86.2 kg 84.8 kg    Examination: General: adult male lying in bed on vent, critically ill appearing HEENT: MM pink/moist, oral ETT, no icterus, scleral edema, no JVD Neuro: RASS -5, synchronous with vent CV: s1s2 RRR, no m/r/g PULM: No accessory muscle use, lungs bilaterally diminished but equal breath sounds GI: soft, bsx4 active  Extremities: warm/dry, 2+ edema throughout., feet warm with equal pulses Skin: Rash over legs improved  I performed a bedside echocardiogram which showed normal LV size and function, no significant valvular lesion, mild aortic stenosis.  Normal cardiac output.  Normal left ventricular filling pressures despite grade 1 diastolic dysfunction.  Moderate pulmonary hypertension with RVSP of 50.  IVC normal caliber with significant respiratory variation.  Resolved Hospital Problem list   Multifactorial shock off pressors as of 1/5 Serratia HCAP completed antibiotics on 1/6 Mild Thrombocytopenia Epistaxis - packing removed 1/13, no further bleeding  Concern for Pulseless Foot - doppler positive, foot warm with pulses  Assessment & Plan:   Critically ill due to ARDS due to to COVID PNA  High Peak/plateau pressures since intubation. Prone therapy did not help with oxygenation.  -Continue lung protective ventilation, tidal volume has been lowered to 6 cc/kg but plateau pressures remain high -Use intermittent paralytic for vent synchrony-titrated to clinical dyssynchrony. -will need trach once FiO2/PEEP requirements decreased  Anasarca with low intravascular filling pressures. Albumin facilitated diuresis  Best practice:  Diet: High nutritional risk with moderate malnutrition TF Pain/Anxiety/Delirium protocol (if indicated): Versed, dilaudid, ketamine, RASS goal -5.  Lighten sedation once off paralytic VAP protocol (if indicated): in place Ventilator goal: Wean paralytic  dose.  Titrate to clinical dyssynchrony.  Once off paralytic begin to wean FiO2 and PEEP. Blood pressure goal: Keep MAP greater than 65.  Fluid goals: Albumin administration followed by diuresis. DVT prophylaxis: Low molecular weight heparin GI prophylaxis: PPI Glucose control: Sliding scale insulin currently euglycemic Mobility: Bedrest Code Status: DNR Family Communication: [name] Grammes updated 1/15 Son - Yolanda Bonine, Daughter - [name] & daughter-in-law Nohemi. [name] (family friend->has been speaking to family in [place] for [place] and they give permission for this).  Overall prognosis is guarded.  Patient is a DNR. Disposition: ICU.    CRITICAL CARE Performed by: Lynnell Catalan   Total critical care time: 40 minutes  Critical care time was exclusive of separately billable procedures and treating other patients.  Critical care was necessary to treat or prevent imminent or life-threatening deterioration.  Critical care was time spent personally by me on the following activities: development of treatment plan with patient and/or surrogate as well as nursing, discussions with consultants, evaluation of patient's response to treatment, examination of patient, obtaining history from patient or surrogate, ordering and performing treatments and interventions, ordering and review of laboratory studies, ordering and review of radiographic studies, pulse oximetry, re-evaluation of patient's condition and participation in multidisciplinary rounds.  Lynnell Catalan, MD [facility] ICU Physician [facility]  Pager: [phone] Mobile: [phone] After hours: [phone].    01/03/2020

## 2020-01-04 ENCOUNTER — Inpatient Hospital Stay (HOSPITAL_COMMUNITY): Payer: HRSA Program

## 2020-01-04 LAB — GLUCOSE, CAPILLARY
Glucose-Capillary: 147 mg/dL — ABNORMAL HIGH (ref 70–99)
Glucose-Capillary: 163 mg/dL — ABNORMAL HIGH (ref 70–99)

## 2020-01-04 LAB — MAGNESIUM: Magnesium: 2.2 mg/dL (ref 1.7–2.4)

## 2020-01-04 LAB — PHOSPHORUS: Phosphorus: 2.4 mg/dL — ABNORMAL LOW (ref 2.5–4.6)

## 2020-01-04 MED ORDER — CIPROFLOXACIN IN D5W 400 MG/200ML IV SOLN
400.0000 mg | Freq: Two times a day (BID) | INTRAVENOUS | Status: DC
  Administered 2020-01-04: 400 mg via INTRAVENOUS
  Filled 2020-01-04: qty 200

## 2020-01-04 MED ORDER — VANCOMYCIN HCL 1500 MG/300ML IV SOLN
1500.0000 mg | Freq: Once | INTRAVENOUS | Status: DC
  Filled 2020-01-04: qty 300

## 2020-01-04 MED ORDER — VANCOMYCIN HCL 750 MG/150ML IV SOLN: 750.0000 mg | Freq: Two times a day (BID) | INTRAVENOUS | Status: DC

## 2020-01-04 MED ORDER — SODIUM CHLORIDE 0.9 % IV SOLN
1.0000 g | Freq: Three times a day (TID) | INTRAVENOUS | Status: DC
  Administered 2020-01-04: 05:00:00 1 g via INTRAVENOUS
  Filled 2020-01-04 (×2): qty 1

## 2020-01-06 LAB — CULTURE, RESPIRATORY W GRAM STAIN: Special Requests: NORMAL

## 2020-01-17 NOTE — Progress Notes (Addendum)
[facility] PCCM  Asked to evaluate for hypoxemia, worsening O2 saturation Net negative over the last few days On evaluation: synchronous with ventilator CXR shows worsening airspace disease and R pleural effusion over the last four days Agree with checking CVP and diuresis but given net negative over the last few days and worsening CXR and high likelihood of secondary infection will check resp culture and start nosocomial pneumonia coverage  Consider bedside echo in AM> PE? He has very advanced disease, treatment options are limited.  Heber [place], MD [place] PCCM Pager: [phone] Cell: [phone] If no response, call [phone]

## 2020-01-17 NOTE — Progress Notes (Signed)
eLink Physician-Brief Progress Note Patient Name: Henry [name] DOB: 1956-12-10 MRN: [id number]   Date of Service  January 24, 2020  HPI/Events of Note  Hypoxia - Review of CXR reveals diffuse opacities c/w COVID pneumonia. Relative sparing of LUL. Nursing reports that he does better with L lung dependent. Sat onw = 88% on 100%/P 14.  eICU Interventions  Will order: 1. Increase PEEP to 16. 2. L lung dependent when possible.      Intervention Category Major Interventions: Hypoxemia - evaluation and management  Lasya Vetter [place] 01/24/2020, 2:00 AM

## 2020-01-17 NOTE — Progress Notes (Signed)
Sputum sent to lab

## 2020-01-17 NOTE — Death Summary Note (Signed)
DEATH SUMMARY   Patient Details  Name: Henry MoorsJesus Gencarelli MRN: [id number] DOB: 1956-07-14  Admission/Discharge Information   Admit Date:  11/23/2019  Date of Death: Date of Death: 12-Nov-2020  Time of Death: Time of Death: 0555  Length of Stay: 33  Referring Physician: Patient, No Pcp Per   Reason(s) for Hospitalization  COVID 19  Diagnoses  Preliminary cause of death:   COVID 19 Secondary Diagnoses (including complications and co-morbidities):  Active Problems:   Acute respiratory failure with hypoxia (HCC)   Acute respiratory failure due to COVID-19 (HCC)   Hyperglycemia   Acute hypoxemic respiratory failure (HCC)   Pneumonia due to COVID-19 virus   Elevated liver enzymes   Diabetes mellitus type 2, uncontrolled, with complications (HCC)   ARDS (adult respiratory distress syndrome) (HCC)   Difficult intravenous access   Pressure injury of skin   Pneumonia due to Serratia marcescens [facility](HCC)   Hypertension   Brief Hospital Course (including significant findings, care, treatment, and services provided and events leading to death)  Odie Carlynn Purlerez is a 64 year old male admitted on 12/17 with COVID PNA, transferred to the intensive care on 12/31 with acute decompensation.    Completed 5 days of remdesivir Completed 10 days of systemic steroids Transfused with convalescent plasma Received Actemra Never able to titrate off high flow oxygen Empiric antibiotics initiated on the 27th for presumptive aspiration Treated for serratia HCAP PEEP/FIO2 & plateau pressures have remained high, no real change with prone therapy.  Hospital course: 12/17 Admitted.  Started on remdesivir, and systemic steroids 12/20 Transferred out of ICU to PCU 12/21 Received 1 unit of convalescent plasma, Actemra x1; still needing high flow oxygen  12/27 Asymmetric airspace disease --> presumptive aspiration event. Unasyn started.  12/31 Progressive WOB, saturations in 70s with respiratory rate in the 40s still  on high flow oxygen at 40 L transferred emergently to intensive care intubated initial plateau pressure 52 1/01 Required norepinephrine, had decreased PaO2 while proned. FiO2 0.90, PEEP 12 1/04 Vt decreased to 6 cc/kg-->pplat 32 1/05 Plateau pressures 34.  Requiring frequent hourly NMB, neuromuscular drip infusion reinitiated 1/06 Hypoglycemia requiring D10, P/F ratio <150, proning protocol continued.  Left foot cold to palpation however did have strong posterior pedal and tibial pulses.  Empiric IV heparin started given concern for potential reduced arterial flow.  There is borderline ST elevation in lateral leads twelve-lead EKG, given maximized oxygen and PEEP requirements not a candidate for any intervention at this point.  1/07 Cardiac enzymes negative.  Foot now warm. 1/08 Added hydralazine and increased beta blocker  1/12 Long family discussion regarding plan of care with son, two daughters.  Did not tolerate NMB break 1/15 NMB stopped, PRN initiated, ketamine dosing increased  1/16 intermittent desaturation in 100%/PEEP of 14  1/18 ventilator settings unchanged.   On 1/19 he developed worsening hypoxemia, R sided infiltrate, and increased secretions.  The ventilator was adjusted and antibiotics started but his condition deteriorated and he passed in the setting of profound hypoxemia and bradycardia.     Pertinent Labs and Studies  Significant Diagnostic Studies DG Abd 1 View  Result Date: 01/02/2020 CLINICAL DATA:  Abdominal distension. EXAM: ABDOMEN - 1 VIEW COMPARISON:  December 07, 2007. FINDINGS: The bowel gas pattern is normal. Distal tip of feeding tube is seen in expected position of distal stomach or proximal duodenum. No radio-opaque calculi or other significant radiographic abnormality are seen. IMPRESSION: No evidence of bowel obstruction or ileus. Distal tip of feeding tube seen in  expected position of distal stomach or proximal duodenum. Electronically Signed   By: Marijo Conception M.D.   On: 01/02/2020 08:53   DG CHEST PORT 1 VIEW  Result Date: 01/09/2020 CLINICAL DATA:  Hypoxia EXAM: PORTABLE CHEST 1 VIEW COMPARISON:  01/02/2020 FINDINGS: Endotracheal tube tip is at the level of the clavicular heads. Orogastric tube courses beyond the field of view. Right PICC line tip is in unchanged position at the cavoatrial junction. Diffuse bilateral interstitial opacity with small right pleural effusion. IMPRESSION: Unchanged appearance of the chest with diffuse bilateral interstitial opacities and small right pleural effusion. Electronically Signed   By: Ulyses Jarred M.D.   On: 2020/01/09 02:19   DG Chest Port 1 View  Result Date: 01/02/2020 CLINICAL DATA:  Acute respiratory failure. EXAM: PORTABLE CHEST 1 VIEW COMPARISON:  December 31, 2019. FINDINGS: Stable cardiomediastinal silhouette. Endotracheal and feeding tubes are unchanged in position. Stable diffuse bilateral lung opacities are noted consistent with multifocal pneumonia. Bony thorax is unremarkable. IMPRESSION: Stable support apparatus. Stable bilateral multifocal pneumonia. Electronically Signed   By: Marijo Conception M.D.   On: 01/02/2020 08:54   DG Chest Port 1 View  Result Date: 12/31/2019 CLINICAL DATA:  Acute respiratory failure with hypoxia. EXAM: PORTABLE CHEST 1 VIEW COMPARISON:  12/30/2019 FINDINGS: Enteric tube courses into the region of the duodenum and off the film as tip is not visualized. Right IJ central venous catheter right-sided PICC line unchanged. Small caliber catheter is present over the mid esophagus. Endotracheal tube has tip 3 cm above the carina. Lungs are somewhat hypoinflated with stable hazy bilateral airspace process with which may be due to multifocal infection versus edema. No effusion. Cardiomediastinal silhouette and remainder of the exam is unchanged. IMPRESSION: 1. Stable hazy bilateral airspace process likely multifocal infection versus edema. 2.  Tubes and lines as described.  Electronically Signed   By: Marin Olp M.D.   On: 12/31/2019 07:51   DG CHEST PORT 1 VIEW  Result Date: 12/30/2019 CLINICAL DATA:  Order for PICC line EXAM: PORTABLE CHEST 1 VIEW COMPARISON:  Radiograph 12/30/2019 FINDINGS: Endotracheal tube tip terminates in the mid trachea. Transesophageal temperature probe terminates in the midthoracic esophagus. Transesophageal feeding tube tip terminates beyond the expected location of the duodenal bulb. A right IJ catheter tip terminates at the superior cavoatrial junction. The basilar predominant mixed interstitial and airspace opacity throughout both lungs is unchanged from comparison. No pneumothorax or effusion. No acute osseous or soft tissue abnormality. Degenerative changes are present in the imaged spine and shoulders. IMPRESSION: 1. Unchanged appearance of the chest. 2. Right IJ catheter tip terminates at the superior cavoatrial junction. 3. Endotracheal tube terminates in the mid trachea. 4. Transesophageal feeding tube tip terminates beyond the expected location of the duodenal bulb. Electronically Signed   By: Lovena Le M.D.   On: 12/30/2019 20:54   DG Chest Port 1 View  Result Date: 12/30/2019 CLINICAL DATA:  Acute respiratory failure with hypoxia. EXAM: PORTABLE CHEST 1 VIEW COMPARISON:  December 28, 2019. FINDINGS: The heart size and mediastinal contours are within normal limits. Endotracheal and feeding tubes are unchanged in position. Right internal jugular catheter is unchanged. No pneumothorax or pleural effusion is noted. Stable bilateral multiple lung opacities are noted consistent with pneumonia. The visualized skeletal structures are unremarkable. IMPRESSION: Stable support apparatus. Stable bilateral multiple lung opacities are noted consistent with pneumonia. Electronically Signed   By: Marijo Conception M.D.   On: 12/30/2019 07:47   DG  CHEST PORT 1 VIEW  Result Date: 12/28/2019 CLINICAL DATA:  Acute respiratory failure. EXAM: PORTABLE  CHEST 1 VIEW COMPARISON:  12/24/2019.  12/20/2019. FINDINGS: Endotracheal tube, feeding tube, right IJ line stable position. Heart size normal. Diffuse bilateral pulmonary infiltrates progressed from prior exam. No pleural effusion or pneumothorax. Previously questioned pneumomediastinum is not visualized on today's exam. IMPRESSION: 1.  Lines and tubes in stable position. 2. Diffuse bilateral pulmonary infiltrates progressed from prior exam. 3. Previously question pneumomediastinum is not visualized on today's exam. Electronically Signed   By: Maisie Fus  Register   On: 12/28/2019 07:42   DG Chest Port 1 View  Result Date: 12/24/2019 CLINICAL DATA:  64 year old male with history of although respiratory distress syndrome. EXAM: PORTABLE CHEST 1 VIEW COMPARISON:  Chest x-ray 12/20/2019. FINDINGS: An endotracheal tube is in place with tip 2.4 cm above the carina. There is a right-sided internal jugular central venous catheter with tip terminating in the superior vena cava. A feeding tube is seen extending into the abdomen, however, the tip of the feeding tube extends below the lower margin of the image. Patchy asymmetrically distributed ill-defined opacities and areas of interstitial prominence noted throughout the lungs bilaterally with relative sparing of the left upper lobe. Aeration has slightly improved in the left mid to lower lung but worsened in the right upper lobe compared to the prior examination. No pleural effusions. Pulmonary vasculature is obscured. Heart size is mildly enlarged. The patient is rotated to the right on today's exam, resulting in distortion of the mediastinal contours and reduced diagnostic sensitivity and specificity for mediastinal pathology. Unusual lucency outlining the left mediastinal border and left side of the heart, which could represent pneumomediastinum. IMPRESSION: 1. Support apparatus, as above. 2. Findings are concerning for potential pneumomediastinum. Attention on follow-up  studies is recommended. 3. Patchy multilobar bilateral pneumonia redemonstrated with slight progression in the right upper lobe and slight improvement throughout the left mid to lower lung. 4. Mild cardiomegaly. Electronically Signed   By: Trudie Reed M.D.   On: 12/24/2019 11:56   DG Chest Port 1 View  Result Date: 12/20/2019 CLINICAL DATA:  COVID positive EXAM: PORTABLE CHEST 1 VIEW COMPARISON:  12/16/2019 FINDINGS: Endotracheal tube is 5 cm above the carina. Right IJ central line tip overlies the SVC. Enteric tube passes into the distal stomach. Persistent elevation of the right hemidiaphragm. Persistent left greater than right opacities. Aeration is improved at the right lung base. No significant pleural effusion or pneumothorax. There is right greater than left chest wall and lower neck subcutaneous emphysema. IMPRESSION: Lines and tubes as above. Persistent left greater than right pulmonary opacities. Improved aeration at the right lung base. New chest wall and lower neck subcutaneous emphysema. Electronically Signed   By: Guadlupe Spanish M.D.   On: 12/20/2019 09:49   DG Chest Port 1 View  Result Date: 12/17/2019 CLINICAL DATA:  COVID-19. EXAM: PORTABLE CHEST 1 VIEW COMPARISON:  December 16, 2019. FINDINGS: Stable cardiomediastinal silhouette. Endotracheal and feeding tubes are unchanged in position. Right internal jugular catheter is unchanged. No pneumothorax is noted. Stable bilateral lung opacities are noted concerning for multifocal pneumonia. Bony thorax is unremarkable. IMPRESSION: Stable support apparatus. Stable bilateral lung opacities are noted concerning for multifocal pneumonia. Electronically Signed   By: Lupita Raider M.D.   On: 12/17/2019 09:21   DG CHEST PORT 1 VIEW  Result Date: 12/16/2019 CLINICAL DATA:  Endotracheal tube and hypoxia. EXAM: PORTABLE CHEST 1 VIEW COMPARISON:  Radiographs earlier this day, most  recently 1828 hour. FINDINGS: Borderline low positioning of the  endotracheal tube with tip 11 mm from the carina. Weighted enteric tube tip below the diaphragm not included in the field of view. Right central line unchanged in the mid-lower SVC. Diffuse bilateral heterogeneous lung opacities, with equivocal worsening in the right lung over the past 3 hours. No pneumothorax or other interval change. IMPRESSION: 1. Borderline low positioning of the endotracheal tube with tip 11 mm from the carina. 2. Diffuse bilateral heterogeneous lung opacities, with equivocal worsening in the right lung over the past 3 hours. Exam otherwise unchanged. Electronically Signed   By: Narda Rutherford M.D.   On: 12/16/2019 22:18   DG Chest Port 1 View  Result Date: 12/16/2019 CLINICAL DATA:  Respiratory distress EXAM: PORTABLE CHEST 1 VIEW COMPARISON:  12/16/2019 at 1518 hours FINDINGS: Endotracheal tube terminates approximately 5.0 cm superior to the carina. Enteric tube terminates within the distal stomach. Right IJ central venous catheter terminates at the level of the superior cavoatrial junction. Heart size is stable. Lung volumes are low. Dense bibasilar airspace consolidations, left worse than right without significant interval change from previous study. No pneumothorax. IMPRESSION: 1. No significant interval change in dense bibasilar airspace consolidations, left worse than right. 2. Stable support apparatus. 3. No pneumothorax. Electronically Signed   By: Duanne Guess D.O.   On: 12/16/2019 19:45   DG CHEST PORT 1 VIEW  Result Date: 12/16/2019 CLINICAL DATA:  Encounter for intubation EXAM: PORTABLE CHEST 1 VIEW COMPARISON:  Chest radiograph 12/16/2019 FINDINGS: Interval intubation with endotracheal tube tip terminating approximately 2.5 cm above the carina. Interval placement of a right central venous catheter with tip projecting at the cavoatrial junction. An orogastric tube remains in place. Unchanged appearance of the lungs with bilateral diffuse airspace opacities. No  pneumothorax. IMPRESSION: Appropriate positioning of the endotracheal tube and right central venous catheter. Electronically Signed   By: Emmaline Kluver M.D.   On: 12/16/2019 15:55   DG CHEST PORT 1 VIEW  Result Date: 12/16/2019 CLINICAL DATA:  Respiratory distress EXAM: PORTABLE CHEST 1 VIEW COMPARISON:  12/12/2019 FINDINGS: Feeding tube with tip in the stomach. Chronic elevation LEFT hemidiaphragm. Stable cardiac silhouette. There is diffuse fine airspace disease in the lower lobes slightly increased in density. Low lung volumes. IMPRESSION: 1. Overall minimal interval change. 2. Fine diffuse airspace disease may be slightly increased. 3. Low lung volumes elevation LEFT hemidiaphragm. Electronically Signed   By: Genevive Bi M.D.   On: 12/16/2019 12:39   DG CHEST PORT 1 VIEW  Result Date: 12/12/2019 CLINICAL DATA:  Aspiration. EXAM: PORTABLE CHEST 1 VIEW COMPARISON:  12/09/2019 FINDINGS: Cardiopericardial silhouette is at upper limits of normal for size. Stable asymmetric elevation right hemidiaphragm. Persistent interstitial and patchy airspace opacity in the mid and lower lungs bilaterally, but disease in the right base is slightly progressed. No substantial pleural effusion. A feeding tube passes into the stomach although the distal tip position is not included on the film. IMPRESSION: Slight progression of patchy airspace disease at the right base. Otherwise no substantial interval change. Electronically Signed   By: Kennith Center M.D.   On: 12/12/2019 14:19   DG CHEST PORT 1 VIEW  Result Date: 12/09/2019 CLINICAL DATA:  Shortness of breath EXAM: PORTABLE CHEST 1 VIEW COMPARISON:  With 12/04/2019 FINDINGS: Stable mild elevation of the right hemidiaphragm. Low lung volumes. Patchy bilateral airspace opacities are again noted, slightly improved on the right since prior study. Heart is normal size. No visible effusions  or acute bony abnormality. IMPRESSION: Very low lung volumes.  Mild  elevation of the right hemidiaphragm. Patchy bilateral airspace disease, improved on the right since prior study. Electronically Signed   By: Charlett NoseKevin  Dover M.D.   On: 12/09/2019 10:51   ECHOCARDIOGRAM COMPLETE  Result Date: 12/17/2019   ECHOCARDIOGRAM REPORT   Patient Name:   Henry MoorsJESUS Gruel Date of Exam: 12/17/2019 Medical Rec #:  [id number]   Height:       65.0 in Accession #:    [id number]  Weight:       163.8 lb Date of Birth:  [birth year]   BSA:          1.82 m Patient Age:    63 years    BP:           102/72 mmHg Patient Gender: M           HR:           92 bpm. Exam Location:  Inpatient Procedure: 2D Echo, Color Doppler and Cardiac Doppler Indications:    Acute Respiratory Insufficiency R06.89  History:        Patient has no prior history of Echocardiogram examinations.                 Risk Factors:Diabetes. COVID+ on 11/30/19.  Sonographer:    Irving BurtonEmily Senior RDCS Referring Phys: [id number] PETER E BABCOCK IMPRESSIONS  1. Left ventricular ejection fraction, by visual estimation, is 60 to 65%. The left ventricle has normal function. There is borderline left ventricular hypertrophy.  2. Left ventricular diastolic parameters are consistent with Grade I diastolic dysfunction (impaired relaxation).  3. The left ventricle has no regional wall motion abnormalities.  4. Global right ventricle has normal systolic function.The right ventricular size is normal. No increase in right ventricular wall thickness.  5. Left atrial size was normal.  6. Right atrial size was normal.  7. Presence of pericardial fat pad.  8. The mitral valve is grossly normal. Trivial mitral valve regurgitation.  9. The tricuspid valve is grossly normal. 10. The aortic valve is tricuspid. Aortic valve regurgitation is not visualized. 11. The pulmonic valve was grossly normal. Pulmonic valve regurgitation is trivial. 12. Moderately elevated pulmonary artery systolic pressure. 13. The tricuspid regurgitant velocity is 2.84 m/s, and with an assumed right atrial  pressure of 8 mmHg, the estimated right ventricular systolic pressure is moderately elevated at 40.3 mmHg. FINDINGS  Left Ventricle: Left ventricular ejection fraction, by visual estimation, is 60 to 65%. The left ventricle has normal function. The left ventricle has no regional wall motion abnormalities. There is borderline left ventricular hypertrophy. Left ventricular diastolic parameters are consistent with Grade I diastolic dysfunction (impaired relaxation). Right Ventricle: The right ventricular size is normal. No increase in right ventricular wall thickness. Global RV systolic function is has normal systolic function. The tricuspid regurgitant velocity is 2.84 m/s, and with an assumed right atrial pressure  of 8 mmHg, the estimated right ventricular systolic pressure is moderately elevated at 40.3 mmHg. Left Atrium: Left atrial size was normal in size. Right Atrium: Right atrial size was normal in size Pericardium: There is no evidence of pericardial effusion. Presence of pericardial fat pad. Mitral Valve: The mitral valve is grossly normal. Trivial mitral valve regurgitation. Tricuspid Valve: The tricuspid valve is grossly normal. Tricuspid valve regurgitation is mild. Aortic Valve: The aortic valve is tricuspid. Aortic valve regurgitation is not visualized. Mild aortic valve annular calcification. Pulmonic Valve: The pulmonic valve was grossly normal. Pulmonic  valve regurgitation is trivial. Pulmonic regurgitation is trivial. Aorta: The aortic root is normal in size and structure. IAS/Shunts: No atrial level shunt detected by color flow Doppler.  LEFT VENTRICLE PLAX 2D LVIDd:         4.39 cm  Diastology LVIDs:         2.71 cm  LV e' lateral:   6.74 cm/s LV PW:         1.01 cm  LV E/e' lateral: 9.1 LV IVS:        0.86 cm  LV e' medial:    5.00 cm/s LVOT diam:     1.80 cm  LV E/e' medial:  12.2 LV SV:         60 ml LV SV Index:   32.18 LVOT Area:     2.54 cm  RIGHT VENTRICLE RV S prime:     18.80 cm/s TAPSE  (M-mode): 1.8 cm LEFT ATRIUM             Index       RIGHT ATRIUM           Index LA diam:        2.80 cm 1.54 cm/m  RA Area:     20.00 cm LA Vol (A2C):   47.6 ml 26.19 ml/m RA Volume:   60.50 ml  33.29 ml/m LA Vol (A4C):   32.7 ml 17.99 ml/m LA Biplane Vol: 43.5 ml 23.94 ml/m  AORTIC VALVE LVOT Vmax:   122.00 cm/s LVOT Vmean:  84.400 cm/s LVOT VTI:    0.205 m  AORTA Ao Root diam: 3.30 cm MITRAL VALVE                        TRICUSPID VALVE MV Area (PHT): 3.08 cm             TR Peak grad:   32.3 mmHg MV PHT:        71.34 msec           TR Vmax:        284.00 cm/s MV Decel Time: 246 msec MV E velocity: 61.10 cm/s 103 cm/s  SHUNTS MV A velocity: 74.70 cm/s 70.3 cm/s Systemic VTI:  0.20 m MV E/A ratio:  0.82       1.5       Systemic Diam: 1.80 cm  Nona Dell MD Electronically signed by Nona Dell MD Signature Date/Time: 12/17/2019/11:52:09 AM    Final    [place] EKG SITE RITE  Result Date: 12/29/2019 If Site Rite image not attached, placement could not be confirmed due to current cardiac rhythm.  US Abdomen Limited RUQ  Result Date: 12/13/2019 CLINICAL DATA:  Inpatient. COVID-19 pneumonia. Elevated liver enzymes. EXAM: ULTRASOUND ABDOMEN LIMITED RIGHT UPPER QUADRANT COMPARISON:  None. FINDINGS: Very limited scan due to bowel gas. Gallbladder: No gallstones or wall thickening visualized. No sonographic Murphy sign noted by sonographer. Common bile duct: Not discretely visualized. No intrahepatic biliary ductal dilatation demonstrated. Liver: Liver parenchyma is diffusely mildly echogenic. No definite liver surface irregularity. No liver mass is detected, acute noting decreased sensitivity in the setting of an echogenic liver. Portal vein is patent on color Doppler imaging with normal direction of blood flow towards the liver. Other: None. IMPRESSION: 1. Limited scan. Mildly echogenic liver parenchyma, a nonspecific finding that can be seen with hepatic steatosis or hepatic fibrosis. Consider outpatient  hepatic elastography for further liver fibrosis risk stratification, as clinically warranted. 2. Normal gallbladder,  with no cholelithiasis. 3. No intrahepatic biliary ductal dilatation demonstrated. CBD not discretely visualized. If patient bilirubin levels are elevated, MRI/MRCP may be considered for further evaluation. Electronically Signed   By: Delbert Phenix M.D.   On: 12/13/2019 16:20    Microbiology No results found for this or any previous visit (from the past 240 hour(s)).  Lab Basic Metabolic Panel: Recent Labs  Lab 12/30/19 0500 12/31/19 0420 01/01/20 0449 01/02/20 0440 01/03/20 0500  NA 137 140 141 147* 149*  K 4.8 5.0 5.3* 5.4* 5.0  CL 94* 94* 96* 96* 99  CO2 36* 38* 39* 41* 45*  GLUCOSE 276* 157* 286* 300* 91  BUN 28* 44* 48* 48* 41*  CREATININE 0.89 1.20 0.89 0.75 0.45*  CALCIUM 7.6* 7.6* 7.9* 8.2* 8.4*  MG 2.2 2.5* 2.7* 2.7* 2.4  PHOS 4.0 5.8* 2.9 2.6 1.7*   Liver Function Tests: No results for input(s): AST, ALT, ALKPHOS, BILITOT, PROT, ALBUMIN in the last 168 hours. No results for input(s): LIPASE, AMYLASE in the last 168 hours. No results for input(s): AMMONIA in the last 168 hours. CBC: Recent Labs  Lab 12/30/19 0500 12/31/19 0420 01/01/20 0449 01/02/20 0440 01/03/20 0500  WBC 7.8 9.4 11.2* 14.1* 12.3*  HGB 11.5* 11.1* 10.1* 10.1* 10.1*  HCT 37.9* 37.5* 35.7* 36.4* 37.6*  MCV 96.9 99.7 102.6* 106.1* 109.3*  PLT 166 189 229 249 237   Cardiac Enzymes: No results for input(s): CKTOTAL, CKMB, CKMBINDEX, TROPONINI in the last 168 hours. Sepsis Labs: Recent Labs  Lab 12/31/19 0420 01/01/20 0449 01/02/20 0440 01/03/20 0500  WBC 9.4 11.2* 14.1* 12.3*    Procedures/Operations  ETT 12/31 >> Right IJ TLC 12/31 >> Radial A-line 12/31 >> removed RUE PICC 1/14 >>    Max Fickle 2020-02-01, 6:41 AM

## 2020-01-17 NOTE — Progress Notes (Signed)
eLink Physician-Brief Progress Note Patient Name: Henry [name] DOB: 03-18-1956 MRN: [id number]   Date of Service  12/21/2019  HPI/Events of Note  Hypoxia - Sat = 83% on 100%/P 14.   eICU Interventions  Will order: 1. Portable CXR STAT.     Intervention Category Major Interventions: Hypoxemia - evaluation and management  [name] 01/07/2020, 1:21 AM

## 2020-01-17 NOTE — Progress Notes (Signed)
Patient became hypoxemic with sats as low as 77% on PRVC, 100% FiO2; PEEP 14; MD notified since patient was not dyssynchronous with the ventilator at the time and CPOT was 0; Dr. Arsenio Loader ordered a CXR at that time; see imaging for interpretation; Dr. Arsenio Loader then ordered to increase the PEEP and keep left lung dependent 2/2 improved sats; patient was turned with his left lung down and sats improved to 88%; about an hour later, still laying with left lung down, the patient desated once again, sitting around 80-82%; CVP was 11 at this time; he was also noted to be more tachy to the 130's NSR; patient found to be febrile to 39.2 (TMAX); Dr. Kendrick Fries ordered a [facility] and started patient on ABX for HAP; tylenol given for fever; left lung dependent; oxygen saturations remain at 82% on 100% FiO2, PEEP 16; Dr. Arsenio Loader and Dr. Kendrick Fries made aware; will continue to monitor closely.

## 2020-01-17 NOTE — Progress Notes (Signed)
[facility]  Patient passed earlier Have attempted to call his contact locally as well as his family in [place] Have been unable to reach both.  Heber Gumlog, MD [place] [facility] Pager: [phone] Cell: [phone] If no response, call [phone]

## 2020-01-17 NOTE — Progress Notes (Signed)
Pharmacy Antibiotic Note  Henry [name] is a 64 y.o. male admitted on Dec 20, 2019 with COVID-19 pneumonia.  Pharmacy has been consulted for Meropenem and Cefepime dosing for suspected HCAP.  He previously completed antibiotics for Serratia marcescens pneumonia.  Plan: Meropenem 1g IV q8h Vancomycin 1500 mg IV x1 then 750 mg IV q12h. Measure Vanc peak and trough at steady state. Goal AUC = 400 - 550.  Expected AUC 481 using rounded SCr 0.8 and Vd 0.5 L/kg Follow up renal function, culture results, and clinical course.    Height: 5\' 5"  (165.1 cm) Weight: 186 lb 15.2 oz (84.8 kg) IBW/kg (Calculated) : 61.5  Temp (24hrs), Avg:99.3 F (37.4 C), Min:98.4 F (36.9 C), Max:100 F (37.8 C)  Recent Labs  Lab 12/30/19 0500 12/31/19 0420 01/01/20 0449 01/02/20 0440 01/03/20 0500  WBC 7.8 9.4 11.2* 14.1* 12.3*  CREATININE 0.89 1.20 0.89 0.75 0.45*    Estimated Creatinine Clearance: 95.9 mL/min (A) (by C-G formula based on SCr of 0.45 mg/dL (L)).    Not on File  Antimicrobials this admission: Remdesivir 12/17 >> 12/21 Actemra 12/21 Convalescent Plasma 12/21 12/27 Unasyn>>12/31 12/31 Vanc >> 1/2, resumed 1/19 >>  12/31 Cefepime >> 1/3 1/3 Ceftriaxone >> 1/8 1/19 Cipro >>  1/19 Meropenem >>   Dose adjustments this admission:   Microbiology results: 12/17 BCx: NGF 12/18 MRSA PCR: neg 12/31 TA: moderate Serratia marcescens (pan-sens to all tested except cefazolin) 1/19 TA:   Thank you for allowing pharmacy to be a part of this patient's care.  2/19 PharmD, BCPS Clinical pharmacist phone 7am- 5pm: [phone] 12/29/2019 4:28 AM

## 2020-01-17 NOTE — Final Progress Note (Signed)
RT came to bedside when patient sating in the 70's to increase PEEP and perform recruitment maneuvers per Dr. Arsenio Loader; after recruitment patient became more hypoxic to ow 60's and eventually down to 30's; RT's bagged the patient and patient became bradycardic to the 20's and eventually asystole on the monitor; death confirmed by this RN and Vernetta Honey, RN per MD order; Versed , Dilaudid , Ketamine 110 mls, wasted at this time with Vernetta Honey RN.   Unable to reach family by phone- tried all 3 numbers listed in the chart

## 2020-01-17 NOTE — Progress Notes (Signed)
eLink Physician-Brief Progress Note Patient Name: Henry [name] DOB: 1956-09-19 MRN: [id number]   Date of Service  Jan 23, 2020  HPI/Events of Note  Hypoxia - Sat now = 76.   eICU Interventions  Will order: 1. Increase PEEP to 18. 2. Please have RT do recruitment maneuvers PRN.     Intervention Category Major Interventions: Hypoxemia - evaluation and management  Adaliah Hiegel [place] 2020/01/23, 5:43 AM

## 2020-01-17 NOTE — Plan of Care (Signed)
Dr. Arsenio Loader ordered STAT CXR for hypoxia; CN called XR to help facilitate- still waiting.

## 2020-01-17 NOTE — Progress Notes (Signed)
Patient now sating in the 70's; as low as 73%; Dr. Arsenio Loader ordered recruitment maneuvers as well as increasing PEEP to 18; RT at bedside.

## 2020-01-17 NOTE — Final Progress Note (Deleted)
RT came to bedside when patient sating in the 70's to increase PEEP and perform recruitment maneuvers per Dr. Sommer; after recruitment patient became more hypoxic to ow 60's and eventually down to 30's; RT's bagged the patient and patient became bradycardic to the 20's and eventually asystole on the monitor; death confirmed by this RN and Bransyn Adami, RN per MD order; Versed 37mls, Dilaudid 75mls, Ketamine 110 mls, wasted at this time with Princesa Willig RN.   Unable to reach family by phone- tried all 3 numbers listed in the chart 

## 2020-01-17 NOTE — Progress Notes (Signed)
E-Link contacted via phone about patient's oxygen saturations; waiting for response.

## 2020-01-17 DEATH — deceased

## 2021-04-04 IMAGING — DX DG CHEST 1V PORT
1 series · 1 of 1 positions shown · non-contrast
Comparison: With 12/04/2019

CLINICAL DATA: Shortness of breath

EXAM:
PORTABLE CHEST 1 VIEW

[chest]
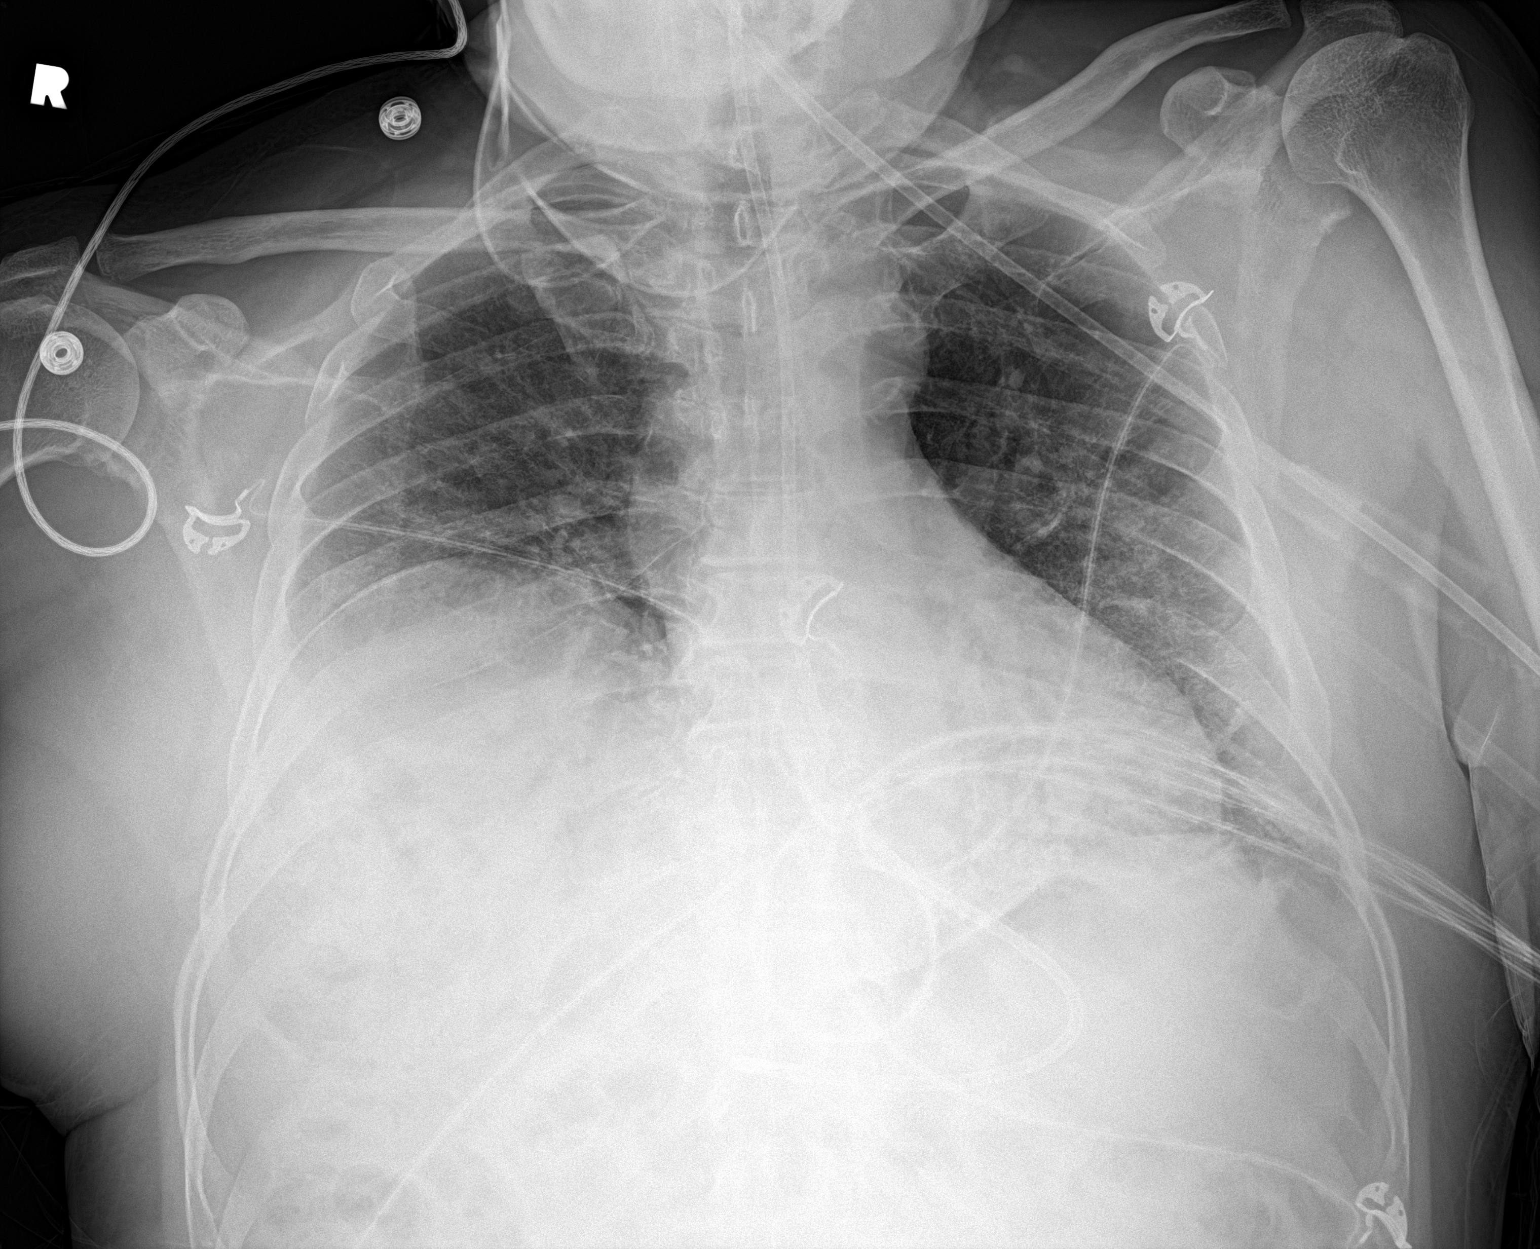

[1 of 1 positions shown; findings below may reference images not displayed]

FINDINGS: Stable mild elevation of the right hemidiaphragm. Low lung volumes.
Patchy bilateral airspace opacities are again noted, slightly
improved on the right since prior study. Heart is normal size. No
visible effusions or acute bony abnormality.
IMPRESSION: Very low lung volumes.  Mild elevation of the right hemidiaphragm.

Patchy bilateral airspace disease, improved on the right since prior
study.

## 2021-04-07 IMAGING — DX DG CHEST 1V PORT
1 series · 1 of 1 positions shown · non-contrast
Comparison: 12/09/2019

CLINICAL DATA: Aspiration.

EXAM:
PORTABLE CHEST 1 VIEW

[chest]
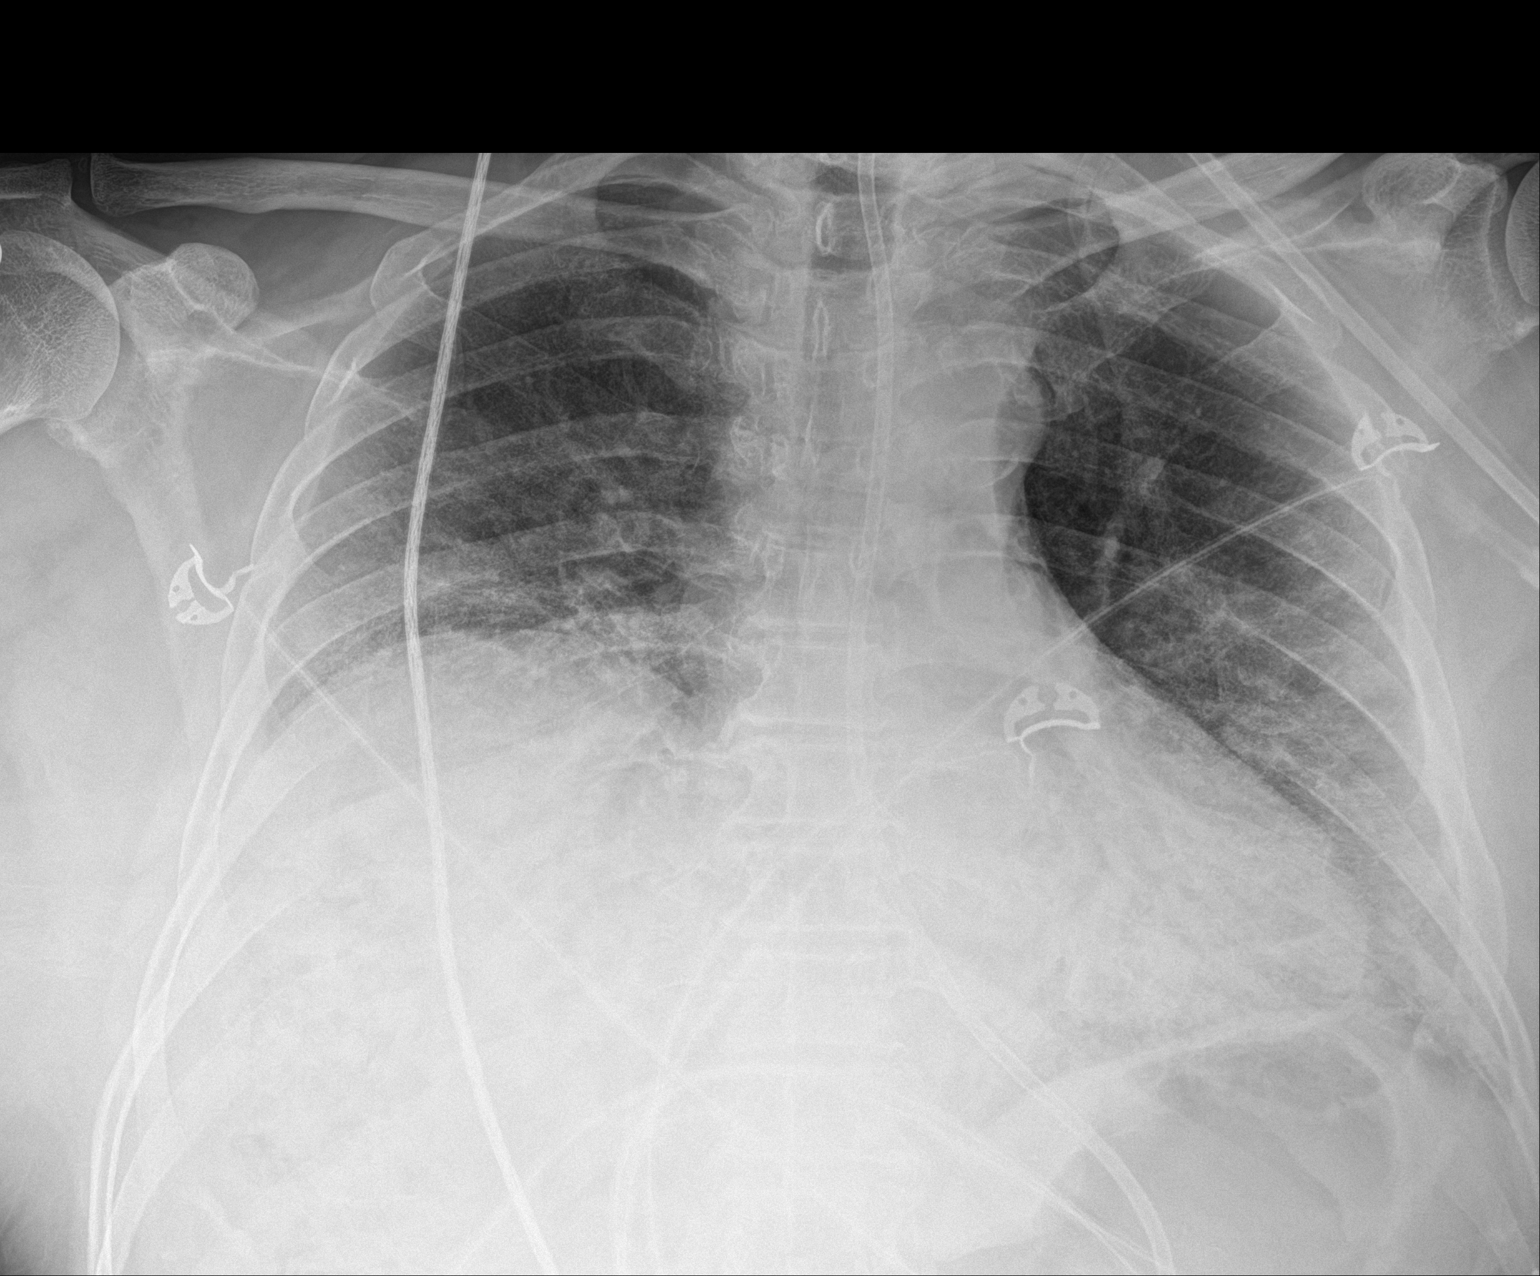

[1 of 1 positions shown; findings below may reference images not displayed]

FINDINGS: Cardiopericardial silhouette is at upper limits of normal for size.
Stable asymmetric elevation right hemidiaphragm. Persistent
interstitial and patchy airspace opacity in the mid and lower lungs
bilaterally, but disease in the right base is slightly progressed.
No substantial pleural effusion. A feeding tube passes into the
stomach although the distal tip position is not included on the
film.
IMPRESSION: Slight progression of patchy airspace disease at the right base.
Otherwise no substantial interval change.

## 2021-04-11 IMAGING — DX DG CHEST 1V PORT
1 series · 1 of 1 positions shown · non-contrast
Comparison: 12/12/2019

CLINICAL DATA: Respiratory distress

EXAM:
PORTABLE CHEST 1 VIEW

[chest]
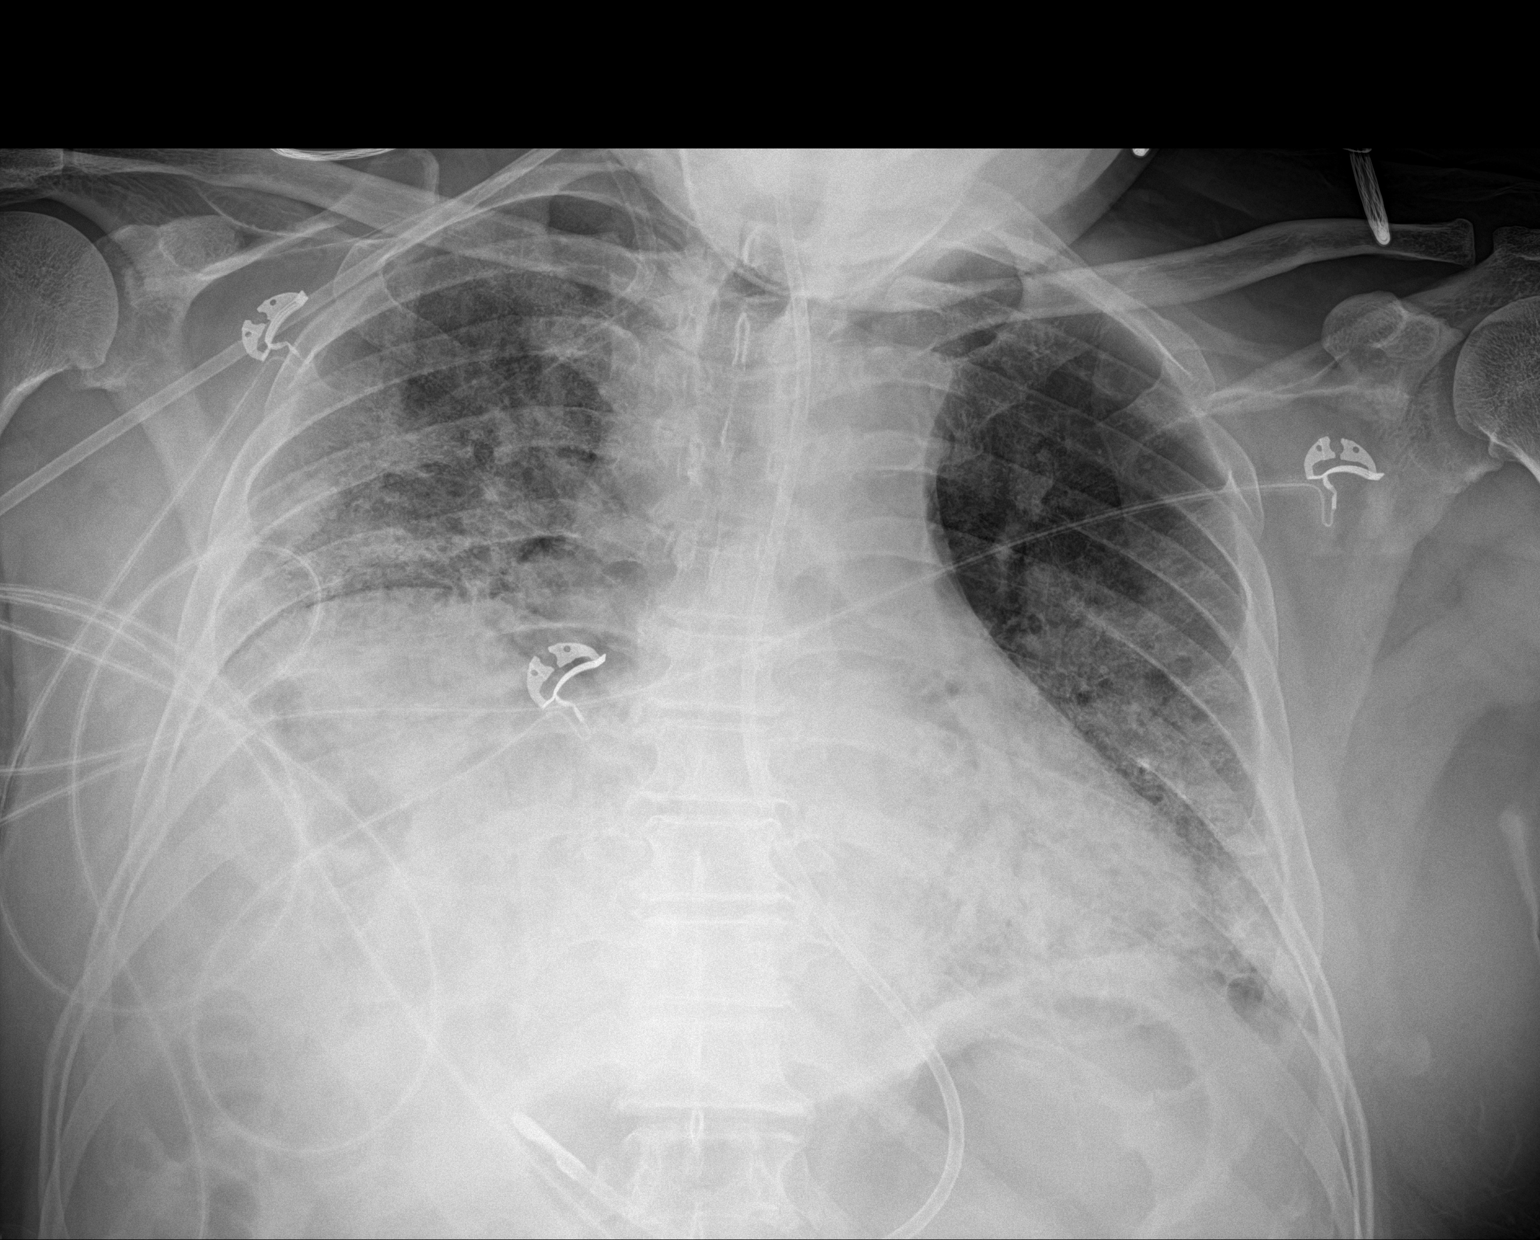

[1 of 1 positions shown; findings below may reference images not displayed]

FINDINGS: Feeding tube with tip in the stomach. Chronic elevation LEFT
hemidiaphragm. Stable cardiac silhouette. There is diffuse fine
airspace disease in the lower lobes slightly increased in density.
Low lung volumes.
IMPRESSION: 1. Overall minimal interval change.
2. Fine diffuse airspace disease may be slightly increased.
3. Low lung volumes elevation LEFT hemidiaphragm.

## 2021-04-11 IMAGING — DX DG CHEST 1V PORT
1 series · 1 of 1 positions shown · non-contrast
Comparison: Chest radiograph 12/16/2019

CLINICAL DATA: Encounter for intubation

EXAM:
PORTABLE CHEST 1 VIEW

[chest ap]
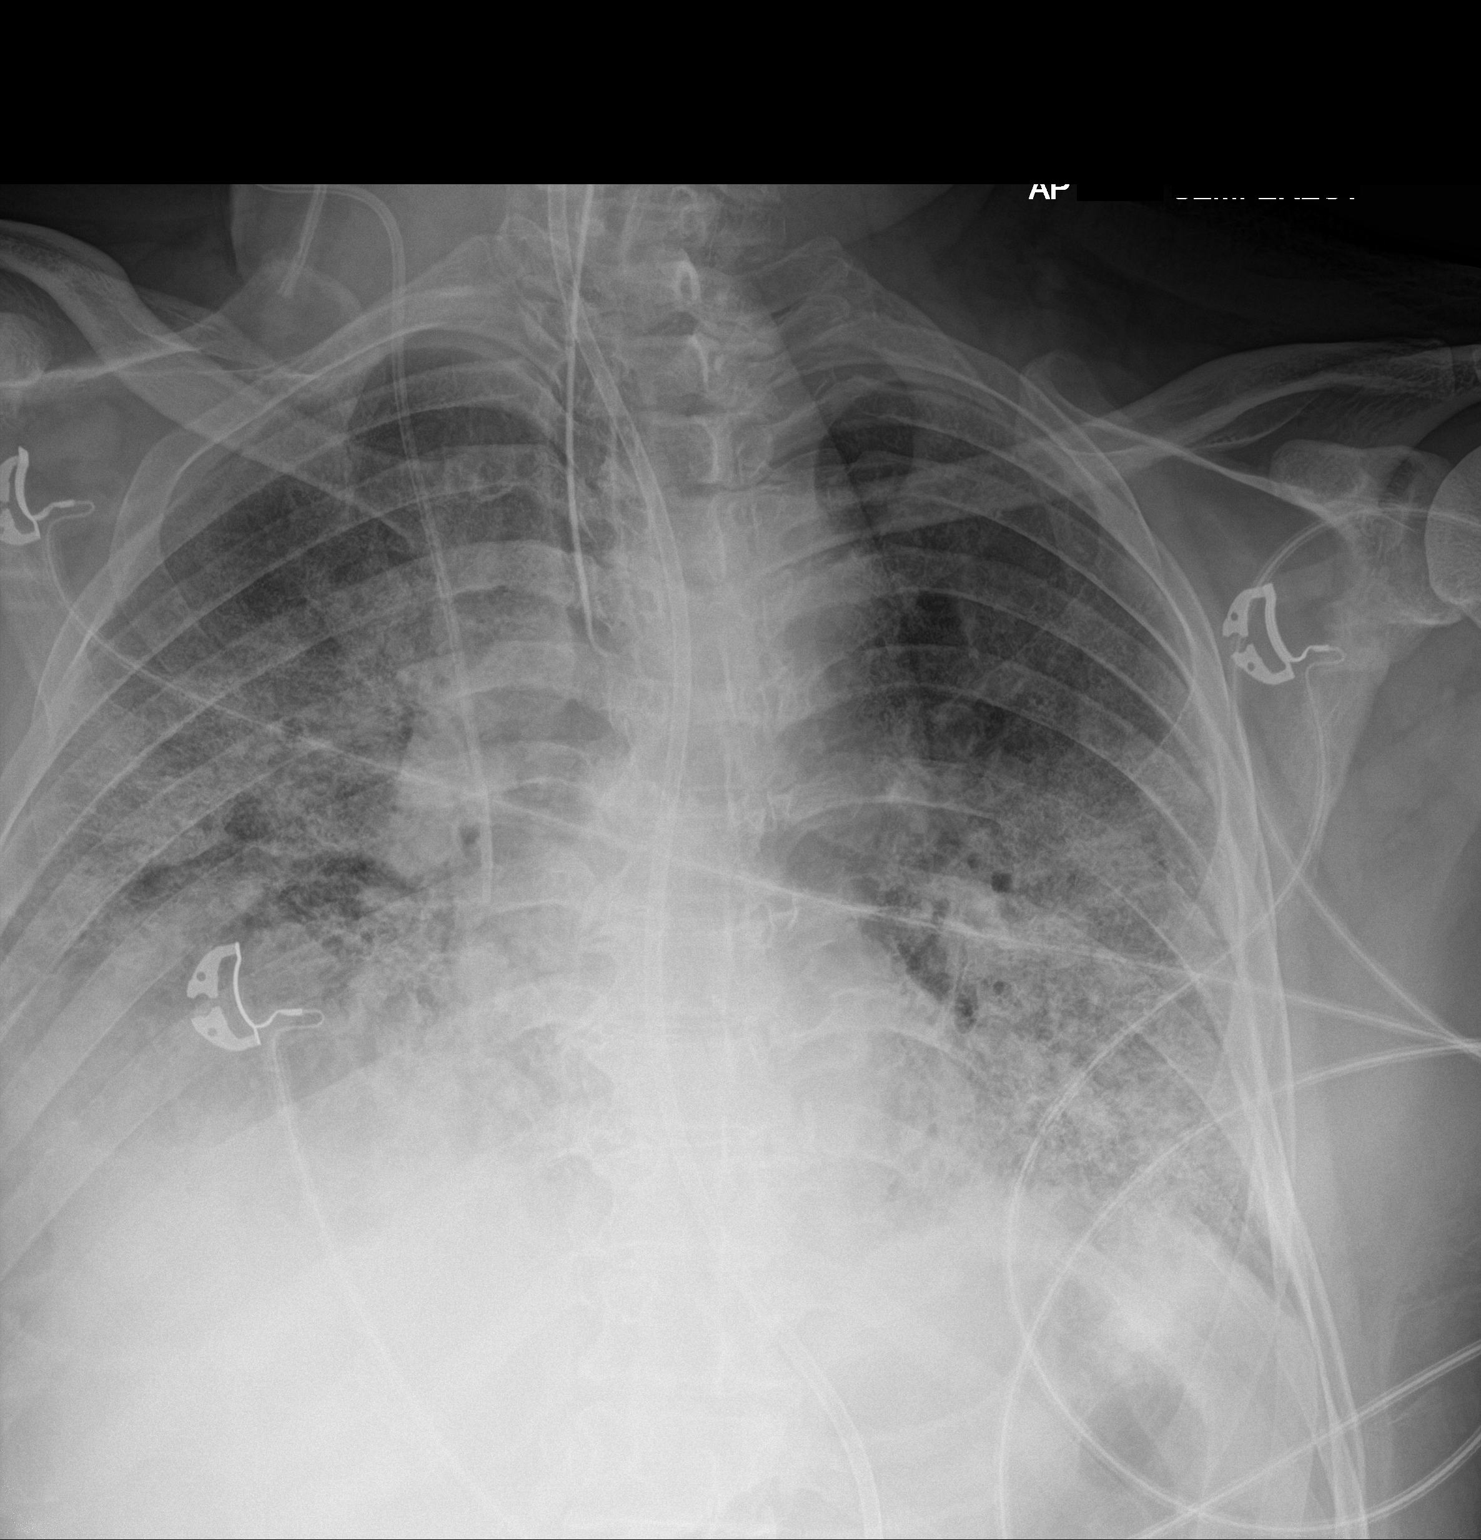

[1 of 1 positions shown; findings below may reference images not displayed]

FINDINGS: Interval intubation with endotracheal tube tip terminating
approximately 2.5 cm above the carina. Interval placement of a right
central venous catheter with tip projecting at the cavoatrial
junction. An orogastric tube remains in place.

Unchanged appearance of the lungs with bilateral diffuse airspace
opacities. No pneumothorax.
IMPRESSION: Appropriate positioning of the endotracheal tube and right central
venous catheter.

## 2021-04-12 IMAGING — DX DG CHEST 1V PORT
1 series · 1 of 1 positions shown · non-contrast
Comparison: December 16, 2019.

CLINICAL DATA: [id number].

EXAM:
PORTABLE CHEST 1 VIEW

[chest ap]
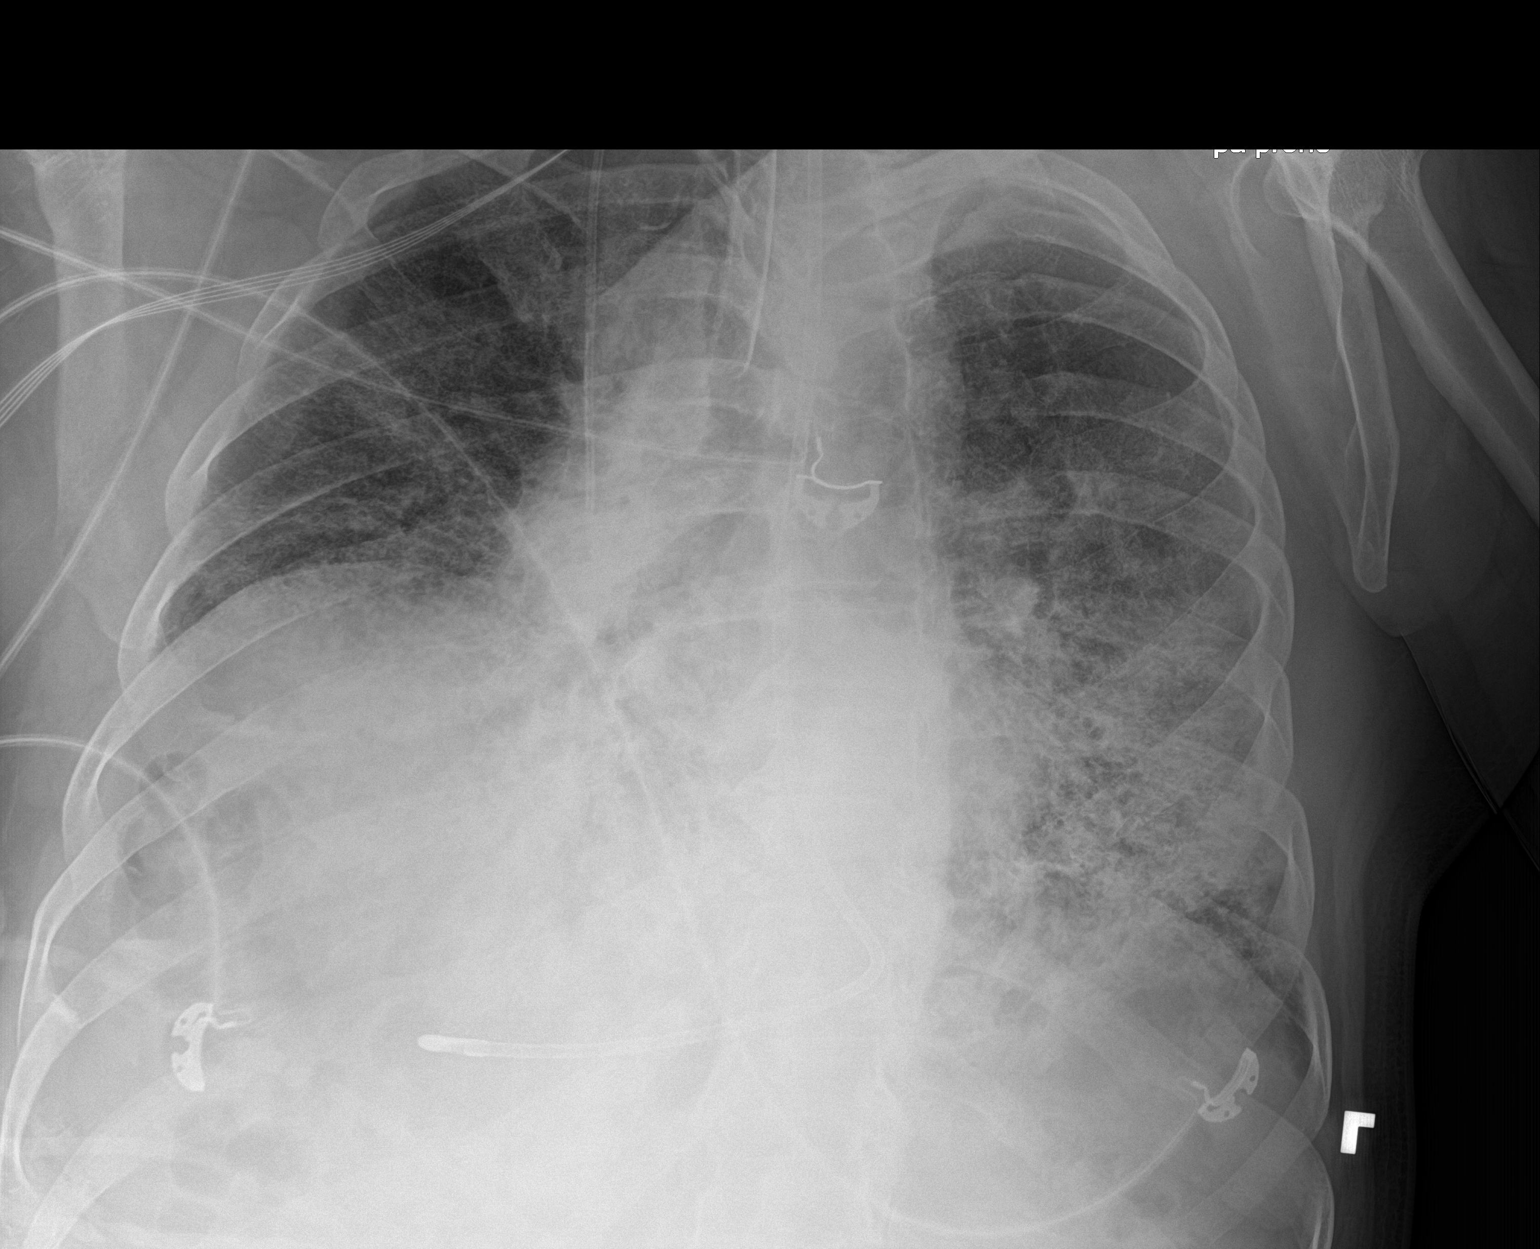

[1 of 1 positions shown; findings below may reference images not displayed]

FINDINGS: Stable cardiomediastinal silhouette. Endotracheal and feeding tubes
are unchanged in position. Right internal jugular catheter is
unchanged. No pneumothorax is noted. Stable bilateral lung opacities
are noted concerning for multifocal pneumonia. Bony thorax is
unremarkable.
IMPRESSION: Stable support apparatus. Stable bilateral lung opacities are noted
concerning for multifocal pneumonia.
